# Patient Record
Sex: Male | Born: 1935
Health system: Southern US, Community
[De-identification: ages and names within clinical notes are randomized; demographics above are authoritative.]

## PROBLEM LIST (undated history)

## (undated) DIAGNOSIS — I129 Hypertensive chronic kidney disease with stage 1 through stage 4 chronic kidney disease, or unspecified chronic kidney disease: Secondary | ICD-10-CM

## (undated) DIAGNOSIS — R011 Cardiac murmur, unspecified: Secondary | ICD-10-CM

## (undated) DIAGNOSIS — A419 Sepsis, unspecified organism: Secondary | ICD-10-CM

## (undated) DIAGNOSIS — I38 Endocarditis, valve unspecified: Secondary | ICD-10-CM

## (undated) DIAGNOSIS — E213 Hyperparathyroidism, unspecified: Secondary | ICD-10-CM

## (undated) DIAGNOSIS — Z86010 Personal history of colonic polyps: Secondary | ICD-10-CM

## (undated) DIAGNOSIS — K922 Gastrointestinal hemorrhage, unspecified: Secondary | ICD-10-CM

## (undated) DIAGNOSIS — I10 Essential (primary) hypertension: Secondary | ICD-10-CM

## (undated) DIAGNOSIS — I4891 Unspecified atrial fibrillation: Secondary | ICD-10-CM

## (undated) DIAGNOSIS — Z860101 Personal history of adenomatous and serrated colon polyps: Secondary | ICD-10-CM

## (undated) DIAGNOSIS — K5790 Diverticulosis of intestine, part unspecified, without perforation or abscess without bleeding: Secondary | ICD-10-CM

## (undated) DIAGNOSIS — E785 Hyperlipidemia, unspecified: Secondary | ICD-10-CM

## (undated) DIAGNOSIS — M1990 Unspecified osteoarthritis, unspecified site: Secondary | ICD-10-CM

## (undated) DIAGNOSIS — M109 Gout, unspecified: Secondary | ICD-10-CM

## (undated) DIAGNOSIS — D649 Anemia, unspecified: Secondary | ICD-10-CM

## (undated) HISTORY — PX: MITRAL VALVE REPLACEMENT: SHX147

## (undated) HISTORY — PX: AORTIC AND MITRAL VALVE REPLACEMENT: SHX5056

## (undated) HISTORY — DX: Cardiac murmur, unspecified: R01.1

## (undated) HISTORY — PX: CATARACT EXTRACTION: SUR2

## (undated) HISTORY — DX: Anemia, unspecified: D64.9

## (undated) HISTORY — DX: Gastrointestinal hemorrhage, unspecified: K92.2

## (undated) HISTORY — DX: Personal history of colonic polyps: Z86.010

## (undated) HISTORY — DX: Personal history of adenomatous and serrated colon polyps: Z86.0101

## (undated) HISTORY — PX: CORONARY ARTERY BYPASS GRAFT: SHX141

## (undated) HISTORY — DX: Diverticulosis of intestine, part unspecified, without perforation or abscess without bleeding: K57.90

## (undated) HISTORY — PX: OTHER SURGICAL HISTORY: SHX169

## (undated) HISTORY — DX: Unspecified osteoarthritis, unspecified site: M19.90

## (undated) HISTORY — DX: Hypertensive chronic kidney disease with stage 1 through stage 4 chronic kidney disease, or unspecified chronic kidney disease: I12.9

## (undated) HISTORY — DX: Hyperparathyroidism, unspecified: E21.3

---

## 2001-02-05 ENCOUNTER — Other Ambulatory Visit: Admission: RE | Admit: 2001-02-05 | Discharge: 2001-02-05 | Payer: Self-pay | Admitting: Internal Medicine

## 2001-09-25 ENCOUNTER — Encounter: Admission: RE | Admit: 2001-09-25 | Discharge: 2001-09-25 | Payer: Self-pay | Admitting: Cardiology

## 2001-09-25 ENCOUNTER — Encounter: Payer: Self-pay | Admitting: Cardiology

## 2003-11-22 ENCOUNTER — Ambulatory Visit (HOSPITAL_COMMUNITY): Admission: RE | Admit: 2003-11-22 | Discharge: 2003-11-22 | Payer: Self-pay | Admitting: Cardiology

## 2003-12-02 ENCOUNTER — Ambulatory Visit (HOSPITAL_COMMUNITY): Admission: RE | Admit: 2003-12-02 | Discharge: 2003-12-02 | Payer: Self-pay | Admitting: Cardiology

## 2004-06-15 ENCOUNTER — Encounter (HOSPITAL_COMMUNITY): Admission: RE | Admit: 2004-06-15 | Discharge: 2004-09-13 | Payer: Self-pay | Admitting: Family Medicine

## 2004-06-20 ENCOUNTER — Ambulatory Visit (HOSPITAL_COMMUNITY): Admission: RE | Admit: 2004-06-20 | Discharge: 2004-06-20 | Payer: Self-pay | Admitting: Family Medicine

## 2005-11-18 ENCOUNTER — Ambulatory Visit: Payer: Self-pay | Admitting: Gastroenterology

## 2005-11-18 ENCOUNTER — Inpatient Hospital Stay (HOSPITAL_COMMUNITY): Admission: EM | Admit: 2005-11-18 | Discharge: 2005-11-25 | Payer: Self-pay | Admitting: Emergency Medicine

## 2006-02-11 ENCOUNTER — Emergency Department (HOSPITAL_COMMUNITY): Admission: EM | Admit: 2006-02-11 | Discharge: 2006-02-11 | Payer: Self-pay | Admitting: *Deleted

## 2006-02-14 ENCOUNTER — Ambulatory Visit: Payer: Self-pay | Admitting: Internal Medicine

## 2007-03-27 ENCOUNTER — Ambulatory Visit (HOSPITAL_COMMUNITY): Admission: RE | Admit: 2007-03-27 | Discharge: 2007-03-27 | Payer: Self-pay | Admitting: Cardiology

## 2009-11-07 DIAGNOSIS — I330 Acute and subacute infective endocarditis: Secondary | ICD-10-CM | POA: Insufficient documentation

## 2009-11-07 DIAGNOSIS — N2581 Secondary hyperparathyroidism of renal origin: Secondary | ICD-10-CM | POA: Diagnosis present

## 2010-01-18 DIAGNOSIS — I059 Rheumatic mitral valve disease, unspecified: Secondary | ICD-10-CM | POA: Insufficient documentation

## 2010-05-24 DIAGNOSIS — B009 Herpesviral infection, unspecified: Secondary | ICD-10-CM | POA: Insufficient documentation

## 2011-10-03 DIAGNOSIS — I2510 Atherosclerotic heart disease of native coronary artery without angina pectoris: Secondary | ICD-10-CM | POA: Diagnosis present

## 2011-10-03 DIAGNOSIS — I4891 Unspecified atrial fibrillation: Secondary | ICD-10-CM | POA: Diagnosis present

## 2011-10-03 DIAGNOSIS — M1A00X0 Idiopathic chronic gout, unspecified site, without tophus (tophi): Secondary | ICD-10-CM | POA: Diagnosis present

## 2012-01-07 DIAGNOSIS — Z954 Presence of other heart-valve replacement: Secondary | ICD-10-CM | POA: Diagnosis not present

## 2012-01-07 DIAGNOSIS — Z9889 Other specified postprocedural states: Secondary | ICD-10-CM | POA: Diagnosis not present

## 2012-01-07 DIAGNOSIS — Z7901 Long term (current) use of anticoagulants: Secondary | ICD-10-CM | POA: Diagnosis not present

## 2012-01-14 DIAGNOSIS — I10 Essential (primary) hypertension: Secondary | ICD-10-CM | POA: Diagnosis not present

## 2012-01-14 DIAGNOSIS — H2510 Age-related nuclear cataract, unspecified eye: Secondary | ICD-10-CM | POA: Diagnosis not present

## 2012-01-14 DIAGNOSIS — Z961 Presence of intraocular lens: Secondary | ICD-10-CM | POA: Diagnosis not present

## 2012-01-14 DIAGNOSIS — H35039 Hypertensive retinopathy, unspecified eye: Secondary | ICD-10-CM | POA: Diagnosis not present

## 2012-01-30 DIAGNOSIS — N183 Chronic kidney disease, stage 3 unspecified: Secondary | ICD-10-CM | POA: Diagnosis not present

## 2012-01-30 DIAGNOSIS — N2581 Secondary hyperparathyroidism of renal origin: Secondary | ICD-10-CM | POA: Diagnosis not present

## 2012-01-30 DIAGNOSIS — E213 Hyperparathyroidism, unspecified: Secondary | ICD-10-CM | POA: Diagnosis not present

## 2012-01-30 DIAGNOSIS — I129 Hypertensive chronic kidney disease with stage 1 through stage 4 chronic kidney disease, or unspecified chronic kidney disease: Secondary | ICD-10-CM | POA: Diagnosis not present

## 2012-01-30 DIAGNOSIS — D649 Anemia, unspecified: Secondary | ICD-10-CM | POA: Diagnosis not present

## 2012-01-30 DIAGNOSIS — M109 Gout, unspecified: Secondary | ICD-10-CM | POA: Diagnosis not present

## 2012-02-03 DIAGNOSIS — E785 Hyperlipidemia, unspecified: Secondary | ICD-10-CM | POA: Diagnosis not present

## 2012-02-03 DIAGNOSIS — Z125 Encounter for screening for malignant neoplasm of prostate: Secondary | ICD-10-CM | POA: Diagnosis not present

## 2012-02-10 DIAGNOSIS — Z7901 Long term (current) use of anticoagulants: Secondary | ICD-10-CM | POA: Diagnosis not present

## 2012-02-10 DIAGNOSIS — N189 Chronic kidney disease, unspecified: Secondary | ICD-10-CM | POA: Diagnosis not present

## 2012-02-10 DIAGNOSIS — Z0000 Encounter for general adult medical examination without abnormal findings: Secondary | ICD-10-CM | POA: Diagnosis not present

## 2012-02-10 DIAGNOSIS — Z125 Encounter for screening for malignant neoplasm of prostate: Secondary | ICD-10-CM | POA: Diagnosis not present

## 2012-02-10 DIAGNOSIS — I10 Essential (primary) hypertension: Secondary | ICD-10-CM | POA: Diagnosis not present

## 2012-02-10 DIAGNOSIS — E785 Hyperlipidemia, unspecified: Secondary | ICD-10-CM | POA: Diagnosis not present

## 2012-02-12 DIAGNOSIS — Z1212 Encounter for screening for malignant neoplasm of rectum: Secondary | ICD-10-CM | POA: Diagnosis not present

## 2012-02-13 DIAGNOSIS — N183 Chronic kidney disease, stage 3 unspecified: Secondary | ICD-10-CM | POA: Diagnosis not present

## 2012-02-27 DIAGNOSIS — E213 Hyperparathyroidism, unspecified: Secondary | ICD-10-CM | POA: Diagnosis not present

## 2012-02-27 DIAGNOSIS — N183 Chronic kidney disease, stage 3 unspecified: Secondary | ICD-10-CM | POA: Diagnosis not present

## 2012-03-11 DIAGNOSIS — I359 Nonrheumatic aortic valve disorder, unspecified: Secondary | ICD-10-CM | POA: Diagnosis not present

## 2012-03-11 DIAGNOSIS — I059 Rheumatic mitral valve disease, unspecified: Secondary | ICD-10-CM | POA: Diagnosis not present

## 2012-03-11 DIAGNOSIS — Z954 Presence of other heart-valve replacement: Secondary | ICD-10-CM | POA: Diagnosis not present

## 2012-03-11 DIAGNOSIS — Z7901 Long term (current) use of anticoagulants: Secondary | ICD-10-CM | POA: Diagnosis not present

## 2012-04-15 DIAGNOSIS — Z954 Presence of other heart-valve replacement: Secondary | ICD-10-CM | POA: Diagnosis not present

## 2012-04-15 DIAGNOSIS — I059 Rheumatic mitral valve disease, unspecified: Secondary | ICD-10-CM | POA: Diagnosis not present

## 2012-04-15 DIAGNOSIS — Z7901 Long term (current) use of anticoagulants: Secondary | ICD-10-CM | POA: Diagnosis not present

## 2012-04-15 DIAGNOSIS — I359 Nonrheumatic aortic valve disorder, unspecified: Secondary | ICD-10-CM | POA: Diagnosis not present

## 2012-04-28 DIAGNOSIS — I059 Rheumatic mitral valve disease, unspecified: Secondary | ICD-10-CM | POA: Diagnosis not present

## 2012-04-28 DIAGNOSIS — Z954 Presence of other heart-valve replacement: Secondary | ICD-10-CM | POA: Diagnosis not present

## 2012-04-28 DIAGNOSIS — I359 Nonrheumatic aortic valve disorder, unspecified: Secondary | ICD-10-CM | POA: Diagnosis not present

## 2012-04-28 DIAGNOSIS — Z7901 Long term (current) use of anticoagulants: Secondary | ICD-10-CM | POA: Diagnosis not present

## 2012-05-27 ENCOUNTER — Emergency Department (HOSPITAL_BASED_OUTPATIENT_CLINIC_OR_DEPARTMENT_OTHER): Payer: Medicare Other

## 2012-05-27 ENCOUNTER — Encounter (HOSPITAL_BASED_OUTPATIENT_CLINIC_OR_DEPARTMENT_OTHER): Payer: Self-pay | Admitting: *Deleted

## 2012-05-27 ENCOUNTER — Emergency Department (HOSPITAL_BASED_OUTPATIENT_CLINIC_OR_DEPARTMENT_OTHER)
Admission: EM | Admit: 2012-05-27 | Discharge: 2012-05-27 | Disposition: A | Discharge status: Home / Self Care | Payer: Medicare Other | Attending: Emergency Medicine | Admitting: Emergency Medicine

## 2012-05-27 DIAGNOSIS — R110 Nausea: Secondary | ICD-10-CM | POA: Diagnosis not present

## 2012-05-27 DIAGNOSIS — R42 Dizziness and giddiness: Secondary | ICD-10-CM | POA: Diagnosis not present

## 2012-05-27 DIAGNOSIS — Z954 Presence of other heart-valve replacement: Secondary | ICD-10-CM | POA: Diagnosis not present

## 2012-05-27 DIAGNOSIS — I10 Essential (primary) hypertension: Secondary | ICD-10-CM | POA: Diagnosis not present

## 2012-05-27 DIAGNOSIS — E785 Hyperlipidemia, unspecified: Secondary | ICD-10-CM | POA: Diagnosis not present

## 2012-05-27 DIAGNOSIS — Z951 Presence of aortocoronary bypass graft: Secondary | ICD-10-CM | POA: Diagnosis not present

## 2012-05-27 HISTORY — DX: Hyperlipidemia, unspecified: E78.5

## 2012-05-27 HISTORY — DX: Gout, unspecified: M10.9

## 2012-05-27 HISTORY — DX: Essential (primary) hypertension: I10

## 2012-05-27 HISTORY — DX: Endocarditis, valve unspecified: I38

## 2012-05-27 LAB — CBC
HCT: 37.8 % — ABNORMAL LOW (ref 39.0–52.0)
Hemoglobin: 13.2 g/dL (ref 13.0–17.0)
MCH: 32.8 pg (ref 26.0–34.0)
MCHC: 34.9 g/dL (ref 30.0–36.0)
MCV: 94 fL (ref 78.0–100.0)
Platelets: 256 10*3/uL (ref 150–400)
RBC: 4.02 MIL/uL — ABNORMAL LOW (ref 4.22–5.81)
RDW: 14.2 % (ref 11.5–15.5)
WBC: 7 10*3/uL (ref 4.0–10.5)

## 2012-05-27 LAB — COMPREHENSIVE METABOLIC PANEL
ALT: 14 U/L (ref 0–53)
AST: 27 U/L (ref 0–37)
Albumin: 4.6 g/dL (ref 3.5–5.2)
Alkaline Phosphatase: 67 U/L (ref 39–117)
BUN: 35 mg/dL — ABNORMAL HIGH (ref 6–23)
CO2: 28 mEq/L (ref 19–32)
Calcium: 10.4 mg/dL (ref 8.4–10.5)
Chloride: 100 mEq/L (ref 96–112)
Creatinine, Ser: 1.6 mg/dL — ABNORMAL HIGH (ref 0.50–1.35)
GFR calc Af Amer: 45 mL/min — ABNORMAL LOW (ref >90)
GFR calc non Af Amer: 39 mL/min — ABNORMAL LOW (ref >90)
Glucose, Bld: 128 mg/dL — ABNORMAL HIGH (ref 70–99)
Potassium: 3.9 mEq/L (ref 3.5–5.1)
Sodium: 140 mEq/L (ref 135–145)
Total Bilirubin: 0.5 mg/dL (ref 0.3–1.2)
Total Protein: 8 g/dL (ref 6.0–8.3)

## 2012-05-27 LAB — TROPONIN I: Troponin I: <0.3 ng/mL (ref <0.30)

## 2012-05-27 LAB — PROTIME-INR
INR: 2.65 — ABNORMAL HIGH (ref 0.00–1.49)
Prothrombin Time: 28.7 seconds — ABNORMAL HIGH (ref 11.6–15.2)

## 2012-05-27 MED ORDER — MECLIZINE HCL 25 MG PO TABS
25.0000 mg | ORAL_TABLET | Freq: Once | ORAL | Status: AC
  Administered 2012-05-27: 25 mg via ORAL
  Filled 2012-05-27: qty 1

## 2012-05-27 MED ORDER — MECLIZINE HCL 25 MG PO TABS: 25.0000 mg | ORAL_TABLET | Freq: Three times a day (TID) | ORAL | Status: AC | PRN

## 2012-05-27 MED ORDER — MECLIZINE HCL 12.5 MG PO TABS: 12.5000 mg | ORAL_TABLET | Freq: Three times a day (TID) | ORAL | Status: DC | PRN

## 2012-05-27 NOTE — ED Provider Notes (Addendum)
History     CSN: [id number]  Arrival date & time 05/27/12  1306   First MD Initiated Contact with Patient 05/27/12 1309      Chief Complaint  Patient presents with  . Dizziness  . Nausea    (Consider location/radiation/quality/duration/timing/severity/associated sxs/prior treatment) HPI Comments: Patient was walking into house this afternoon, then became acutely dizzy.  He felt as though he was spinning.  It was associated with nausea.  He denies headache, fever, chills.  No palpitations or chest pain.  The symptoms are worsened by turning head and movement, improved with lying still.  He has a history of heart valve replacements and is on coumadin for this.  A ladder did fall on his head a few weeks ago but has been fine.  The history is provided by the patient.    Past Medical History  Diagnosis Date  . Endocarditis   . Gout   . Hyperlipemia   . Hypertension     Past Surgical History  Procedure Date  . Coronary artery bypass graft   . Mitral valve replacement   . Aortic and mitral valve replacement   . Cataract extraction     History reviewed. No pertinent family history.  History  Substance Use Topics  . Smoking status: Former Research scientist (life sciences)  . Smokeless tobacco: Not on file  . Alcohol Use: No      Review of Systems  All other systems reviewed and are negative.    Allergies  Review of patient's allergies indicates no known allergies.  Home Medications   Current Outpatient Rx  Name Route Sig Dispense Refill  . ALLOPURINOL 300 MG PO TABS Oral Take 300 mg by mouth daily.    . BUMETANIDE 2 MG PO TABS Oral Take 2 mg by mouth 2 (two) times daily.    Marland Kitchen CETIRIZINE HCL 10 MG PO TABS Oral Take 10 mg by mouth daily.    Marland Kitchen EPINEPHRINE 0.15 MG/0.3ML IJ DEVI Intramuscular Inject 0.15 mg into the muscle as needed.    Marland Kitchen GEMFIBROZIL 600 MG PO TABS Oral Take 600 mg by mouth 2 (two) times daily.    Marland Kitchen LOSARTAN POTASSIUM 100 MG PO TABS Oral Take 100 mg by mouth daily.    Marland Kitchen  METOPROLOL SUCCINATE ER 25 MG PO TB24 Oral Take 25 mg by mouth daily.    . MULTI-VITAMIN/MINERALS PO TABS Oral Take 1 tablet by mouth daily.    Marland Kitchen POTASSIUM CHLORIDE CRYS ER 20 MEQ PO TBCR Oral Take 20 mEq by mouth 2 (two) times daily.    Marland Kitchen PRAVASTATIN SODIUM 40 MG PO TABS Oral Take 40 mg by mouth daily.    . WARFARIN SODIUM 5 MG PO TABS Oral Take 5 mg by mouth daily.    . WARFARIN SODIUM 7.5 MG PO TABS Oral Take 7.5 mg by mouth daily.      BP 130/66  Pulse 63  Temp(Src) 98.5 F (36.9 C) (Oral)  Resp 18  Ht 5\' 7"  (1.702 m)  Wt 197 lb (89.359 kg)  BMI 30.85 kg/m2  SpO2 98%  Physical Exam  Nursing note and vitals reviewed. Constitutional: He is oriented to person, place, and time. He appears well-developed and well-nourished. No distress.  HENT:  Head: Normocephalic and atraumatic.  Right Ear: External ear normal.  Left Ear: External ear normal.  Mouth/Throat: Oropharynx is clear and moist.  Eyes: EOM are normal. Pupils are equal, round, and reactive to light.  Neck: Normal range of motion. Neck supple.  Cardiovascular:  Normal rate and regular rhythm.   No murmur heard. Pulmonary/Chest: Effort normal and breath sounds normal. No respiratory distress. He has no wheezes.  Abdominal: Soft. Bowel sounds are normal.  Musculoskeletal: Normal range of motion. He exhibits no edema.  Neurological: He is alert and oriented to person, place, and time. No cranial nerve deficit. He exhibits normal muscle tone. Coordination normal.  Skin: Skin is warm and dry. He is not diaphoretic.    ED Course  Procedures (including critical care time)   Labs Reviewed  CBC  COMPREHENSIVE METABOLIC PANEL  TROPONIN I  PROTIME-INR   No results found.   No diagnosis found.   Date: 05/27/2012  Rate: 63  Rhythm: normal sinus rhythm  QRS Axis: normal  Intervals: normal  ST/T Wave abnormalities: normal  Conduction Disutrbances:none  Narrative Interpretation:   Old EKG Reviewed: none  available    MDM  The labs, ekg, and ct scan all look okay without acute process and the symptoms are most consistent with a peripheral etiology rather than central.  The ct scan does show a small calcified meningioma that I believe is an incidental finding.  He will be discharged with meclizine and follow up with his pcp.  To return if worsens.          Veryl Speak, MD 05/27/12 1448  Veryl Speak, MD 05/27/12 [phone]

## 2012-05-27 NOTE — ED Notes (Signed)
Wife states pt came  into house x 1 hr ago with dizziness and unsteady gait, wife denied slurred speech, pt states only dizzy

## 2012-05-27 NOTE — Discharge Instructions (Signed)

## 2012-05-28 LAB — GLUCOSE, CAPILLARY: Glucose-Capillary: 131 mg/dL — ABNORMAL HIGH (ref 70–99)

## 2012-06-03 DIAGNOSIS — E785 Hyperlipidemia, unspecified: Secondary | ICD-10-CM | POA: Diagnosis not present

## 2012-06-03 DIAGNOSIS — I10 Essential (primary) hypertension: Secondary | ICD-10-CM | POA: Diagnosis not present

## 2012-06-03 DIAGNOSIS — Z8679 Personal history of other diseases of the circulatory system: Secondary | ICD-10-CM | POA: Diagnosis not present

## 2012-06-03 DIAGNOSIS — I2510 Atherosclerotic heart disease of native coronary artery without angina pectoris: Secondary | ICD-10-CM | POA: Diagnosis not present

## 2012-06-04 DIAGNOSIS — I359 Nonrheumatic aortic valve disorder, unspecified: Secondary | ICD-10-CM | POA: Diagnosis not present

## 2012-06-04 DIAGNOSIS — Z954 Presence of other heart-valve replacement: Secondary | ICD-10-CM | POA: Diagnosis not present

## 2012-06-04 DIAGNOSIS — Z7901 Long term (current) use of anticoagulants: Secondary | ICD-10-CM | POA: Diagnosis not present

## 2012-06-04 DIAGNOSIS — I059 Rheumatic mitral valve disease, unspecified: Secondary | ICD-10-CM | POA: Diagnosis not present

## 2012-06-26 DIAGNOSIS — D649 Anemia, unspecified: Secondary | ICD-10-CM | POA: Diagnosis not present

## 2012-06-26 DIAGNOSIS — E213 Hyperparathyroidism, unspecified: Secondary | ICD-10-CM | POA: Diagnosis not present

## 2012-06-26 DIAGNOSIS — N2581 Secondary hyperparathyroidism of renal origin: Secondary | ICD-10-CM | POA: Diagnosis not present

## 2012-06-26 DIAGNOSIS — I129 Hypertensive chronic kidney disease with stage 1 through stage 4 chronic kidney disease, or unspecified chronic kidney disease: Secondary | ICD-10-CM | POA: Diagnosis not present

## 2012-06-26 DIAGNOSIS — N183 Chronic kidney disease, stage 3 unspecified: Secondary | ICD-10-CM | POA: Diagnosis not present

## 2012-06-26 DIAGNOSIS — M109 Gout, unspecified: Secondary | ICD-10-CM | POA: Diagnosis not present

## 2012-07-07 DIAGNOSIS — H2510 Age-related nuclear cataract, unspecified eye: Secondary | ICD-10-CM | POA: Diagnosis not present

## 2012-07-07 DIAGNOSIS — H35039 Hypertensive retinopathy, unspecified eye: Secondary | ICD-10-CM | POA: Diagnosis not present

## 2012-07-07 DIAGNOSIS — E119 Type 2 diabetes mellitus without complications: Secondary | ICD-10-CM | POA: Diagnosis not present

## 2012-07-07 DIAGNOSIS — Z961 Presence of intraocular lens: Secondary | ICD-10-CM | POA: Diagnosis not present

## 2012-07-09 DIAGNOSIS — I359 Nonrheumatic aortic valve disorder, unspecified: Secondary | ICD-10-CM | POA: Diagnosis not present

## 2012-07-09 DIAGNOSIS — Z7901 Long term (current) use of anticoagulants: Secondary | ICD-10-CM | POA: Diagnosis not present

## 2012-07-09 DIAGNOSIS — Z9889 Other specified postprocedural states: Secondary | ICD-10-CM | POA: Diagnosis not present

## 2012-07-09 DIAGNOSIS — I059 Rheumatic mitral valve disease, unspecified: Secondary | ICD-10-CM | POA: Diagnosis not present

## 2012-08-07 ENCOUNTER — Telehealth: Payer: Self-pay | Admitting: Internal Medicine

## 2012-08-07 DIAGNOSIS — Z7901 Long term (current) use of anticoagulants: Secondary | ICD-10-CM | POA: Diagnosis not present

## 2012-08-07 DIAGNOSIS — Z954 Presence of other heart-valve replacement: Secondary | ICD-10-CM | POA: Diagnosis not present

## 2012-08-07 DIAGNOSIS — I10 Essential (primary) hypertension: Secondary | ICD-10-CM | POA: Diagnosis not present

## 2012-08-07 DIAGNOSIS — K921 Melena: Secondary | ICD-10-CM | POA: Diagnosis not present

## 2012-08-07 NOTE — Telephone Encounter (Signed)
Spoke with Peter Congo and scheduled patient on 08/11/12 at 2:00 PM. Peter Congo to fax records.

## 2012-08-11 ENCOUNTER — Encounter: Payer: Self-pay | Admitting: Nurse Practitioner

## 2012-08-11 ENCOUNTER — Other Ambulatory Visit (INDEPENDENT_AMBULATORY_CARE_PROVIDER_SITE_OTHER): Payer: Medicare Other

## 2012-08-11 ENCOUNTER — Ambulatory Visit (INDEPENDENT_AMBULATORY_CARE_PROVIDER_SITE_OTHER): Payer: Medicare Other | Admitting: Nurse Practitioner

## 2012-08-11 VITALS — BP 122/70 | HR 74 | Ht 67.0 in | Wt 200.8 lb

## 2012-08-11 DIAGNOSIS — Z7901 Long term (current) use of anticoagulants: Secondary | ICD-10-CM

## 2012-08-11 DIAGNOSIS — R195 Other fecal abnormalities: Secondary | ICD-10-CM

## 2012-08-11 LAB — CBC WITH DIFFERENTIAL/PLATELET
Basophils Absolute: 0.1 10*3/uL (ref 0.0–0.1)
Basophils Relative: 0.9 % (ref 0.0–3.0)
Eosinophils Absolute: 0.1 10*3/uL (ref 0.0–0.7)
Eosinophils Relative: 1.6 % (ref 0.0–5.0)
HCT: 36.4 % — ABNORMAL LOW (ref 39.0–52.0)
Hemoglobin: 12 g/dL — ABNORMAL LOW (ref 13.0–17.0)
Lymphocytes Relative: 21 % (ref 12.0–46.0)
Lymphs Abs: 1.5 10*3/uL (ref 0.7–4.0)
MCHC: 32.8 g/dL (ref 30.0–36.0)
MCV: 98.2 fl (ref 78.0–100.0)
Monocytes Absolute: 0.5 10*3/uL (ref 0.1–1.0)
Monocytes Relative: 6.7 % (ref 3.0–12.0)
Neutro Abs: 5.1 10*3/uL (ref 1.4–7.7)
Neutrophils Relative %: 69.8 % (ref 43.0–77.0)
Platelets: 235 10*3/uL (ref 150.0–400.0)
RBC: 3.71 Mil/uL — ABNORMAL LOW (ref 4.22–5.81)
RDW: 14.8 % — ABNORMAL HIGH (ref 11.5–14.6)
WBC: 7.3 10*3/uL (ref 4.5–10.5)

## 2012-08-11 NOTE — Patient Instructions (Addendum)
Please go to the basement level to have your labs drawn.  We scheduled the Endoscopy with Dr. Olevia Perches on Friday 08-14-2012. Directions provided. Upper GI Endoscopy Upper GI endoscopy means using a flexible scope to look at the esophagus, stomach, and upper small bowel. This is done to make a diagnosis in people with heartburn, abdominal pain, or abnormal bleeding. Sometimes an endoscope is needed to remove foreign bodies or food that become stuck in the esophagus; it can also be used to take biopsy samples. For the best results, do not eat or drink for 8 hours before having your upper endoscopy.  To perform the endoscopy, you will probably be sedated and your throat will be numbed with a special spray. The endoscope is then slowly passed down your throat (this will not interfere with your breathing). An endoscopy exam takes 15 to 30 minutes to complete and there is no real pain. Patients rarely remember much about the procedure. The results of the test may take several days if a biopsy or other test is taken.  You may have a sore throat after an endoscopy exam. Serious complications are very rare. Stick to liquids and soft foods until your pain is better. Do not drive a car or operate any dangerous equipment for at least 24 hours after being sedated. SEEK IMMEDIATE MEDICAL CARE IF:   You have severe throat pain.   You have shortness of breath.   You have bleeding problems.   You have a fever.   You have difficulty recovering from your sedation.  Document Released: 01/16/2005 Document Revised: 11/28/2011 Document Reviewed: 12/11/2008 [facility] Patient Information 2012 [place].

## 2012-08-11 NOTE — Progress Notes (Signed)
08/11/2012 Steve Riddle [id number] Jun 16, 1929   HISTORY OF PRESENT ILLNESS: Patient is an 78 year old male, on chronic Coumadin, here for evaluation of blood in stool. Patient is known to Dr. Olevia Perches for a history of adenomatous colon polyps. His last surveillance colonoscopy was February 2004 with findings of multiple colon polyps and diverticulosis.    On Friday 08/07/12 patient passed a black stool. He saw PCP and was heme positive on exam. His INR was 2.9 in their office. No abdominal pain, no nausea or vomiting. No NSAID use. No bismuth or iron products. No black stools since that one time.   Past Medical History  Diagnosis Date  . Endocarditis   . Gout   . Hyperlipemia   . Hypertension   . Arthritis   . Heart murmur    Past Surgical History  Procedure Date  . Coronary artery bypass graft   . Mitral valve replacement   . Aortic and mitral valve replacement   . Cataract extraction     reports that he has quit smoking. He does not have any smokeless tobacco history on file. He reports that he does not drink alcohol or use illicit drugs. family history includes Breast cancer in his daughter; Hypertension in his father and mother; Stroke in his mother; and Uterine cancer in his daughter. No Known Allergies    Outpatient Encounter Prescriptions as of 08/11/2012  Medication Sig Dispense Refill  . allopurinol (ZYLOPRIM) 300 MG tablet Take 300 mg by mouth daily.      . bumetanide (BUMEX) 2 MG tablet Take 2 mg by mouth 2 (two) times daily.      . cetirizine (ZYRTEC) 10 MG tablet Take 10 mg by mouth daily.      Marland Kitchen EPINEPHrine (EPIPEN JR) 0.15 MG/0.3ML injection Inject 0.15 mg into the muscle as needed.      Marland Kitchen gemfibrozil (LOPID) 600 MG tablet Take 600 mg by mouth 2 (two) times daily.      Marland Kitchen losartan (COZAAR) 100 MG tablet Take 100 mg by mouth daily.      . meclizine (ANTIVERT) 25 MG tablet Take 25 mg by mouth 3 (three) times daily as needed.      . metoprolol succinate (TOPROL-XL) 25 MG  24 hr tablet Take 25 mg by mouth daily.      . Multiple Vitamins-Minerals (MULTIVITAMIN WITH MINERALS) tablet Take 1 tablet by mouth daily.      . potassium chloride SA (K-DUR,KLOR-CON) 20 MEQ tablet Take 20 mEq by mouth 2 (two) times daily.      . pravastatin (PRAVACHOL) 40 MG tablet Take 40 mg by mouth daily.      Marland Kitchen warfarin (COUMADIN) 5 MG tablet Take 5 mg by mouth daily.      Marland Kitchen warfarin (COUMADIN) 7.5 MG tablet Take 7.5 mg by mouth daily.         REVIEW OF SYSTEMS  : Positive for arthritis, heart murmur, swelling of feet, excessive urination. All other systems reviewed and negative except where noted in the History of Present Illness.   PHYSICAL EXAM: BP 122/70  Pulse 74  Ht 5\' 7"  (1.702 m)  Wt 200 lb 12.8 oz (91.082 kg)  BMI 31.45 kg/m2  SpO2 98% General: Well developed white male in no acute distress Head: Normocephalic and atraumatic Eyes:  sclerae anicteric,conjunctive pink. Ears: Normal auditory acuity Mouth: No deformity or lesions Neck: Supple, no masses.  Lungs: Clear throughout to auscultation Heart: Regular rate and rhythm Abdomen: Soft, non distended, nontender. No masses  or hepatomegaly noted. Normal Bowel sounds Rectal: Not repeated today Musculoskeletal: Symmetrical with no gross deformities  Skin: No lesions on visible extremities Extremities: No edema or deformities noted Neurological: Alert oriented x 4, grossly nonfocal Cervical Nodes:  No significant cervical adenopathy Psychological:  Alert and cooperative. Normal mood and affect  ASSESSMENT AND PLAN:  1.  Possible UGI bleed with passage of a black, heme + stool several days ago, none since. No associated abdominal pain, nausea or vomiting. In 2006 patient had an upper endoscopy for melena. Findings included gastritis. Then, in 2008 patient had workup for anemia and black stools (on iron)  at [facility] where his daughter is employed (patient was not able to get into our office quickly enough). It  sounds like he had upper endoscopy, colonoscopy and small bowel camera study, all with negative findings. For recurrent melena, patient needs repeat upper endoscopy. Patient is on chronic anticoagulation for 2 mechanical valves. Diagnostic upper endoscopy will be done on Coumadin with the understanding that patient could require a second procedure using a heparin bridge depending on endoscopic findings. The benefits, risks, and potential complications of EGD with possible biopsies were discussed with the patient and he agrees to proceed. Will check CBC today.  2. history of adenomatous colon polyps 2002 and 2004. Will defer to Dr. Olevia Perches, patient's primary GI, regarding feasibility of future surveillance colonoscopies. Patient would probably need to go on Lovenox bridge for procedure.   3 Heart valve replacement. Patient has 2 prosthetic valves and is on lifelong coumadin.

## 2012-08-13 NOTE — Progress Notes (Signed)
Case discussed with PG,NP. Agree with initial assessment and plans for diagnostic EGD on coumadin. Thereafter, Dr Olevia Perches can address the need for follow up colonoscopy

## 2012-08-14 ENCOUNTER — Ambulatory Visit (AMBULATORY_SURGERY_CENTER): Payer: Medicare Other | Admitting: Internal Medicine

## 2012-08-14 ENCOUNTER — Other Ambulatory Visit: Payer: Self-pay | Admitting: Internal Medicine

## 2012-08-14 ENCOUNTER — Encounter: Payer: Self-pay | Admitting: Internal Medicine

## 2012-08-14 ENCOUNTER — Other Ambulatory Visit (INDEPENDENT_AMBULATORY_CARE_PROVIDER_SITE_OTHER): Payer: Medicare Other

## 2012-08-14 VITALS — BP 125/50 | HR 55 | Temp 95.6°F | Resp 20 | Ht 67.0 in | Wt 200.0 lb

## 2012-08-14 DIAGNOSIS — K921 Melena: Secondary | ICD-10-CM | POA: Diagnosis not present

## 2012-08-14 DIAGNOSIS — R195 Other fecal abnormalities: Secondary | ICD-10-CM | POA: Diagnosis not present

## 2012-08-14 DIAGNOSIS — D1330 Benign neoplasm of unspecified part of small intestine: Secondary | ICD-10-CM | POA: Diagnosis not present

## 2012-08-14 DIAGNOSIS — Z7901 Long term (current) use of anticoagulants: Secondary | ICD-10-CM

## 2012-08-14 LAB — CBC WITH DIFFERENTIAL/PLATELET
Basophils Absolute: 0 10*3/uL (ref 0.0–0.1)
Basophils Relative: 0.3 % (ref 0.0–3.0)
Eosinophils Absolute: 0.1 10*3/uL (ref 0.0–0.7)
Eosinophils Relative: 2.1 % (ref 0.0–5.0)
HCT: 35.2 % — ABNORMAL LOW (ref 39.0–52.0)
Hemoglobin: 11.6 g/dL — ABNORMAL LOW (ref 13.0–17.0)
Lymphocytes Relative: 20 % (ref 12.0–46.0)
Lymphs Abs: 1.1 10*3/uL (ref 0.7–4.0)
MCHC: 32.9 g/dL (ref 30.0–36.0)
MCV: 98.1 fl (ref 78.0–100.0)
Monocytes Absolute: 0.4 10*3/uL (ref 0.1–1.0)
Monocytes Relative: 7.1 % (ref 3.0–12.0)
Neutro Abs: 4 10*3/uL (ref 1.4–7.7)
Neutrophils Relative %: 70.5 % (ref 43.0–77.0)
Platelets: 207 10*3/uL (ref 150.0–400.0)
RBC: 3.59 Mil/uL — ABNORMAL LOW (ref 4.22–5.81)
RDW: 15.3 % — ABNORMAL HIGH (ref 11.5–14.6)
WBC: 5.7 10*3/uL (ref 4.5–10.5)

## 2012-08-14 LAB — PROTIME-INR
INR: 2.7 ratio — ABNORMAL HIGH (ref 0.8–1.0)
Prothrombin Time: 29.5 s — ABNORMAL HIGH (ref 10.2–12.4)

## 2012-08-14 MED ORDER — OMEPRAZOLE 20 MG PO CPDR: 20.0000 mg | DELAYED_RELEASE_CAPSULE | Freq: Every day | ORAL | Status: DC

## 2012-08-14 MED ORDER — SODIUM CHLORIDE 0.9 % IV SOLN: 500.0000 mL | INTRAVENOUS | Status: DC

## 2012-08-14 NOTE — Progress Notes (Signed)
Patient did not have preoperative order for IV antibiotic SSI prophylaxis. (G8918)   

## 2012-08-14 NOTE — Op Note (Signed)
[place]  [address] [place], [address]   ENDOSCOPY PROCEDURE REPORT  PATIENT: Steve, Riddle  MR#: [id number] BIRTHDATE: [birth year] , 83  yrs. old GENDER: Male ENDOSCOPIST: Lafayette Dragon, MD REFERRED BY:  Leanna Battles, M.D. PROCEDURE DATE:  08/14/2012 PROCEDURE:  EGD w/ biopsy ASA CLASS:     Class III INDICATIONS:  melena.   heme positive stool.   pt on Coumadin for 2 prosthetic valves, INR 2.9, had 2 black stools, Hgb 12.0. MEDICATIONS: These medications were titrated to patient response per physician's verbal order, Versed 2 mg IV, and Fentanyl 50 mcg IV TOPICAL ANESTHETIC: Cetacaine Spray  DESCRIPTION OF PROCEDURE: After the risks benefits and alternatives of the procedure were thoroughly explained, informed consent was obtained.  The LB [id number]-H180 [id number] endoscope was introduced through the mouth and advanced to the second portion of the duodenum. Without limitations.  The instrument was slowly withdrawn as the mucosa was fully examined.      DUODENUM: Mild duodenal inflammation was found in the bulb and second portion of the duodenum.   Biopsies of the duodenal mucosa to r/o H.pylori. Mild erythema and granularity of the bulb and descending duodenum. Retroflexed views revealed no abnormalities.     The scope was then withdrawn from the patient and the procedure completed.  COMPLICATIONS: There were no complications. ENDOSCOPIC IMPRESSION: 1.   Duodenal inflammation was found in the bulb and second portion of the duodenum 2.   Biopsies of the duodenal mucosa to r/o H.pylori 3 .  otherwise normal exam of the esophagus and the stomach  RECOMMENDATIONS: 1.  Await pathology results 2.  continue PPI 3.  My office will arrange for you to have labs checked (CBC).,PT today 4.   continue Coumadin  :  eSigned:  Lafayette Dragon, MD 08/14/2012 8:27 AM   CC:  PATIENT NAME:  Steve, Riddle MR#: [id number]

## 2012-08-14 NOTE — Patient Instructions (Addendum)
YOU HAD AN ENDOSCOPIC PROCEDURE TODAY AT [facility]: Refer to the procedure report that was given to you for any specific questions about what was found during the examination.  If the procedure report does not answer your questions, please call your gastroenterologist to clarify.  If you requested that your care partner not be given the details of your procedure findings, then the procedure report has been included in a sealed envelope for you to review at your convenience later.   DIET: Your first meal following the procedure should be a light meal and then it is ok to progress to your normal diet.  A half-sandwich or bowl of soup is an example of a good first meal.  Heavy or fried foods are harder to digest and may make you feel nauseous or bloated.  Likewise meals heavy in dairy and vegetables can cause extra gas to form and this can also increase the bloating.  Drink plenty of fluids but you should avoid alcoholic beverages for 24 hours.  ACTIVITY: Your care partner should take you home directly after the procedure.  You should plan to take it easy, moving slowly for the rest of the day.  You can resume normal activity the day after the procedure however you should NOT DRIVE or use heavy machinery for 24 hours (because of the sedation medicines used during the test).    SYMPTOMS TO REPORT IMMEDIATELY: A gastroenterologist can be reached at any hour.  During normal business hours, 8:30 AM to 5:00 PM Monday through Friday, call [phone].  After hours and on weekends, please call the GI answering service at [phone] who will take a message and have the physician on call contact you.   Following upper endoscopy (EGD)  Vomiting of blood or coffee ground material  New chest pain or pain under the shoulder blades  Painful or persistently difficult swallowing  New shortness of breath  Fever of 100F or higher  Black, tarry-looking stools  FOLLOW UP: If any biopsies were  taken you will be contacted by phone or by letter within the next 1-3 weeks.  Call your gastroenterologist if you have not heard about the biopsies in 3 weeks.  Our staff will call the home number listed on your records the next business day following your procedure to check on you and address any questions or concerns that you may have at that time regarding the information given to you following your procedure. This is a courtesy call and so if there is no answer at the home number and we have not heard from you through the emergency physician on call, we will assume that you have returned to your regular daily activities without incident.  SIGNATURES/CONFIDENTIALITY: You and/or your care partner have signed paperwork which will be entered into your electronic medical record.  These signatures attest to the fact that that the information above on your After Visit Summary has been reviewed and is understood.  Full responsibility of the confidentiality of this discharge information lies with you and/or your care-partner.   Take your prilosec as prescribed per Dr. Olevia Perches.  You may resume your coumadin today.  Please, go to the lab today.   A CBC and PT will be drawn at that time.  Thank-you for choosing [place] for your healthcare needs.

## 2012-08-17 ENCOUNTER — Telehealth: Payer: Self-pay | Admitting: *Deleted

## 2012-08-17 NOTE — Telephone Encounter (Signed)
  Follow up Call-  Call back number 08/14/2012  Post procedure Call Back phone  # [phone]  Permission to leave phone message Yes     [facility]

## 2012-08-19 ENCOUNTER — Encounter: Payer: Self-pay | Admitting: Internal Medicine

## 2012-08-24 ENCOUNTER — Encounter: Payer: Self-pay | Admitting: Internal Medicine

## 2012-08-25 ENCOUNTER — Other Ambulatory Visit (INDEPENDENT_AMBULATORY_CARE_PROVIDER_SITE_OTHER): Payer: Medicare Other

## 2012-08-25 ENCOUNTER — Telehealth: Payer: Self-pay | Admitting: *Deleted

## 2012-08-25 DIAGNOSIS — Z7901 Long term (current) use of anticoagulants: Secondary | ICD-10-CM

## 2012-08-25 DIAGNOSIS — R195 Other fecal abnormalities: Secondary | ICD-10-CM | POA: Diagnosis not present

## 2012-08-25 DIAGNOSIS — K921 Melena: Secondary | ICD-10-CM

## 2012-08-25 DIAGNOSIS — D649 Anemia, unspecified: Secondary | ICD-10-CM

## 2012-08-25 LAB — CBC WITH DIFFERENTIAL/PLATELET
Basophils Absolute: 0 10*3/uL (ref 0.0–0.1)
Basophils Relative: 0.3 % (ref 0.0–3.0)
Eosinophils Absolute: 0.1 10*3/uL (ref 0.0–0.7)
Eosinophils Relative: 1.6 % (ref 0.0–5.0)
HCT: 32.1 % — ABNORMAL LOW (ref 39.0–52.0)
Hemoglobin: 10.7 g/dL — ABNORMAL LOW (ref 13.0–17.0)
Lymphocytes Relative: 20.1 % (ref 12.0–46.0)
Lymphs Abs: 1.4 10*3/uL (ref 0.7–4.0)
MCHC: 33.4 g/dL (ref 30.0–36.0)
MCV: 97.8 fl (ref 78.0–100.0)
Monocytes Absolute: 0.4 10*3/uL (ref 0.1–1.0)
Monocytes Relative: 6.3 % (ref 3.0–12.0)
Neutro Abs: 5 10*3/uL (ref 1.4–7.7)
Neutrophils Relative %: 71.7 % (ref 43.0–77.0)
Platelets: 223 10*3/uL (ref 150.0–400.0)
RBC: 3.28 Mil/uL — ABNORMAL LOW (ref 4.22–5.81)
RDW: 15 % — ABNORMAL HIGH (ref 11.5–14.6)
WBC: 7 10*3/uL (ref 4.5–10.5)

## 2012-08-25 LAB — BASIC METABOLIC PANEL
BUN: 38 mg/dL — ABNORMAL HIGH (ref 6–23)
CO2: 30 mEq/L (ref 19–32)
Calcium: 9.8 mg/dL (ref 8.4–10.5)
Chloride: 99 mEq/L (ref 96–112)
Creatinine, Ser: 1.8 mg/dL — ABNORMAL HIGH (ref 0.4–1.5)
GFR: 38.38 mL/min — ABNORMAL LOW (ref >60.00)
Glucose, Bld: 97 mg/dL (ref 70–99)
Potassium: 3.8 mEq/L (ref 3.5–5.1)
Sodium: 139 mEq/L (ref 135–145)

## 2012-08-25 LAB — PROTIME-INR
INR: 4.3 ratio — ABNORMAL HIGH (ref 0.8–1.0)
Prothrombin Time: 47.5 s — ABNORMAL HIGH (ref 10.2–12.4)

## 2012-08-25 NOTE — Telephone Encounter (Signed)
Patient left a message to be called about EGD results. Gave patient results. He reports a black stool on Sunday and Monday. He is sure he is still bleeding somewhere. Stool was dark brown today. Please, advise.

## 2012-08-25 NOTE — Telephone Encounter (Signed)
Please obtain CBC, PTime, B=met today. Consider colonoscopy ON Coumadin, last exam 2004.

## 2012-08-25 NOTE — Telephone Encounter (Signed)
Patient to come in for labs today.

## 2012-08-25 NOTE — Telephone Encounter (Signed)
Spoke with patient about labs results. Per Dr. Olevia Perches, hold Coumadin and recheck CBC on Friday. Patient states Dr. Leanna Battles manages his Coumadin. I will call his office tomorrow.

## 2012-08-26 NOTE — Telephone Encounter (Signed)
DJ called and confirmed receipt of message.

## 2012-08-26 NOTE — Telephone Encounter (Signed)
Called Dr. Shon Baton office and left a message with DJ the triage nurse re: labs from 08/25/12 and Dr. Nichola Sizer recommendations. Requested she call and confirm receipt.

## 2012-08-28 ENCOUNTER — Telehealth: Payer: Self-pay | Admitting: Internal Medicine

## 2012-08-28 ENCOUNTER — Other Ambulatory Visit: Payer: Self-pay | Admitting: *Deleted

## 2012-08-28 ENCOUNTER — Other Ambulatory Visit (INDEPENDENT_AMBULATORY_CARE_PROVIDER_SITE_OTHER): Payer: Medicare Other

## 2012-08-28 DIAGNOSIS — Z9889 Other specified postprocedural states: Secondary | ICD-10-CM | POA: Diagnosis not present

## 2012-08-28 DIAGNOSIS — D649 Anemia, unspecified: Secondary | ICD-10-CM | POA: Diagnosis not present

## 2012-08-28 DIAGNOSIS — Z7901 Long term (current) use of anticoagulants: Secondary | ICD-10-CM | POA: Diagnosis not present

## 2012-08-28 DIAGNOSIS — Z23 Encounter for immunization: Secondary | ICD-10-CM | POA: Diagnosis not present

## 2012-08-28 DIAGNOSIS — I059 Rheumatic mitral valve disease, unspecified: Secondary | ICD-10-CM | POA: Diagnosis not present

## 2012-08-28 LAB — CBC WITH DIFFERENTIAL/PLATELET
Basophils Absolute: 0 10*3/uL (ref 0.0–0.1)
Basophils Relative: 0.2 % (ref 0.0–3.0)
Eosinophils Absolute: 0.1 10*3/uL (ref 0.0–0.7)
Eosinophils Relative: 2 % (ref 0.0–5.0)
HCT: 31.8 % — ABNORMAL LOW (ref 39.0–52.0)
Hemoglobin: 10.6 g/dL — ABNORMAL LOW (ref 13.0–17.0)
Lymphocytes Relative: 18.8 % (ref 12.0–46.0)
Lymphs Abs: 1.1 10*3/uL (ref 0.7–4.0)
MCHC: 33.4 g/dL (ref 30.0–36.0)
MCV: 97.8 fl (ref 78.0–100.0)
Monocytes Absolute: 0.4 10*3/uL (ref 0.1–1.0)
Monocytes Relative: 6.3 % (ref 3.0–12.0)
Neutro Abs: 4.2 10*3/uL (ref 1.4–7.7)
Neutrophils Relative %: 72.7 % (ref 43.0–77.0)
Platelets: 225 10*3/uL (ref 150.0–400.0)
RBC: 3.26 Mil/uL — ABNORMAL LOW (ref 4.22–5.81)
RDW: 15.5 % — ABNORMAL HIGH (ref 11.5–14.6)
WBC: 5.8 10*3/uL (ref 4.5–10.5)

## 2012-08-28 NOTE — Telephone Encounter (Signed)
Spoke with patient and explained that Dr. Shon Baton office will be managing his Coumadin per DJ that I spoke with. He states he called their office and they haven't called him back but he will call again.Called Dr. Shon Baton office and left a message with DJ that patient is calling to find out what to do about his Coumadin. Called patient back and let him know I left a message also for DJ. He states she just called him and is handling the Coumadin.

## 2012-09-03 DIAGNOSIS — I359 Nonrheumatic aortic valve disorder, unspecified: Secondary | ICD-10-CM | POA: Diagnosis not present

## 2012-09-03 DIAGNOSIS — Z7901 Long term (current) use of anticoagulants: Secondary | ICD-10-CM | POA: Diagnosis not present

## 2012-09-03 DIAGNOSIS — I059 Rheumatic mitral valve disease, unspecified: Secondary | ICD-10-CM | POA: Diagnosis not present

## 2012-09-03 DIAGNOSIS — Z954 Presence of other heart-valve replacement: Secondary | ICD-10-CM | POA: Diagnosis not present

## 2012-09-10 ENCOUNTER — Telehealth: Payer: Self-pay | Admitting: *Deleted

## 2012-09-10 DIAGNOSIS — Z954 Presence of other heart-valve replacement: Secondary | ICD-10-CM | POA: Diagnosis not present

## 2012-09-10 DIAGNOSIS — Z7901 Long term (current) use of anticoagulants: Secondary | ICD-10-CM | POA: Diagnosis not present

## 2012-09-10 DIAGNOSIS — I059 Rheumatic mitral valve disease, unspecified: Secondary | ICD-10-CM | POA: Diagnosis not present

## 2012-09-10 DIAGNOSIS — I359 Nonrheumatic aortic valve disorder, unspecified: Secondary | ICD-10-CM | POA: Diagnosis not present

## 2012-09-10 NOTE — Telephone Encounter (Signed)
Spoke with patient and he will get labs next week. 

## 2012-09-10 NOTE — Telephone Encounter (Signed)
Message copied by Hulan Saas on Thu Sep 10, 2012  4:02 PM ------      Message from: Hulan Saas      Created: Fri Aug 28, 2012  2:36 PM       Call and remind patient due for CBC(DB) 9/23

## 2012-09-14 ENCOUNTER — Other Ambulatory Visit (INDEPENDENT_AMBULATORY_CARE_PROVIDER_SITE_OTHER): Payer: Medicare Other

## 2012-09-14 DIAGNOSIS — D649 Anemia, unspecified: Secondary | ICD-10-CM

## 2012-09-14 LAB — CBC WITH DIFFERENTIAL/PLATELET
Basophils Absolute: 0 10*3/uL (ref 0.0–0.1)
Basophils Relative: 0.4 % (ref 0.0–3.0)
Eosinophils Absolute: 0 10*3/uL (ref 0.0–0.7)
Eosinophils Relative: 0.7 % (ref 0.0–5.0)
HCT: 35 % — ABNORMAL LOW (ref 39.0–52.0)
Hemoglobin: 11.6 g/dL — ABNORMAL LOW (ref 13.0–17.0)
Lymphocytes Relative: 13.8 % (ref 12.0–46.0)
Lymphs Abs: 0.7 10*3/uL (ref 0.7–4.0)
MCHC: 33.3 g/dL (ref 30.0–36.0)
MCV: 98.7 fl (ref 78.0–100.0)
Monocytes Absolute: 0.6 10*3/uL (ref 0.1–1.0)
Monocytes Relative: 10.6 % (ref 3.0–12.0)
Neutro Abs: 4 10*3/uL (ref 1.4–7.7)
Neutrophils Relative %: 74.5 % (ref 43.0–77.0)
Platelets: 231 10*3/uL (ref 150.0–400.0)
RBC: 3.54 Mil/uL — ABNORMAL LOW (ref 4.22–5.81)
RDW: 15.2 % — ABNORMAL HIGH (ref 11.5–14.6)
WBC: 5.4 10*3/uL (ref 4.5–10.5)

## 2012-09-16 DIAGNOSIS — I10 Essential (primary) hypertension: Secondary | ICD-10-CM | POA: Diagnosis not present

## 2012-09-16 DIAGNOSIS — K047 Periapical abscess without sinus: Secondary | ICD-10-CM | POA: Diagnosis not present

## 2012-09-16 DIAGNOSIS — Z9889 Other specified postprocedural states: Secondary | ICD-10-CM | POA: Diagnosis not present

## 2012-09-16 DIAGNOSIS — Z7901 Long term (current) use of anticoagulants: Secondary | ICD-10-CM | POA: Diagnosis not present

## 2012-09-17 ENCOUNTER — Telehealth: Payer: Self-pay | Admitting: Internal Medicine

## 2012-09-17 NOTE — Telephone Encounter (Signed)
Spoke with patient and told him to continue the Omeprazole. He will get it refilled.

## 2012-09-24 DIAGNOSIS — Z7901 Long term (current) use of anticoagulants: Secondary | ICD-10-CM | POA: Diagnosis not present

## 2012-09-24 DIAGNOSIS — Z9889 Other specified postprocedural states: Secondary | ICD-10-CM | POA: Diagnosis not present

## 2012-10-19 ENCOUNTER — Other Ambulatory Visit: Payer: Self-pay | Admitting: Internal Medicine

## 2012-10-19 MED ORDER — OMEPRAZOLE 20 MG PO CPDR: 20.0000 mg | DELAYED_RELEASE_CAPSULE | Freq: Every day | ORAL | Status: DC

## 2012-10-19 NOTE — Telephone Encounter (Signed)
Rx sent to patient's pharmacy. Left message on patient's voicemail stating that he should continue omeprazole.

## 2012-10-22 DIAGNOSIS — I359 Nonrheumatic aortic valve disorder, unspecified: Secondary | ICD-10-CM | POA: Diagnosis not present

## 2012-10-22 DIAGNOSIS — I059 Rheumatic mitral valve disease, unspecified: Secondary | ICD-10-CM | POA: Diagnosis not present

## 2012-10-22 DIAGNOSIS — Z954 Presence of other heart-valve replacement: Secondary | ICD-10-CM | POA: Diagnosis not present

## 2012-10-22 DIAGNOSIS — Z7901 Long term (current) use of anticoagulants: Secondary | ICD-10-CM | POA: Diagnosis not present

## 2012-11-25 DIAGNOSIS — Z9889 Other specified postprocedural states: Secondary | ICD-10-CM | POA: Diagnosis not present

## 2012-11-25 DIAGNOSIS — I359 Nonrheumatic aortic valve disorder, unspecified: Secondary | ICD-10-CM | POA: Diagnosis not present

## 2012-11-25 DIAGNOSIS — Z7901 Long term (current) use of anticoagulants: Secondary | ICD-10-CM | POA: Diagnosis not present

## 2012-11-25 DIAGNOSIS — I059 Rheumatic mitral valve disease, unspecified: Secondary | ICD-10-CM | POA: Diagnosis not present

## 2012-11-30 DIAGNOSIS — D649 Anemia, unspecified: Secondary | ICD-10-CM | POA: Diagnosis not present

## 2012-11-30 DIAGNOSIS — I10 Essential (primary) hypertension: Secondary | ICD-10-CM | POA: Diagnosis not present

## 2012-11-30 DIAGNOSIS — E785 Hyperlipidemia, unspecified: Secondary | ICD-10-CM | POA: Diagnosis not present

## 2012-11-30 DIAGNOSIS — M109 Gout, unspecified: Secondary | ICD-10-CM | POA: Diagnosis not present

## 2012-11-30 DIAGNOSIS — N183 Chronic kidney disease, stage 3 unspecified: Secondary | ICD-10-CM | POA: Diagnosis not present

## 2012-11-30 DIAGNOSIS — E213 Hyperparathyroidism, unspecified: Secondary | ICD-10-CM | POA: Diagnosis not present

## 2012-11-30 DIAGNOSIS — N2581 Secondary hyperparathyroidism of renal origin: Secondary | ICD-10-CM | POA: Diagnosis not present

## 2012-11-30 DIAGNOSIS — I129 Hypertensive chronic kidney disease with stage 1 through stage 4 chronic kidney disease, or unspecified chronic kidney disease: Secondary | ICD-10-CM | POA: Diagnosis not present

## 2012-12-09 DIAGNOSIS — I2510 Atherosclerotic heart disease of native coronary artery without angina pectoris: Secondary | ICD-10-CM | POA: Diagnosis not present

## 2012-12-09 DIAGNOSIS — E785 Hyperlipidemia, unspecified: Secondary | ICD-10-CM | POA: Diagnosis not present

## 2012-12-09 DIAGNOSIS — I10 Essential (primary) hypertension: Secondary | ICD-10-CM | POA: Diagnosis not present

## 2012-12-09 DIAGNOSIS — Z8679 Personal history of other diseases of the circulatory system: Secondary | ICD-10-CM | POA: Diagnosis not present

## 2012-12-28 DIAGNOSIS — Z954 Presence of other heart-valve replacement: Secondary | ICD-10-CM | POA: Diagnosis not present

## 2012-12-28 DIAGNOSIS — Z9889 Other specified postprocedural states: Secondary | ICD-10-CM | POA: Diagnosis not present

## 2012-12-28 DIAGNOSIS — Z7901 Long term (current) use of anticoagulants: Secondary | ICD-10-CM | POA: Diagnosis not present

## 2013-01-02 ENCOUNTER — Encounter (HOSPITAL_BASED_OUTPATIENT_CLINIC_OR_DEPARTMENT_OTHER): Payer: Self-pay | Admitting: Emergency Medicine

## 2013-01-02 ENCOUNTER — Emergency Department (HOSPITAL_BASED_OUTPATIENT_CLINIC_OR_DEPARTMENT_OTHER): Payer: No Typology Code available for payment source

## 2013-01-02 ENCOUNTER — Emergency Department (HOSPITAL_BASED_OUTPATIENT_CLINIC_OR_DEPARTMENT_OTHER)
Admission: EM | Admit: 2013-01-02 | Discharge: 2013-01-02 | Disposition: A | Discharge status: Home / Self Care | Payer: No Typology Code available for payment source | Attending: Emergency Medicine | Admitting: Emergency Medicine

## 2013-01-02 DIAGNOSIS — M542 Cervicalgia: Secondary | ICD-10-CM | POA: Diagnosis not present

## 2013-01-02 DIAGNOSIS — S0993XA Unspecified injury of face, initial encounter: Secondary | ICD-10-CM | POA: Diagnosis not present

## 2013-01-02 DIAGNOSIS — M25559 Pain in unspecified hip: Secondary | ICD-10-CM | POA: Diagnosis not present

## 2013-01-02 DIAGNOSIS — Y9389 Activity, other specified: Secondary | ICD-10-CM | POA: Insufficient documentation

## 2013-01-02 DIAGNOSIS — M545 Low back pain, unspecified: Secondary | ICD-10-CM | POA: Diagnosis not present

## 2013-01-02 DIAGNOSIS — IMO0002 Reserved for concepts with insufficient information to code with codable children: Secondary | ICD-10-CM | POA: Insufficient documentation

## 2013-01-02 DIAGNOSIS — Z87891 Personal history of nicotine dependence: Secondary | ICD-10-CM | POA: Insufficient documentation

## 2013-01-02 DIAGNOSIS — R51 Headache: Secondary | ICD-10-CM | POA: Diagnosis not present

## 2013-01-02 DIAGNOSIS — M109 Gout, unspecified: Secondary | ICD-10-CM | POA: Insufficient documentation

## 2013-01-02 DIAGNOSIS — Z79899 Other long term (current) drug therapy: Secondary | ICD-10-CM | POA: Insufficient documentation

## 2013-01-02 DIAGNOSIS — I10 Essential (primary) hypertension: Secondary | ICD-10-CM | POA: Insufficient documentation

## 2013-01-02 DIAGNOSIS — T148XXA Other injury of unspecified body region, initial encounter: Secondary | ICD-10-CM | POA: Diagnosis not present

## 2013-01-02 DIAGNOSIS — S199XXA Unspecified injury of neck, initial encounter: Secondary | ICD-10-CM | POA: Diagnosis not present

## 2013-01-02 DIAGNOSIS — S79919A Unspecified injury of unspecified hip, initial encounter: Secondary | ICD-10-CM | POA: Insufficient documentation

## 2013-01-02 DIAGNOSIS — Z8679 Personal history of other diseases of the circulatory system: Secondary | ICD-10-CM | POA: Insufficient documentation

## 2013-01-02 DIAGNOSIS — Z7901 Long term (current) use of anticoagulants: Secondary | ICD-10-CM | POA: Insufficient documentation

## 2013-01-02 DIAGNOSIS — Z8739 Personal history of other diseases of the musculoskeletal system and connective tissue: Secondary | ICD-10-CM | POA: Insufficient documentation

## 2013-01-02 DIAGNOSIS — R011 Cardiac murmur, unspecified: Secondary | ICD-10-CM | POA: Insufficient documentation

## 2013-01-02 DIAGNOSIS — S0990XA Unspecified injury of head, initial encounter: Secondary | ICD-10-CM | POA: Diagnosis not present

## 2013-01-02 DIAGNOSIS — E785 Hyperlipidemia, unspecified: Secondary | ICD-10-CM | POA: Insufficient documentation

## 2013-01-02 DIAGNOSIS — Y92410 Unspecified street and highway as the place of occurrence of the external cause: Secondary | ICD-10-CM | POA: Insufficient documentation

## 2013-01-02 MED ORDER — CYCLOBENZAPRINE HCL 10 MG PO TABS: 10.0000 mg | ORAL_TABLET | Freq: Three times a day (TID) | ORAL | Status: DC | PRN
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
## 2013-01-02 NOTE — ED Notes (Signed)
MD at bedside. Warm blankets given. The bed rails are up and the bed is locked and in the lowest position. The call light is within reach, and the family is at the bedside also.

## 2013-01-02 NOTE — ED Notes (Signed)
Patient transported to CT 
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
## 2013-02-08 DIAGNOSIS — Z125 Encounter for screening for malignant neoplasm of prostate: Secondary | ICD-10-CM | POA: Diagnosis not present

## 2013-02-08 DIAGNOSIS — R7309 Other abnormal glucose: Secondary | ICD-10-CM | POA: Diagnosis not present

## 2013-02-08 DIAGNOSIS — Z7901 Long term (current) use of anticoagulants: Secondary | ICD-10-CM | POA: Diagnosis not present

## 2013-02-08 DIAGNOSIS — M109 Gout, unspecified: Secondary | ICD-10-CM | POA: Diagnosis not present

## 2013-02-08 DIAGNOSIS — E785 Hyperlipidemia, unspecified: Secondary | ICD-10-CM | POA: Diagnosis not present

## 2013-02-08 DIAGNOSIS — I10 Essential (primary) hypertension: Secondary | ICD-10-CM | POA: Diagnosis not present

## 2013-02-08 DIAGNOSIS — Z79899 Other long term (current) drug therapy: Secondary | ICD-10-CM | POA: Diagnosis not present

## 2013-02-12 DIAGNOSIS — E785 Hyperlipidemia, unspecified: Secondary | ICD-10-CM | POA: Diagnosis not present

## 2013-02-12 DIAGNOSIS — Z7901 Long term (current) use of anticoagulants: Secondary | ICD-10-CM | POA: Diagnosis not present

## 2013-02-12 DIAGNOSIS — N529 Male erectile dysfunction, unspecified: Secondary | ICD-10-CM | POA: Diagnosis not present

## 2013-02-12 DIAGNOSIS — Z0000 Encounter for general adult medical examination without abnormal findings: Secondary | ICD-10-CM | POA: Diagnosis not present

## 2013-02-12 DIAGNOSIS — R21 Rash and other nonspecific skin eruption: Secondary | ICD-10-CM | POA: Diagnosis not present

## 2013-02-15 DIAGNOSIS — Z1212 Encounter for screening for malignant neoplasm of rectum: Secondary | ICD-10-CM | POA: Diagnosis not present

## 2013-03-09 ENCOUNTER — Encounter: Payer: Self-pay | Admitting: *Deleted

## 2013-03-17 DIAGNOSIS — Z7901 Long term (current) use of anticoagulants: Secondary | ICD-10-CM | POA: Diagnosis not present

## 2013-03-17 DIAGNOSIS — Z9889 Other specified postprocedural states: Secondary | ICD-10-CM | POA: Diagnosis not present

## 2013-03-17 DIAGNOSIS — I059 Rheumatic mitral valve disease, unspecified: Secondary | ICD-10-CM | POA: Diagnosis not present

## 2013-03-17 DIAGNOSIS — I359 Nonrheumatic aortic valve disorder, unspecified: Secondary | ICD-10-CM | POA: Diagnosis not present

## 2013-03-31 DIAGNOSIS — E213 Hyperparathyroidism, unspecified: Secondary | ICD-10-CM | POA: Diagnosis not present

## 2013-03-31 DIAGNOSIS — I129 Hypertensive chronic kidney disease with stage 1 through stage 4 chronic kidney disease, or unspecified chronic kidney disease: Secondary | ICD-10-CM | POA: Diagnosis not present

## 2013-03-31 DIAGNOSIS — N183 Chronic kidney disease, stage 3 unspecified: Secondary | ICD-10-CM | POA: Diagnosis not present

## 2013-03-31 DIAGNOSIS — M109 Gout, unspecified: Secondary | ICD-10-CM | POA: Diagnosis not present

## 2013-04-08 ENCOUNTER — Encounter: Payer: Self-pay | Admitting: Internal Medicine

## 2013-04-08 ENCOUNTER — Ambulatory Visit (INDEPENDENT_AMBULATORY_CARE_PROVIDER_SITE_OTHER): Payer: Medicare Other | Admitting: Internal Medicine

## 2013-04-08 VITALS — BP 110/60 | HR 64 | Ht 65.25 in | Wt 195.4 lb

## 2013-04-08 DIAGNOSIS — R195 Other fecal abnormalities: Secondary | ICD-10-CM

## 2013-04-08 DIAGNOSIS — R160 Hepatomegaly, not elsewhere classified: Secondary | ICD-10-CM

## 2013-04-08 NOTE — Patient Instructions (Addendum)
You have been scheduled for an abdominal ultrasound at [facility] (1st floor of hospital) on Tuesday 04/13/13 at 9:00 am. Please arrive 15 minutes prior to your appointment for registration. Make certain not to have anything to eat or drink 6 hours prior to your appointment. Should you need to reschedule your appointment, please contact radiology at [phone]. This test typically takes about 30 minutes to perform.  CC: Dr Leanna Battles

## 2013-04-08 NOTE — Progress Notes (Signed)
Wadell Kromer 09-03-1929 MRN [id number]  History of Present Illness:  This is an 79 year old white male who tested heme-positive on a home test.given by Dr Philip Aspen. He denies visible blood per rectum. I have known Mr. Lazo since 1987 when he started having screening colonoscopies for colon polyps. He had a tubular adenoma in 1987, 2002, and 2004. His last colonoscopy was at [facility] in 2007 for GI bleeding. He at that time also had an upper endoscopy and small bowel capsule endoscopy but no definite diagnosis was made as far as the origin of his blood loss.. He has been on lifo long Coumadin for 9 years after placement of two mechanical valves. His INR has been therapeutic. He has chronic renal insufficiency with a creatinine of 1.7 but has had a normal hemoglobin of 13.7 and hematocrit of 40 with MCV of 100. He has regular bowel habits. He denies dysphagia, odynophagia or abdominal pain.   Past Medical History  Diagnosis Date  . Endocarditis   . Gout   . Hyperlipemia   . Hypertension   . Arthritis   . Heart murmur   . Hx of adenomatous colonic polyps   . Diverticulosis   . Hyperparathyroidism   . Nephrosclerosis    Past Surgical History  Procedure Laterality Date  . Coronary artery bypass graft    . Mitral valve replacement    . Aortic and mitral valve replacement    . Cataract extraction Left     with lens implant  . Orif arm      does not have a smoking history on file. He has never used smokeless tobacco. He reports that he does not drink alcohol or use illicit drugs. family history includes Breast cancer in his daughter; Hypertension in his father and mother; Liver cancer (age of onset: 11) in his brother; Stroke in his mother; and Uterine cancer in his daughter. Allergies  Allergen Reactions  . Bee Venom   . Ace Inhibitors Cough        Review of Systems: Denies dysphagia, odynophagia, abdominal pain  The remainder of the 10 point ROS is negative except as outlined in  H&P   Physical Exam: General appearance  Well developed, in no distress. Eyes- non icteric. HEENT nontraumatic, normocephalic. Mouth no lesions, tongue papillated, no cheilosis. Neck supple without adenopathy, thyroid not enlarged, no carotid bruits, no JVD. Lungs Clear to auscultation bilaterally. Cor normal S1, normal S2, regular rhythm, 2 mechanical valve sounds  quiet precordium .: A scar. Abdomen: Soft nontender with normal active bowel sounds. Liver edge is sharp, firm and nontender around 2 centimeters below right costal margin. Rectal: brown Hemoccult negative stool. Extremities no pedal edema. Skin no lesions. Neurological alert and oriented x 3. Psychological normal mood and affect.  Assessment and Plan:   Problem #48 79 year old white male with heme positive stool on a home test who is otherwise asymptomatic. He has a normal hemoglobin of 13.7 and on my exam today, he is Hemoccult negative. He is on chronic lifelong anticoagulation with Coumadin which would have to be discontinued and bridged with either with heparin or Lovenox if we were to repeat some of his endoscopic procedures. He has a history of adenomatous polyps of the colon with the last exam being in 2008. We are not sure if he had polyps then. If he has intermittent blood loss, it may not be significant since his blood count is normal. He is not taking any iron supplements. I think doing a colonoscopy would  present more risks than benefits. He had an upper endoscopy in August 2013 and it did not show any bleeding lesion. I may consider  repeating a small bowel capsule endoscopy if he develops persistently heme positive stools since the yield would be high in the presence of active bleeding. He and his wife agreed that observation is appropriate at this point. I recommend we monitor his blood count on a regular basis.  Problem #2 Signs of hepatomegaly on my exam today. We will obtain an upper abdominal  ultrasound.   04/08/2013 Delfin Edis

## 2013-04-13 ENCOUNTER — Ambulatory Visit (HOSPITAL_COMMUNITY)
Admission: RE | Admit: 2013-04-13 | Discharge: 2013-04-13 | Disposition: A | Discharge status: Home / Self Care | Payer: Medicare Other | Source: Ambulatory Visit | Attending: Internal Medicine | Admitting: Internal Medicine

## 2013-04-13 DIAGNOSIS — E785 Hyperlipidemia, unspecified: Secondary | ICD-10-CM | POA: Diagnosis not present

## 2013-04-13 DIAGNOSIS — I10 Essential (primary) hypertension: Secondary | ICD-10-CM | POA: Insufficient documentation

## 2013-04-13 DIAGNOSIS — R160 Hepatomegaly, not elsewhere classified: Secondary | ICD-10-CM | POA: Diagnosis not present

## 2013-04-13 DIAGNOSIS — K8020 Calculus of gallbladder without cholecystitis without obstruction: Secondary | ICD-10-CM | POA: Insufficient documentation

## 2013-04-13 DIAGNOSIS — Z0000 Encounter for general adult medical examination without abnormal findings: Secondary | ICD-10-CM | POA: Diagnosis not present

## 2013-04-16 ENCOUNTER — Other Ambulatory Visit: Payer: Self-pay | Admitting: Internal Medicine

## 2013-04-20 DIAGNOSIS — I059 Rheumatic mitral valve disease, unspecified: Secondary | ICD-10-CM | POA: Diagnosis not present

## 2013-04-20 DIAGNOSIS — I359 Nonrheumatic aortic valve disorder, unspecified: Secondary | ICD-10-CM | POA: Diagnosis not present

## 2013-04-20 DIAGNOSIS — Z7901 Long term (current) use of anticoagulants: Secondary | ICD-10-CM | POA: Diagnosis not present

## 2013-04-20 DIAGNOSIS — Z9889 Other specified postprocedural states: Secondary | ICD-10-CM | POA: Diagnosis not present

## 2013-05-25 DIAGNOSIS — Z9889 Other specified postprocedural states: Secondary | ICD-10-CM | POA: Diagnosis not present

## 2013-05-25 DIAGNOSIS — Z7901 Long term (current) use of anticoagulants: Secondary | ICD-10-CM | POA: Diagnosis not present

## 2013-05-25 DIAGNOSIS — I359 Nonrheumatic aortic valve disorder, unspecified: Secondary | ICD-10-CM | POA: Diagnosis not present

## 2013-05-25 DIAGNOSIS — I059 Rheumatic mitral valve disease, unspecified: Secondary | ICD-10-CM | POA: Diagnosis not present

## 2013-06-09 DIAGNOSIS — I4891 Unspecified atrial fibrillation: Secondary | ICD-10-CM | POA: Diagnosis not present

## 2013-06-09 DIAGNOSIS — E785 Hyperlipidemia, unspecified: Secondary | ICD-10-CM | POA: Diagnosis not present

## 2013-06-09 DIAGNOSIS — I10 Essential (primary) hypertension: Secondary | ICD-10-CM | POA: Diagnosis not present

## 2013-06-09 DIAGNOSIS — Z8679 Personal history of other diseases of the circulatory system: Secondary | ICD-10-CM | POA: Diagnosis not present

## 2013-06-09 DIAGNOSIS — I2510 Atherosclerotic heart disease of native coronary artery without angina pectoris: Secondary | ICD-10-CM | POA: Diagnosis not present

## 2013-06-23 DIAGNOSIS — Z954 Presence of other heart-valve replacement: Secondary | ICD-10-CM | POA: Diagnosis not present

## 2013-06-23 DIAGNOSIS — Z7901 Long term (current) use of anticoagulants: Secondary | ICD-10-CM | POA: Diagnosis not present

## 2013-06-23 DIAGNOSIS — Z9889 Other specified postprocedural states: Secondary | ICD-10-CM | POA: Diagnosis not present

## 2013-07-08 DIAGNOSIS — E119 Type 2 diabetes mellitus without complications: Secondary | ICD-10-CM | POA: Diagnosis not present

## 2013-07-08 DIAGNOSIS — H2510 Age-related nuclear cataract, unspecified eye: Secondary | ICD-10-CM | POA: Diagnosis not present

## 2013-07-08 DIAGNOSIS — H35039 Hypertensive retinopathy, unspecified eye: Secondary | ICD-10-CM | POA: Diagnosis not present

## 2013-07-08 DIAGNOSIS — Z961 Presence of intraocular lens: Secondary | ICD-10-CM | POA: Diagnosis not present

## 2013-07-09 ENCOUNTER — Emergency Department (HOSPITAL_BASED_OUTPATIENT_CLINIC_OR_DEPARTMENT_OTHER): Payer: Medicare Other

## 2013-07-09 ENCOUNTER — Emergency Department (HOSPITAL_BASED_OUTPATIENT_CLINIC_OR_DEPARTMENT_OTHER)
Admission: EM | Admit: 2013-07-09 | Discharge: 2013-07-09 | Disposition: A | Discharge status: Home / Self Care | Payer: Medicare Other | Attending: Emergency Medicine | Admitting: Emergency Medicine

## 2013-07-09 ENCOUNTER — Encounter (HOSPITAL_BASED_OUTPATIENT_CLINIC_OR_DEPARTMENT_OTHER): Payer: Self-pay | Admitting: *Deleted

## 2013-07-09 DIAGNOSIS — S0990XA Unspecified injury of head, initial encounter: Secondary | ICD-10-CM | POA: Diagnosis not present

## 2013-07-09 DIAGNOSIS — Z862 Personal history of diseases of the blood and blood-forming organs and certain disorders involving the immune mechanism: Secondary | ICD-10-CM | POA: Insufficient documentation

## 2013-07-09 DIAGNOSIS — S1093XA Contusion of unspecified part of neck, initial encounter: Secondary | ICD-10-CM | POA: Insufficient documentation

## 2013-07-09 DIAGNOSIS — Z7901 Long term (current) use of anticoagulants: Secondary | ICD-10-CM | POA: Diagnosis not present

## 2013-07-09 DIAGNOSIS — M129 Arthropathy, unspecified: Secondary | ICD-10-CM | POA: Diagnosis not present

## 2013-07-09 DIAGNOSIS — Y9389 Activity, other specified: Secondary | ICD-10-CM | POA: Insufficient documentation

## 2013-07-09 DIAGNOSIS — Z860100 Personal history of colon polyps, unspecified: Secondary | ICD-10-CM | POA: Insufficient documentation

## 2013-07-09 DIAGNOSIS — Z87448 Personal history of other diseases of urinary system: Secondary | ICD-10-CM | POA: Insufficient documentation

## 2013-07-09 DIAGNOSIS — E785 Hyperlipidemia, unspecified: Secondary | ICD-10-CM | POA: Diagnosis not present

## 2013-07-09 DIAGNOSIS — Z8679 Personal history of other diseases of the circulatory system: Secondary | ICD-10-CM | POA: Diagnosis not present

## 2013-07-09 DIAGNOSIS — Z954 Presence of other heart-valve replacement: Secondary | ICD-10-CM | POA: Insufficient documentation

## 2013-07-09 DIAGNOSIS — Z8719 Personal history of other diseases of the digestive system: Secondary | ICD-10-CM | POA: Diagnosis not present

## 2013-07-09 DIAGNOSIS — W19XXXA Unspecified fall, initial encounter: Secondary | ICD-10-CM

## 2013-07-09 DIAGNOSIS — S0101XA Laceration without foreign body of scalp, initial encounter: Secondary | ICD-10-CM

## 2013-07-09 DIAGNOSIS — Z8639 Personal history of other endocrine, nutritional and metabolic disease: Secondary | ICD-10-CM | POA: Insufficient documentation

## 2013-07-09 DIAGNOSIS — Z8739 Personal history of other diseases of the musculoskeletal system and connective tissue: Secondary | ICD-10-CM | POA: Insufficient documentation

## 2013-07-09 DIAGNOSIS — W1809XA Striking against other object with subsequent fall, initial encounter: Secondary | ICD-10-CM | POA: Insufficient documentation

## 2013-07-09 DIAGNOSIS — Y92009 Unspecified place in unspecified non-institutional (private) residence as the place of occurrence of the external cause: Secondary | ICD-10-CM | POA: Insufficient documentation

## 2013-07-09 DIAGNOSIS — Z79899 Other long term (current) drug therapy: Secondary | ICD-10-CM | POA: Insufficient documentation

## 2013-07-09 DIAGNOSIS — R011 Cardiac murmur, unspecified: Secondary | ICD-10-CM | POA: Diagnosis not present

## 2013-07-09 DIAGNOSIS — S0100XA Unspecified open wound of scalp, initial encounter: Secondary | ICD-10-CM | POA: Diagnosis not present

## 2013-07-09 DIAGNOSIS — I10 Essential (primary) hypertension: Secondary | ICD-10-CM | POA: Diagnosis not present

## 2013-07-09 DIAGNOSIS — S0003XA Contusion of scalp, initial encounter: Secondary | ICD-10-CM | POA: Diagnosis not present

## 2013-07-09 DIAGNOSIS — Z23 Encounter for immunization: Secondary | ICD-10-CM | POA: Insufficient documentation

## 2013-07-09 DIAGNOSIS — Z951 Presence of aortocoronary bypass graft: Secondary | ICD-10-CM | POA: Insufficient documentation

## 2013-07-09 LAB — PROTIME-INR
INR: 2.71 — ABNORMAL HIGH (ref 0.00–1.49)
Prothrombin Time: 27.8 seconds — ABNORMAL HIGH (ref 11.6–15.2)

## 2013-07-09 MED ORDER — TETANUS-DIPHTH-ACELL PERTUSSIS 5-2.5-18.5 LF-MCG/0.5 IM SUSP
0.5000 mL | Freq: Once | INTRAMUSCULAR | Status: AC
  Administered 2013-07-09: 0.5 mL via INTRAMUSCULAR
  Filled 2013-07-09: qty 0.5

## 2013-07-09 NOTE — ED Notes (Signed)
Fell backward while walking the dog and hit the back of his head on the gravel driveway. No loc. He is taking Coumadin.

## 2013-07-09 NOTE — ED Notes (Signed)
Suture cart is at the bedside set up and ready for the doctor to use. 

## 2013-07-09 NOTE — ED Provider Notes (Addendum)
History    CSN: [id number] Arrival date & time 07/09/13  1716  First MD Initiated Contact with Patient 07/09/13 1726     Chief Complaint  Patient presents with  . Fall   (Consider location/radiation/quality/duration/timing/severity/associated sxs/prior Treatment) Patient is a 79 y.o. male presenting with fall. The history is provided by the patient.  Fall This is a new problem. The current episode started less than 1 hour ago. The problem occurs constantly. The problem has not changed since onset.Pertinent negatives include no headaches. Associated symptoms comments: [place] when his dog pulled him backwards and hit his head in some gravel.  No LOC and o/w feeling fine but he is on coumadin.  Also cut his head on the rocks but not bleeding at this time.. Nothing aggravates the symptoms. Nothing relieves the symptoms. He has tried a cold compress for the symptoms. The treatment provided moderate relief.   Past Medical History  Diagnosis Date  . Endocarditis   . Gout   . Hyperlipemia   . Hypertension   . Arthritis   . Heart murmur   . Hx of adenomatous colonic polyps   . Diverticulosis   . Hyperparathyroidism   . Nephrosclerosis    Past Surgical History  Procedure Laterality Date  . Coronary artery bypass graft    . Mitral valve replacement    . Aortic and mitral valve replacement    . Cataract extraction Left     with lens implant  . Orif arm     Family History  Problem Relation Age of Onset  . Hypertension Mother   . Stroke Mother   . Hypertension Father   . Breast cancer Daughter   . Uterine cancer Daughter   . Liver cancer Brother 48   History  Substance Use Topics  . Smoking status: Not on file  . Smokeless tobacco: Never Used  . Alcohol Use: No    Review of Systems  Neurological: Negative for syncope, speech difficulty, light-headedness, numbness and headaches.  All other systems reviewed and are negative.    Allergies  Bee venom and Ace  inhibitors  Home Medications   Current Outpatient Rx  Name  Route  Sig  Dispense  Refill  . glucosamine-chondroitin 500-400 MG tablet   Oral   Take 1 tablet by mouth 3 (three) times daily.         Marland Kitchen acetaminophen (TYLENOL) 650 MG CR tablet   Oral   Take 650 mg by mouth every 8 (eight) hours as needed.         Marland Kitchen allopurinol (ZYLOPRIM) 100 MG tablet   Oral   Take 400 mg by mouth daily.         . bumetanide (BUMEX) 2 MG tablet      Take 2 tablets by mouth in the morning and 1 tablet in the evening         . calcitRIOL (ROCALTROL) 0.5 MCG capsule      Take .5 tablet by mouth every other day alternating with 1 tablet every other day         . cetirizine (ZYRTEC) 10 MG tablet   Oral   Take 10 mg by mouth daily.         . diphenhydrAMINE (BENADRYL) 25 MG tablet   Oral   Take 25 mg by mouth at bedtime as needed.         Marland Kitchen EPINEPHrine (EPIPEN JR) 0.15 MG/0.3ML injection   Intramuscular   Inject 0.15 mg into  the muscle as needed.         . fish oil-omega-3 fatty acids 1000 MG capsule   Oral   Take 1 g by mouth daily.         Marland Kitchen gemfibrozil (LOPID) 600 MG tablet   Oral   Take 600 mg by mouth 2 (two) times daily.         Marland Kitchen losartan (COZAAR) 100 MG tablet   Oral   Take 75 mg by mouth daily.          . metoprolol tartrate (LOPRESSOR) 25 MG tablet   Oral   Take 12.5 mg by mouth 2 (two) times daily.          . Multiple Vitamins-Minerals (MULTIVITAMIN WITH MINERALS) tablet   Oral   Take 1 tablet by mouth daily.         Marland Kitchen omeprazole (PRILOSEC) 20 MG capsule      TAKE ONE CAPSULE BY MOUTH EVERY DAY   30 capsule   5   . potassium chloride SA (K-DUR,KLOR-CON) 20 MEQ tablet   Oral   Take 20 mEq by mouth 2 (two) times daily.         . pravastatin (PRAVACHOL) 40 MG tablet   Oral   Take 40 mg by mouth daily.         Marland Kitchen warfarin (COUMADIN) 5 MG tablet      Take 7.5 mg by mouth Tues and Fri and 5 mg all other days          BP 121/63   Pulse 63  Temp(Src) 98.7 F (37.1 C) (Oral)  Resp 20  Wt 183 lb (83.008 kg)  BMI 30.23 kg/m2  SpO2 97% Physical Exam  Nursing note and vitals reviewed. Constitutional: He is oriented to person, place, and time. He appears well-developed and well-nourished. No distress.  HENT:  Head: Normocephalic. Head is with abrasion, with contusion and with laceration.    Mouth/Throat: Oropharynx is clear and moist.  Eyes: Conjunctivae and EOM are normal. Pupils are equal, round, and reactive to light.  Neck: Normal range of motion. Neck supple. No spinous process tenderness and no muscular tenderness present.  Cardiovascular: Normal rate, regular rhythm and intact distal pulses.   Murmur heard. Pulmonary/Chest: Effort normal and breath sounds normal. No respiratory distress. He has no wheezes. He has no rales.  Abdominal: Soft. He exhibits no distension. There is no tenderness. There is no rebound and no guarding.  Musculoskeletal: Normal range of motion. He exhibits no edema and no tenderness.       Cervical back: Normal.       Thoracic back: Normal.       Lumbar back: Normal.  Abrasion to the left elbow  Neurological: He is alert and oriented to person, place, and time.  Skin: Skin is warm and dry. No rash noted. No erythema.  Psychiatric: He has a normal mood and affect. His behavior is normal.    ED Course  Procedures (including critical care time) Labs Reviewed  PROTIME-INR - Abnormal; Notable for the following:    Prothrombin Time 27.8 (*)    INR 2.71 (*)    All other components within normal limits   LACERATION REPAIR Performed by: Blanchie Dessert Authorized byBlanchie Dessert Consent: Verbal consent obtained. Risks and benefits: risks, benefits and alternatives were discussed Consent given by: patient Patient identity confirmed: provided demographic data Prepped and Draped in normal sterile fashion Wound explored  Laceration Location: Scalp  Laceration Length: 3  cm  No Foreign Bodies seen or palpated  Anesthesia: local infiltration  Local anesthetic: lidocaine 2 % with epinephrine  Anesthetic total: 4 ml  Irrigation method: syringe Amount of cleaning: standard  Skin closure: 4.0 Vicryl rapide  Number of sutures: 5   Technique: Simple interrupted   Patient tolerance: Patient tolerated the procedure well with no immediate complications.  Ct Head Wo Contrast  07/09/2013   *RADIOLOGY REPORT*  Clinical Data: Fall, the patient is on Coumadin.  Head hit pavement.  No loss of consciousness.  CT HEAD WITHOUT CONTRAST  Technique:  Contiguous axial images were obtained from the base of the skull through the vertex without contrast.  Comparison: 01/02/2013  Findings: The ventricles are normal in size, for this patient's age, and configuration.  No parenchymal masses or mass effect.  There is no evidence of a recent transcortical infarct.  There are patchy areas of hypoattenuation in the white matter most consistent with chronic microvascular ischemic change.  This is stable.  No extra-axial masses or abnormal fluid collections.  There is no intracranial hemorrhage.  Occipital bone lucencies reflecting large arachnoid granulations are stable.  No skull fracture.  The visualized sinuses and mastoid air cells are clear.  IMPRESSION: No acute intracranial abnormalities.  Specifically, no intracranial hemorrhage.   Original Report Authenticated By: Lajean Manes, M.D.   1. Fall at home, initial encounter   2. Scalp laceration, initial encounter     MDM   Patient with a mechanical fall and injury to his head. Patient is on Coumadin but denies LOC. He is well appearing and neurovascularly intact at this time. Injury occurred approximately 30 minutes prior to arrival. INR and head CT pending. Wound repaired as above and tetanus updated. Patient is able to ablate without difficulty and no other signs of underlying injury.  CT negative for acute injury and INR  2.7  Blanchie Dessert, MD 07/09/13 [id number]  Blanchie Dessert, MD 07/09/13 [id number]

## 2013-07-22 DIAGNOSIS — I059 Rheumatic mitral valve disease, unspecified: Secondary | ICD-10-CM | POA: Diagnosis not present

## 2013-07-22 DIAGNOSIS — I359 Nonrheumatic aortic valve disorder, unspecified: Secondary | ICD-10-CM | POA: Diagnosis not present

## 2013-07-22 DIAGNOSIS — Z7901 Long term (current) use of anticoagulants: Secondary | ICD-10-CM | POA: Diagnosis not present

## 2013-07-22 DIAGNOSIS — Z9889 Other specified postprocedural states: Secondary | ICD-10-CM | POA: Diagnosis not present

## 2013-08-24 DIAGNOSIS — I359 Nonrheumatic aortic valve disorder, unspecified: Secondary | ICD-10-CM | POA: Diagnosis not present

## 2013-08-24 DIAGNOSIS — I059 Rheumatic mitral valve disease, unspecified: Secondary | ICD-10-CM | POA: Diagnosis not present

## 2013-08-24 DIAGNOSIS — Z7901 Long term (current) use of anticoagulants: Secondary | ICD-10-CM | POA: Diagnosis not present

## 2013-08-24 DIAGNOSIS — Z9889 Other specified postprocedural states: Secondary | ICD-10-CM | POA: Diagnosis not present

## 2013-08-30 DIAGNOSIS — I129 Hypertensive chronic kidney disease with stage 1 through stage 4 chronic kidney disease, or unspecified chronic kidney disease: Secondary | ICD-10-CM | POA: Diagnosis not present

## 2013-08-30 DIAGNOSIS — N183 Chronic kidney disease, stage 3 unspecified: Secondary | ICD-10-CM | POA: Diagnosis not present

## 2013-08-30 DIAGNOSIS — I10 Essential (primary) hypertension: Secondary | ICD-10-CM | POA: Diagnosis not present

## 2013-08-30 DIAGNOSIS — N2581 Secondary hyperparathyroidism of renal origin: Secondary | ICD-10-CM | POA: Diagnosis not present

## 2013-08-30 DIAGNOSIS — D649 Anemia, unspecified: Secondary | ICD-10-CM | POA: Diagnosis not present

## 2013-08-30 DIAGNOSIS — E213 Hyperparathyroidism, unspecified: Secondary | ICD-10-CM | POA: Diagnosis not present

## 2013-09-29 DIAGNOSIS — Z9889 Other specified postprocedural states: Secondary | ICD-10-CM | POA: Diagnosis not present

## 2013-09-29 DIAGNOSIS — Z7901 Long term (current) use of anticoagulants: Secondary | ICD-10-CM | POA: Diagnosis not present

## 2013-09-29 DIAGNOSIS — I059 Rheumatic mitral valve disease, unspecified: Secondary | ICD-10-CM | POA: Diagnosis not present

## 2013-09-29 DIAGNOSIS — Z23 Encounter for immunization: Secondary | ICD-10-CM | POA: Diagnosis not present

## 2013-09-29 DIAGNOSIS — I359 Nonrheumatic aortic valve disorder, unspecified: Secondary | ICD-10-CM | POA: Diagnosis not present

## 2013-10-08 ENCOUNTER — Other Ambulatory Visit: Payer: Self-pay | Admitting: Internal Medicine

## 2013-11-03 DIAGNOSIS — Z7901 Long term (current) use of anticoagulants: Secondary | ICD-10-CM | POA: Diagnosis not present

## 2013-11-03 DIAGNOSIS — Z9889 Other specified postprocedural states: Secondary | ICD-10-CM | POA: Diagnosis not present

## 2013-11-03 DIAGNOSIS — I059 Rheumatic mitral valve disease, unspecified: Secondary | ICD-10-CM | POA: Diagnosis not present

## 2013-11-03 DIAGNOSIS — I359 Nonrheumatic aortic valve disorder, unspecified: Secondary | ICD-10-CM | POA: Diagnosis not present

## 2013-12-07 DIAGNOSIS — Z7901 Long term (current) use of anticoagulants: Secondary | ICD-10-CM | POA: Diagnosis not present

## 2013-12-07 DIAGNOSIS — I359 Nonrheumatic aortic valve disorder, unspecified: Secondary | ICD-10-CM | POA: Diagnosis not present

## 2013-12-07 DIAGNOSIS — Z954 Presence of other heart-valve replacement: Secondary | ICD-10-CM | POA: Diagnosis not present

## 2013-12-07 DIAGNOSIS — I059 Rheumatic mitral valve disease, unspecified: Secondary | ICD-10-CM | POA: Diagnosis not present

## 2013-12-07 DIAGNOSIS — Z9889 Other specified postprocedural states: Secondary | ICD-10-CM | POA: Diagnosis not present

## 2014-01-05 DIAGNOSIS — Z8679 Personal history of other diseases of the circulatory system: Secondary | ICD-10-CM | POA: Diagnosis not present

## 2014-01-05 DIAGNOSIS — E785 Hyperlipidemia, unspecified: Secondary | ICD-10-CM | POA: Diagnosis not present

## 2014-01-05 DIAGNOSIS — I2510 Atherosclerotic heart disease of native coronary artery without angina pectoris: Secondary | ICD-10-CM | POA: Diagnosis not present

## 2014-01-05 DIAGNOSIS — I10 Essential (primary) hypertension: Secondary | ICD-10-CM | POA: Diagnosis not present

## 2014-01-06 DIAGNOSIS — I359 Nonrheumatic aortic valve disorder, unspecified: Secondary | ICD-10-CM | POA: Diagnosis not present

## 2014-01-06 DIAGNOSIS — Z954 Presence of other heart-valve replacement: Secondary | ICD-10-CM | POA: Diagnosis not present

## 2014-01-06 DIAGNOSIS — I059 Rheumatic mitral valve disease, unspecified: Secondary | ICD-10-CM | POA: Diagnosis not present

## 2014-01-06 DIAGNOSIS — Z7901 Long term (current) use of anticoagulants: Secondary | ICD-10-CM | POA: Diagnosis not present

## 2014-01-07 ENCOUNTER — Other Ambulatory Visit: Payer: Self-pay | Admitting: Internal Medicine

## 2014-01-31 DIAGNOSIS — D631 Anemia in chronic kidney disease: Secondary | ICD-10-CM | POA: Diagnosis not present

## 2014-01-31 DIAGNOSIS — M109 Gout, unspecified: Secondary | ICD-10-CM | POA: Diagnosis not present

## 2014-01-31 DIAGNOSIS — I129 Hypertensive chronic kidney disease with stage 1 through stage 4 chronic kidney disease, or unspecified chronic kidney disease: Secondary | ICD-10-CM | POA: Diagnosis not present

## 2014-01-31 DIAGNOSIS — N183 Chronic kidney disease, stage 3 unspecified: Secondary | ICD-10-CM | POA: Diagnosis not present

## 2014-01-31 DIAGNOSIS — N2581 Secondary hyperparathyroidism of renal origin: Secondary | ICD-10-CM | POA: Diagnosis not present

## 2014-02-10 DIAGNOSIS — I10 Essential (primary) hypertension: Secondary | ICD-10-CM | POA: Diagnosis not present

## 2014-02-10 DIAGNOSIS — M109 Gout, unspecified: Secondary | ICD-10-CM | POA: Diagnosis not present

## 2014-02-10 DIAGNOSIS — I359 Nonrheumatic aortic valve disorder, unspecified: Secondary | ICD-10-CM | POA: Diagnosis not present

## 2014-02-10 DIAGNOSIS — Z125 Encounter for screening for malignant neoplasm of prostate: Secondary | ICD-10-CM | POA: Diagnosis not present

## 2014-02-10 DIAGNOSIS — E785 Hyperlipidemia, unspecified: Secondary | ICD-10-CM | POA: Diagnosis not present

## 2014-02-10 DIAGNOSIS — R7309 Other abnormal glucose: Secondary | ICD-10-CM | POA: Diagnosis not present

## 2014-02-10 DIAGNOSIS — Z7901 Long term (current) use of anticoagulants: Secondary | ICD-10-CM | POA: Diagnosis not present

## 2014-02-16 DIAGNOSIS — Z0000 Encounter for general adult medical examination without abnormal findings: Secondary | ICD-10-CM | POA: Diagnosis not present

## 2014-02-16 DIAGNOSIS — M109 Gout, unspecified: Secondary | ICD-10-CM | POA: Diagnosis not present

## 2014-02-16 DIAGNOSIS — Z1212 Encounter for screening for malignant neoplasm of rectum: Secondary | ICD-10-CM | POA: Diagnosis not present

## 2014-02-16 DIAGNOSIS — E785 Hyperlipidemia, unspecified: Secondary | ICD-10-CM | POA: Diagnosis not present

## 2014-02-16 DIAGNOSIS — Z23 Encounter for immunization: Secondary | ICD-10-CM | POA: Diagnosis not present

## 2014-02-16 DIAGNOSIS — N320 Bladder-neck obstruction: Secondary | ICD-10-CM | POA: Diagnosis not present

## 2014-02-16 DIAGNOSIS — R7309 Other abnormal glucose: Secondary | ICD-10-CM | POA: Diagnosis not present

## 2014-02-16 DIAGNOSIS — I10 Essential (primary) hypertension: Secondary | ICD-10-CM | POA: Diagnosis not present

## 2014-02-16 DIAGNOSIS — Z7901 Long term (current) use of anticoagulants: Secondary | ICD-10-CM | POA: Diagnosis not present

## 2014-02-16 DIAGNOSIS — Z1331 Encounter for screening for depression: Secondary | ICD-10-CM | POA: Diagnosis not present

## 2014-02-16 DIAGNOSIS — N183 Chronic kidney disease, stage 3 unspecified: Secondary | ICD-10-CM | POA: Diagnosis not present

## 2014-02-16 LAB — IFOBT (OCCULT BLOOD): IFOBT: POSITIVE

## 2014-03-02 DIAGNOSIS — R972 Elevated prostate specific antigen [PSA]: Secondary | ICD-10-CM | POA: Diagnosis not present

## 2014-03-02 DIAGNOSIS — N401 Enlarged prostate with lower urinary tract symptoms: Secondary | ICD-10-CM | POA: Diagnosis not present

## 2014-03-02 DIAGNOSIS — R339 Retention of urine, unspecified: Secondary | ICD-10-CM | POA: Diagnosis not present

## 2014-03-02 DIAGNOSIS — I861 Scrotal varices: Secondary | ICD-10-CM | POA: Diagnosis not present

## 2014-03-02 DIAGNOSIS — N138 Other obstructive and reflux uropathy: Secondary | ICD-10-CM | POA: Diagnosis not present

## 2014-03-08 ENCOUNTER — Other Ambulatory Visit: Payer: Self-pay | Admitting: Internal Medicine

## 2014-03-16 DIAGNOSIS — Z9889 Other specified postprocedural states: Secondary | ICD-10-CM | POA: Diagnosis not present

## 2014-03-16 DIAGNOSIS — Z7901 Long term (current) use of anticoagulants: Secondary | ICD-10-CM | POA: Diagnosis not present

## 2014-03-16 DIAGNOSIS — I059 Rheumatic mitral valve disease, unspecified: Secondary | ICD-10-CM | POA: Diagnosis not present

## 2014-03-16 DIAGNOSIS — Z954 Presence of other heart-valve replacement: Secondary | ICD-10-CM | POA: Diagnosis not present

## 2014-03-16 DIAGNOSIS — I359 Nonrheumatic aortic valve disorder, unspecified: Secondary | ICD-10-CM | POA: Diagnosis not present

## 2014-03-22 DIAGNOSIS — N138 Other obstructive and reflux uropathy: Secondary | ICD-10-CM | POA: Diagnosis not present

## 2014-03-22 DIAGNOSIS — R339 Retention of urine, unspecified: Secondary | ICD-10-CM | POA: Diagnosis not present

## 2014-03-22 DIAGNOSIS — Q619 Cystic kidney disease, unspecified: Secondary | ICD-10-CM | POA: Diagnosis not present

## 2014-03-22 DIAGNOSIS — N401 Enlarged prostate with lower urinary tract symptoms: Secondary | ICD-10-CM | POA: Diagnosis not present

## 2014-03-31 ENCOUNTER — Telehealth: Payer: Self-pay | Admitting: Internal Medicine

## 2014-03-31 NOTE — Telephone Encounter (Signed)
Patient reports in February he was told he had heme positive stool. Yesterday he states stools are very dark and tarry.  He is on coumadin.  He denies iron supplements or bismuth products. He will come in and see Tye Savoy RNP on 04/01/14 3:00

## 2014-04-01 ENCOUNTER — Encounter: Payer: Self-pay | Admitting: *Deleted

## 2014-04-01 ENCOUNTER — Ambulatory Visit (INDEPENDENT_AMBULATORY_CARE_PROVIDER_SITE_OTHER): Payer: Medicare Other | Admitting: Nurse Practitioner

## 2014-04-01 ENCOUNTER — Other Ambulatory Visit (INDEPENDENT_AMBULATORY_CARE_PROVIDER_SITE_OTHER): Payer: Medicare Other

## 2014-04-01 ENCOUNTER — Encounter: Payer: Self-pay | Admitting: Nurse Practitioner

## 2014-04-01 VITALS — BP 112/70 | HR 64 | Ht 65.5 in | Wt 198.0 lb

## 2014-04-01 DIAGNOSIS — R195 Other fecal abnormalities: Secondary | ICD-10-CM

## 2014-04-01 DIAGNOSIS — K922 Gastrointestinal hemorrhage, unspecified: Secondary | ICD-10-CM | POA: Insufficient documentation

## 2014-04-01 DIAGNOSIS — K921 Melena: Secondary | ICD-10-CM

## 2014-04-01 LAB — CBC WITH DIFFERENTIAL/PLATELET
Basophils Absolute: 0 10*3/uL (ref 0.0–0.1)
Basophils Relative: 0.4 % (ref 0.0–3.0)
Eosinophils Absolute: 0.2 10*3/uL (ref 0.0–0.7)
Eosinophils Relative: 2.3 % (ref 0.0–5.0)
HCT: 35.1 % — ABNORMAL LOW (ref 39.0–52.0)
Hemoglobin: 11.9 g/dL — ABNORMAL LOW (ref 13.0–17.0)
Lymphocytes Relative: 22.5 % (ref 12.0–46.0)
Lymphs Abs: 1.5 10*3/uL (ref 0.7–4.0)
MCHC: 33.9 g/dL (ref 30.0–36.0)
MCV: 99.2 fl (ref 78.0–100.0)
Monocytes Absolute: 0.5 10*3/uL (ref 0.1–1.0)
Monocytes Relative: 7.2 % (ref 3.0–12.0)
Neutro Abs: 4.6 10*3/uL (ref 1.4–7.7)
Neutrophils Relative %: 67.6 % (ref 43.0–77.0)
Platelets: 253 10*3/uL (ref 150.0–400.0)
RBC: 3.54 Mil/uL — ABNORMAL LOW (ref 4.22–5.81)
RDW: 14.6 % (ref 11.5–14.6)
WBC: 6.8 10*3/uL (ref 4.5–10.5)

## 2014-04-01 LAB — BASIC METABOLIC PANEL
BUN: 35 mg/dL — ABNORMAL HIGH (ref 6–23)
CO2: 30 mEq/L (ref 19–32)
Calcium: 10.1 mg/dL (ref 8.4–10.5)
Chloride: 101 mEq/L (ref 96–112)
Creatinine, Ser: 1.8 mg/dL — ABNORMAL HIGH (ref 0.4–1.5)
GFR: 39.22 mL/min — ABNORMAL LOW (ref >60.00)
Glucose, Bld: 101 mg/dL — ABNORMAL HIGH (ref 70–99)
Potassium: 4 mEq/L (ref 3.5–5.1)
Sodium: 140 mEq/L (ref 135–145)

## 2014-04-01 LAB — PROTIME-INR
INR: 3.9 ratio — ABNORMAL HIGH (ref 0.8–1.0)
Prothrombin Time: 40.3 s — ABNORMAL HIGH (ref 10.2–12.4)

## 2014-04-01 NOTE — Telephone Encounter (Signed)
Error

## 2014-04-01 NOTE — Progress Notes (Signed)
Reviewed. I have spoken to the pt. Plan to hold Coumadin on 4/11 and 04/03/2014.Recheck PT on 04/04/2014, then adjust Coumadin dose. Possible EGD/enteroscopy  On  04/05/2014

## 2014-04-01 NOTE — Telephone Encounter (Deleted)
04/01/2014   RE: Darleen Crocker DOB: 05/19/1948 MRN: [id number]   Dear Dr. Sallyanne Kuster,    We have scheduled the above patient for an endoscopic procedure. Our records show that he is on anticoagulation therapy.   Please advise as to how long the patient may come off his therapy of Coumadin prior to the procedure, which is scheduled for 05-11-2014.  Please fax back/ or route the completed form to [place], [place].   Sincerely,    Carlyle Dolly

## 2014-04-01 NOTE — Patient Instructions (Addendum)
Please go to [facility] for admitting per your request

## 2014-04-01 NOTE — Progress Notes (Signed)
     History of Present Illness:  Patient is an 80 year old male with multiple medical problems, on multiple medications including chronic coumadin for heart valves. He is well known to Dr. Olevia Perches. Patient has a history of unexplained GI blood loss worked up at [facility] in 2007 (including a small bowel capsule study). I saw him Aug 2013 with black stools. EGD revealed only mild duodenal inflammation, hgb declined about 2 grams, no further workup done. Patient was referred back here in April 2014 for heme positive stool, hgb normal at that time.  Decision was made to follow blood counts but consider small bowel capsule study depending on clinical course. Patient does have a history of adenomatous colon polyps.  Risks of repeat colonoscopy have been felt to outweigh benefits so not pursued.   Patient comes today with black stool. He had 4 black BMs last night and 3-4 more today. He feels weak. No nausea, no abdominal pain. He doesn't take iron or bismuth. Patient thinks last INR was less than 3 on 03/21/14.   Current Medications, Allergies, Past Medical History, Past Surgical History, Family History and Social History were reviewed in [employer] record.  Physical Exam: General: Pleasant, well developed , white male in no acute distress Head: Normocephalic and atraumatic Eyes:  sclerae anicteric, conjunctiva pink  Ears: Normal auditory acuity Lungs: Clear throughout to auscultation Heart: Regular rate and rhythm Abdomen: Soft, non distended, non-tender. No masses, no hepatomegaly. Normal bowel sounds Rectal: scant black tinged, heme + secretions in vault Musculoskeletal: Symmetrical with no gross deformities  Extremities: No edema  Neurological: Alert oriented x 4, grossly nonfocal Psychological:  Alert and cooperative. Normal mood and affect  Assessment and Recommendations:   68. 80 year old male with melena on coumadin. He is hemodynamically stable but feels a little weak.  Patient has had problems with intermittent GI bleeding through the years, worked up at [facility] several years back. We found only duodenitis on EGD last year(done for melena). Stat H&H, BMET, INR ordered. He may need admission but given multiple medical issues would ask Hospitalist to admit.   2. History of adenomatous colon polyps 2002 and 2004.   See #1. Last colonoscopy was at [facility] in 2008, ? Polyps.   3. Mechanical heart valves x2, on coumadin. Followed at [facility] but PCP Dr. Philip Aspen monitors INR.    Addendum: Pending lab results I spoke with wife and daughter. I suggested that his PCP or Hospitalist admit patient and we would manage GI issues. I spoke with Dr. Philip Aspen, his PCP, who prefers that Hospitalist admit if admission necessary. Patient prefers to go to [facility] where his cardiologist is located.   Labs came back around 3:30pm. Hgb is 11.9, down 1 gram from 01/31/14 labs by PCP. I don't know that ED at [facility] would admit him with hgb of 11.9, I hate for him to make the trip down there for nothing and we could likely get this all done outpatient since he is stable.. I spoke to patient's cardiologist at [facility], Dr. Wadie Lessen, he felt anticoagulation  / GI bleeding could be managed in [place].     INR is slightly supratherapeutic at 3.9. I spoke with Dr. Olevia Perches,  patient's primary GI. She will have patient hold coumadin over weekend and recheck INR / CBC on Monday morning. Following that we will arrange for an EGD. If negative he may need capsule study.

## 2014-04-03 ENCOUNTER — Other Ambulatory Visit: Payer: Self-pay | Admitting: Nurse Practitioner

## 2014-04-03 DIAGNOSIS — K922 Gastrointestinal hemorrhage, unspecified: Secondary | ICD-10-CM

## 2014-04-04 ENCOUNTER — Other Ambulatory Visit (INDEPENDENT_AMBULATORY_CARE_PROVIDER_SITE_OTHER): Payer: Medicare Other

## 2014-04-04 ENCOUNTER — Telehealth: Payer: Self-pay | Admitting: *Deleted

## 2014-04-04 DIAGNOSIS — K922 Gastrointestinal hemorrhage, unspecified: Secondary | ICD-10-CM

## 2014-04-04 LAB — CBC
HCT: 33.1 % — ABNORMAL LOW (ref 39.0–52.0)
Hemoglobin: 11.3 g/dL — ABNORMAL LOW (ref 13.0–17.0)
MCHC: 34.2 g/dL (ref 30.0–36.0)
MCV: 99.2 fl (ref 78.0–100.0)
Platelets: 224 10*3/uL (ref 150.0–400.0)
RBC: 3.34 Mil/uL — ABNORMAL LOW (ref 4.22–5.81)
RDW: 14.7 % — ABNORMAL HIGH (ref 11.5–14.6)
WBC: 9.2 10*3/uL (ref 4.5–10.5)

## 2014-04-04 LAB — PROTIME-INR
INR: 1.5 ratio — ABNORMAL HIGH (ref 0.8–1.0)
Prothrombin Time: 15.9 s — ABNORMAL HIGH (ref 10.2–12.4)

## 2014-04-04 NOTE — Telephone Encounter (Signed)
Message copied by Larina Bras on Mon Apr 04, 2014  2:11 PM ------      Message from: Willia Craze      Created: Mon Apr 04, 2014  1:49 PM       Hgb is okay, no significant drop and INR okay to proceed with EGD. Dottie, please arrange for patient to have EGD tomorrow at 2pm with Dr. Olevia Perches. Make sure he knows not to take any coumadin until after procedure. Thanks ------

## 2014-04-04 NOTE — Telephone Encounter (Signed)
Patient came for labs this AM. He is asking if he will need procedure tomorrow. Please, advise.

## 2014-04-04 NOTE — Telephone Encounter (Signed)
Reviewed and agree with the above conversations- EGD canceled as of now pending H/H follow up and OV follow up. He will restart Coumadin today.

## 2014-04-04 NOTE — Telephone Encounter (Signed)
Patient's cardiologist from [facility], Dr Barbra Sarks called to advise that he is uncomfortable with patient having a procedure completed and being off coumadin because of high risk for thrombosis (has mechanical valve). He was given Tye Savoy, NP's phone number to discuss this with her since she saw patient on Friday. Patient's condition was discussed with Dr Barbra Sarks and Nevin Bloodgood consulted Dr Olevia Perches who was to perform the procedure to let her know of the conversation. It has been decided at this time to cancel endoscopy scheduled for 04/05/14 and have patient start back on Coumadin today.  I have advised patient to restart coumadin today and that we have cancelled endoscopy for 04/05/14. Patient verbalizes understanding of this. He states that he continues to have black stools. All symptoms the same as when he was seen on Friday.

## 2014-04-04 NOTE — Telephone Encounter (Signed)
Patient has been advised of stable hemoglobin and pt/inr. Patient scheduled for endoscopy 04/05/14 in [place] @ 2:00 pm. Patient has been advised and I have verbally gone over all instructions with him. He will come later today to sign appropriate paperwork. Left message advising Holland Falling with [facility] of this add on for tomorrow. I have also advised Alphonsa Gin, RN of this.

## 2014-04-05 ENCOUNTER — Other Ambulatory Visit: Payer: Self-pay | Admitting: Nurse Practitioner

## 2014-04-05 ENCOUNTER — Encounter: Payer: Self-pay | Admitting: *Deleted

## 2014-04-05 ENCOUNTER — Encounter: Payer: Medicare Other | Admitting: Internal Medicine

## 2014-04-05 DIAGNOSIS — K922 Gastrointestinal hemorrhage, unspecified: Secondary | ICD-10-CM

## 2014-04-05 NOTE — Telephone Encounter (Signed)
Dottie, patient knows to come for labs tomorrow. I have already put orders in. I will follow up on results. No need to get him an appt yet.   thanks

## 2014-04-05 NOTE — Telephone Encounter (Signed)
He is not currently scheduled for repeat labs or office visit. When would you like him to be seen? I will work him in to your schedule. Does he need labwork prior to appointment or do you want it after you see him?

## 2014-04-05 NOTE — Telephone Encounter (Signed)
Please add on to my schedule tomorrow. Thanx

## 2014-04-05 NOTE — Telephone Encounter (Signed)
I have spoken to patient and have advised that Dr Olevia Perches would like him to have office visit tomorrow in addition to [facility]. He will come at 1:45 pm 04/06/14 for visit with Dr Olevia Perches and will have labs prior to that.

## 2014-04-06 ENCOUNTER — Ambulatory Visit (INDEPENDENT_AMBULATORY_CARE_PROVIDER_SITE_OTHER): Payer: Medicare Other | Admitting: Internal Medicine

## 2014-04-06 ENCOUNTER — Telehealth: Payer: Self-pay | Admitting: Nurse Practitioner

## 2014-04-06 ENCOUNTER — Encounter: Payer: Self-pay | Admitting: Internal Medicine

## 2014-04-06 ENCOUNTER — Other Ambulatory Visit: Payer: Self-pay | Admitting: Nurse Practitioner

## 2014-04-06 ENCOUNTER — Other Ambulatory Visit (INDEPENDENT_AMBULATORY_CARE_PROVIDER_SITE_OTHER): Payer: Medicare Other

## 2014-04-06 VITALS — BP 96/54 | HR 68 | Ht 65.25 in | Wt 197.1 lb

## 2014-04-06 DIAGNOSIS — K921 Melena: Secondary | ICD-10-CM

## 2014-04-06 DIAGNOSIS — K922 Gastrointestinal hemorrhage, unspecified: Secondary | ICD-10-CM

## 2014-04-06 DIAGNOSIS — D689 Coagulation defect, unspecified: Secondary | ICD-10-CM | POA: Diagnosis not present

## 2014-04-06 LAB — BASIC METABOLIC PANEL
BUN: 37 mg/dL — ABNORMAL HIGH (ref 6–23)
CO2: 28 mEq/L (ref 19–32)
Calcium: 10 mg/dL (ref 8.4–10.5)
Chloride: 105 mEq/L (ref 96–112)
Creatinine, Ser: 1.7 mg/dL — ABNORMAL HIGH (ref 0.4–1.5)
GFR: 40.54 mL/min — ABNORMAL LOW (ref >60.00)
Glucose, Bld: 112 mg/dL — ABNORMAL HIGH (ref 70–99)
Potassium: 4.5 mEq/L (ref 3.5–5.1)
Sodium: 141 mEq/L (ref 135–145)

## 2014-04-06 LAB — PROTIME-INR
INR: 1.7 ratio — ABNORMAL HIGH (ref 0.8–1.0)
Prothrombin Time: 17.9 s — ABNORMAL HIGH (ref 10.2–12.4)

## 2014-04-06 LAB — CBC
HCT: 32.8 % — ABNORMAL LOW (ref 39.0–52.0)
Hemoglobin: 11.3 g/dL — ABNORMAL LOW (ref 13.0–17.0)
MCHC: 34.5 g/dL (ref 30.0–36.0)
MCV: 99.5 fl (ref 78.0–100.0)
Platelets: 237 10*3/uL (ref 150.0–400.0)
RBC: 3.3 Mil/uL — ABNORMAL LOW (ref 4.22–5.81)
RDW: 14.9 % — ABNORMAL HIGH (ref 11.5–14.6)
WBC: 6.5 10*3/uL (ref 4.5–10.5)

## 2014-04-06 NOTE — Telephone Encounter (Signed)
Spoke with patient at 8am. He had no black stools yesterday. Coumadin was restarted on Monday. Patient for BMET, CBC, and INR today followed by appointment with Dr. Olevia Perches.

## 2014-04-06 NOTE — Patient Instructions (Addendum)
Take your coumadin at 5 mg every day (including Sat. and Sunday) per Dr Delfin Edis.  Your physician has requested that you go to the basement for the following lab work on 04/12/14: CBC, INR  You have been scheduled for a small bowel capsule endoscopy on 04/13/14 . Please follow written instructions given to you today.  CC: Dr Leanna Battles, Dr Cherly Hensen

## 2014-04-06 NOTE — Progress Notes (Signed)
Steve Riddle 1929-08-17 [id number]  Note: This dictation was prepared with Dragon digital system. Any transcriptional errors that result from this procedure are unintentional.   History of Present Illness: This is an 80 year old white male with intermittent GI blood loss. He had melenic stools last week which continued for several days and his hemoglobin dropped from 11.9 to 11.3. He has chronic renal insufficiency and his BUN has ranged from 35-38. He was seen in the office by Tye Savoy, NP last week and tested Hemoccult positive. At that point, his INR was 3.9. He is on lifelong anticoagulation with Coumadin for 2 mechanical valves. His INR usually ranges between 2.5 and 3.5. He had a similar episode of bleeding one year ago and prior to that in 2007 when his INR went up to 4.3. A colonoscopy at [facility] as well as a small bowel capsule endoscopy were both nondiagnostic. He denies abdominal pain, chest pain, nausea or vomiting. We have discontinued the Coumadin for 2 days and recheck his INR which was 1.5 yesterday. He restarted his Coumadin yesterday and today, the INR is up to 1.7. He has been on Coumadin 5 mg daily except for Saturday and Sunday when he takes 7 .5 mg. His stools were yellow in color yesterday and today. His hemoglobin is stable at 11.3.     Past Medical History  Diagnosis Date  . Endocarditis   . Gout   . Hyperlipemia   . Hypertension   . Arthritis   . Heart murmur   . Hx of adenomatous colonic polyps   . Diverticulosis   . Hyperparathyroidism   . Nephrosclerosis   . Anemia   . GI bleed     Past Surgical History  Procedure Laterality Date  . Coronary artery bypass graft    . Mitral valve replacement    . Aortic and mitral valve replacement    . Cataract extraction Left     with lens implant  . Orif arm      Allergies  Allergen Reactions  . Bee Venom   . Ace Inhibitors Cough    Family history and social history have been reviewed.  Review of Systems:    The remainder of the 10 point ROS is negative except as outlined in the H&P  Physical Exam: General Appearance Well developed, in no distress Eyes  Non icteric  HEENT  Non traumatic, normocephalic  Mouth No lesion, tongue papillated, no cheilosis Neck Supple without adenopathy, thyroid not enlarged, no carotid bruits, no JVD Lungs Clear to auscultation bilaterally COR Normal S1, normal S2, regular rhythm, no murmur, quiet precordium, a chemical bowel sounds Abdomen soft nontender with normoactive bowel sounds. Firm liver edge in right upper quadrant and epigastrium. No ascites splenic tip not palpable Rectal Brown trace Hemoccult-positive stool Extremities  No pedal edema Skin No lesions Neurological Alert and oriented x 3 Psychological Normal mood and affect  Assessment and Plan:   Problem #1 Intermittent occult GI blood loss in the setting of chronic anticoagulation. He is on lifelong Coumadin. His INR was out of therapeutic range. This occurred before and he has had bleeding in the past in the same setting. He has resumed Coumadin and will reduce his dose to 5 mg daily. He will not increase his dose on Saturdays and Sundays as before. We will proceed with a small bowel capsule endoscopy which is noninvasive and we don't have to bridge him  with an anticoagulant. If he develops overt GI bleeding, he will have to  be admitted to the hospital and bridged with heparin. He and his wife understand and agree with the plan. We will recheck his blood count and INR on April 21st in our office. He will continue omeprazole 20 mg daily.    Lafayette Dragon 04/06/2014

## 2014-04-13 ENCOUNTER — Ambulatory Visit (INDEPENDENT_AMBULATORY_CARE_PROVIDER_SITE_OTHER): Payer: Medicare Other | Admitting: Gastroenterology

## 2014-04-13 ENCOUNTER — Other Ambulatory Visit (INDEPENDENT_AMBULATORY_CARE_PROVIDER_SITE_OTHER): Payer: Medicare Other

## 2014-04-13 DIAGNOSIS — K921 Melena: Secondary | ICD-10-CM | POA: Diagnosis not present

## 2014-04-13 DIAGNOSIS — D689 Coagulation defect, unspecified: Secondary | ICD-10-CM | POA: Diagnosis not present

## 2014-04-13 DIAGNOSIS — R195 Other fecal abnormalities: Secondary | ICD-10-CM

## 2014-04-13 DIAGNOSIS — K922 Gastrointestinal hemorrhage, unspecified: Secondary | ICD-10-CM | POA: Diagnosis not present

## 2014-04-13 DIAGNOSIS — K838 Other specified diseases of biliary tract: Secondary | ICD-10-CM

## 2014-04-13 LAB — CBC WITH DIFFERENTIAL/PLATELET
Basophils Absolute: 0 10*3/uL (ref 0.0–0.1)
Basophils Relative: 0.3 % (ref 0.0–3.0)
Eosinophils Absolute: 0.1 10*3/uL (ref 0.0–0.7)
Eosinophils Relative: 2.4 % (ref 0.0–5.0)
HCT: 34.2 % — ABNORMAL LOW (ref 39.0–52.0)
Hemoglobin: 11.4 g/dL — ABNORMAL LOW (ref 13.0–17.0)
Lymphocytes Relative: 16 % (ref 12.0–46.0)
Lymphs Abs: 0.9 10*3/uL (ref 0.7–4.0)
MCHC: 33.4 g/dL (ref 30.0–36.0)
MCV: 100.8 fl — ABNORMAL HIGH (ref 78.0–100.0)
Monocytes Absolute: 0.4 10*3/uL (ref 0.1–1.0)
Monocytes Relative: 6 % (ref 3.0–12.0)
Neutro Abs: 4.5 10*3/uL (ref 1.4–7.7)
Neutrophils Relative %: 75.3 % (ref 43.0–77.0)
Platelets: 235 10*3/uL (ref 150.0–400.0)
RBC: 3.4 Mil/uL — ABNORMAL LOW (ref 4.22–5.81)
RDW: 15.5 % — ABNORMAL HIGH (ref 11.5–14.6)
WBC: 5.9 10*3/uL (ref 4.5–10.5)

## 2014-04-13 LAB — PROTIME-INR
INR: 2.8 ratio — ABNORMAL HIGH (ref 0.8–1.0)
Prothrombin Time: 30.2 s — ABNORMAL HIGH (ref 9.6–13.1)

## 2014-04-13 NOTE — Progress Notes (Signed)
Patient presented to day for capsule endo.  Tolerated procedure well.  Verbalized understanding of all written and verbal instructions. Capsule ID [id number] lot # [id number] exp 04/2015.

## 2014-04-20 ENCOUNTER — Encounter: Payer: Self-pay | Admitting: Internal Medicine

## 2014-04-20 DIAGNOSIS — Z954 Presence of other heart-valve replacement: Secondary | ICD-10-CM | POA: Diagnosis not present

## 2014-04-20 DIAGNOSIS — Z9889 Other specified postprocedural states: Secondary | ICD-10-CM | POA: Diagnosis not present

## 2014-04-20 DIAGNOSIS — I059 Rheumatic mitral valve disease, unspecified: Secondary | ICD-10-CM | POA: Diagnosis not present

## 2014-04-20 DIAGNOSIS — Z7901 Long term (current) use of anticoagulants: Secondary | ICD-10-CM | POA: Diagnosis not present

## 2014-04-20 DIAGNOSIS — I359 Nonrheumatic aortic valve disorder, unspecified: Secondary | ICD-10-CM | POA: Diagnosis not present

## 2014-05-09 ENCOUNTER — Other Ambulatory Visit: Payer: Self-pay | Admitting: Internal Medicine

## 2014-05-24 DIAGNOSIS — Z9889 Other specified postprocedural states: Secondary | ICD-10-CM | POA: Diagnosis not present

## 2014-05-24 DIAGNOSIS — Z7901 Long term (current) use of anticoagulants: Secondary | ICD-10-CM | POA: Diagnosis not present

## 2014-05-24 DIAGNOSIS — I059 Rheumatic mitral valve disease, unspecified: Secondary | ICD-10-CM | POA: Diagnosis not present

## 2014-05-24 DIAGNOSIS — I359 Nonrheumatic aortic valve disorder, unspecified: Secondary | ICD-10-CM | POA: Diagnosis not present

## 2014-05-30 DIAGNOSIS — I129 Hypertensive chronic kidney disease with stage 1 through stage 4 chronic kidney disease, or unspecified chronic kidney disease: Secondary | ICD-10-CM | POA: Diagnosis not present

## 2014-05-30 DIAGNOSIS — N039 Chronic nephritic syndrome with unspecified morphologic changes: Secondary | ICD-10-CM | POA: Diagnosis not present

## 2014-05-30 DIAGNOSIS — N183 Chronic kidney disease, stage 3 unspecified: Secondary | ICD-10-CM | POA: Diagnosis not present

## 2014-05-30 DIAGNOSIS — D631 Anemia in chronic kidney disease: Secondary | ICD-10-CM | POA: Diagnosis not present

## 2014-05-30 DIAGNOSIS — N2581 Secondary hyperparathyroidism of renal origin: Secondary | ICD-10-CM | POA: Diagnosis not present

## 2014-06-16 DIAGNOSIS — R42 Dizziness and giddiness: Secondary | ICD-10-CM | POA: Diagnosis not present

## 2014-06-16 DIAGNOSIS — I10 Essential (primary) hypertension: Secondary | ICD-10-CM | POA: Diagnosis not present

## 2014-06-16 DIAGNOSIS — Z7901 Long term (current) use of anticoagulants: Secondary | ICD-10-CM | POA: Diagnosis not present

## 2014-06-16 DIAGNOSIS — I359 Nonrheumatic aortic valve disorder, unspecified: Secondary | ICD-10-CM | POA: Diagnosis not present

## 2014-07-08 DIAGNOSIS — H2510 Age-related nuclear cataract, unspecified eye: Secondary | ICD-10-CM | POA: Diagnosis not present

## 2014-07-08 DIAGNOSIS — E119 Type 2 diabetes mellitus without complications: Secondary | ICD-10-CM | POA: Diagnosis not present

## 2014-07-08 DIAGNOSIS — H35039 Hypertensive retinopathy, unspecified eye: Secondary | ICD-10-CM | POA: Diagnosis not present

## 2014-07-08 DIAGNOSIS — Z961 Presence of intraocular lens: Secondary | ICD-10-CM | POA: Diagnosis not present

## 2014-07-12 DIAGNOSIS — Z9889 Other specified postprocedural states: Secondary | ICD-10-CM | POA: Diagnosis not present

## 2014-07-12 DIAGNOSIS — I059 Rheumatic mitral valve disease, unspecified: Secondary | ICD-10-CM | POA: Diagnosis not present

## 2014-07-12 DIAGNOSIS — Z7901 Long term (current) use of anticoagulants: Secondary | ICD-10-CM | POA: Diagnosis not present

## 2014-07-12 DIAGNOSIS — I359 Nonrheumatic aortic valve disorder, unspecified: Secondary | ICD-10-CM | POA: Diagnosis not present

## 2014-07-12 DIAGNOSIS — Z954 Presence of other heart-valve replacement: Secondary | ICD-10-CM | POA: Diagnosis not present

## 2014-07-13 DIAGNOSIS — N181 Chronic kidney disease, stage 1: Secondary | ICD-10-CM | POA: Diagnosis not present

## 2014-07-13 DIAGNOSIS — I10 Essential (primary) hypertension: Secondary | ICD-10-CM | POA: Diagnosis not present

## 2014-07-13 DIAGNOSIS — I2510 Atherosclerotic heart disease of native coronary artery without angina pectoris: Secondary | ICD-10-CM | POA: Diagnosis not present

## 2014-08-23 DIAGNOSIS — I059 Rheumatic mitral valve disease, unspecified: Secondary | ICD-10-CM | POA: Diagnosis not present

## 2014-08-23 DIAGNOSIS — Z7901 Long term (current) use of anticoagulants: Secondary | ICD-10-CM | POA: Diagnosis not present

## 2014-08-23 DIAGNOSIS — Z954 Presence of other heart-valve replacement: Secondary | ICD-10-CM | POA: Diagnosis not present

## 2014-08-23 DIAGNOSIS — Z9889 Other specified postprocedural states: Secondary | ICD-10-CM | POA: Diagnosis not present

## 2014-08-23 DIAGNOSIS — I359 Nonrheumatic aortic valve disorder, unspecified: Secondary | ICD-10-CM | POA: Diagnosis not present

## 2014-09-05 ENCOUNTER — Other Ambulatory Visit: Payer: Self-pay | Admitting: Internal Medicine

## 2014-09-10 IMAGING — CT CT HEAD W/O CM
1 series · 16 of 30 positions shown, 20 images · non-contrast
Comparison: 01/02/2013

CLINICAL DATA: Fall, the patient is on Coumadin.  Head hit
pavement.  No loss of consciousness.

CT HEAD WITHOUT CONTRAST
TECHNIQUE: Contiguous axial images were obtained from the base of
the skull through the vertex without contrast.

[Series 2: head 4.8 h37s · axial · 0.50mm/px · z∈[-151,-15]mm · 16 of 32 slices shown, 20 images]
[im 2/32  brain]
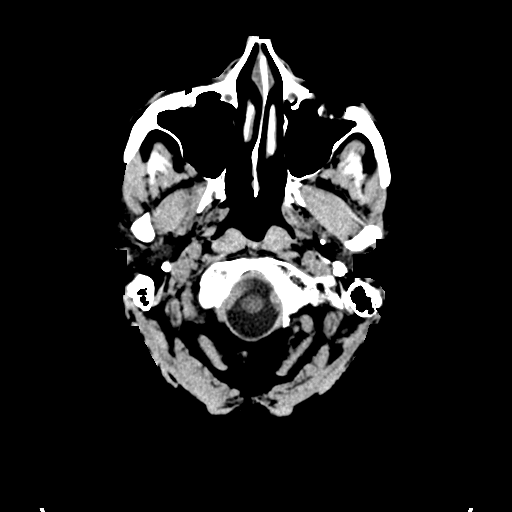
[im 2/32  bone]
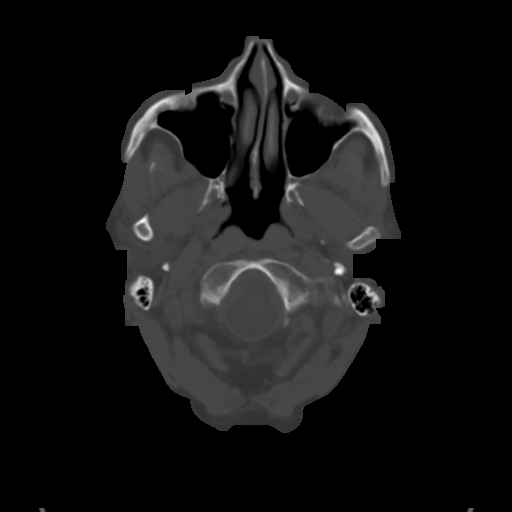
[im 4/32  brain]
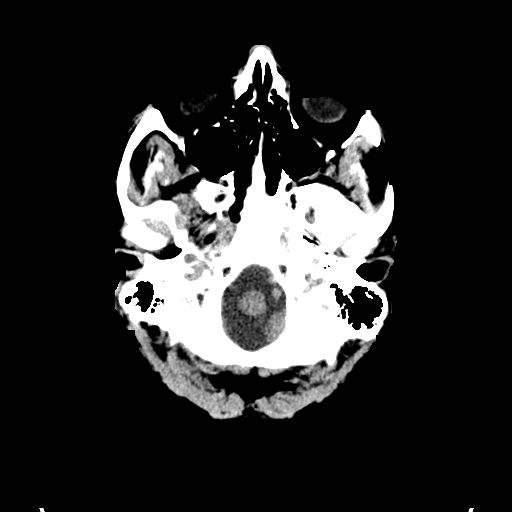
[im 6/32  brain]
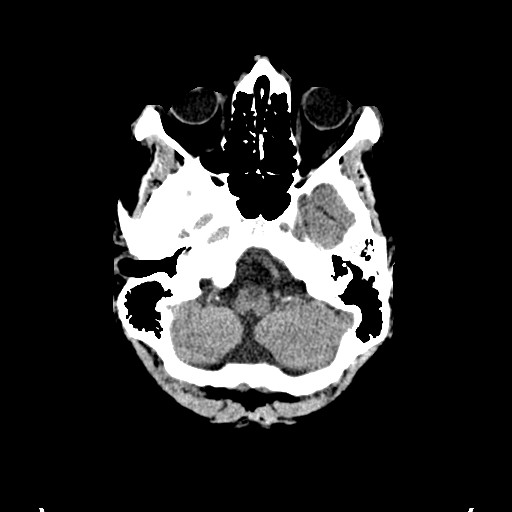
[im 8/32  brain]
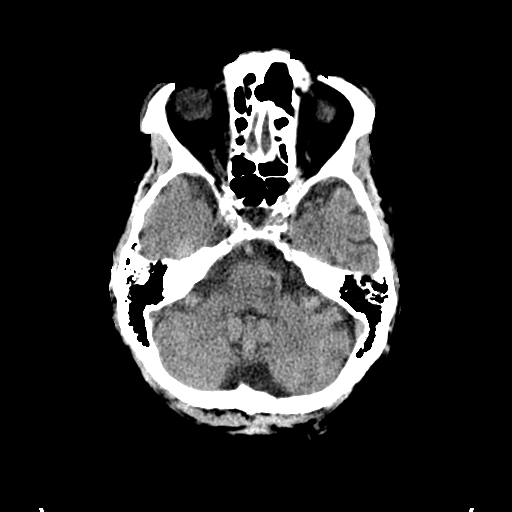
[im 9/32  brain]
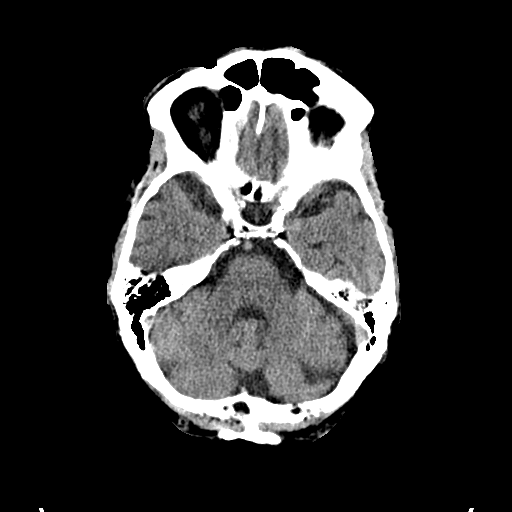
[im 9/32  bone]
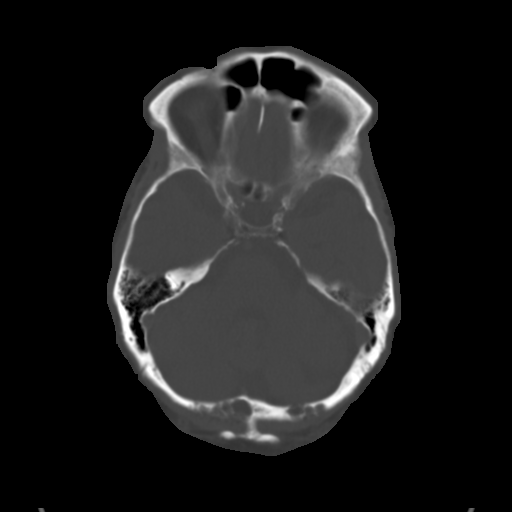
[im 11/32  brain]
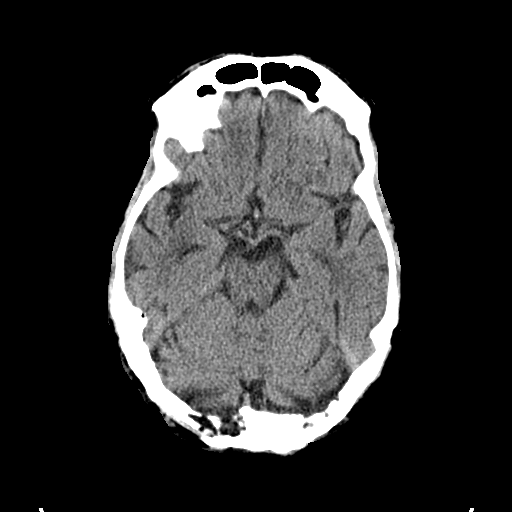
[im 13/32  brain]
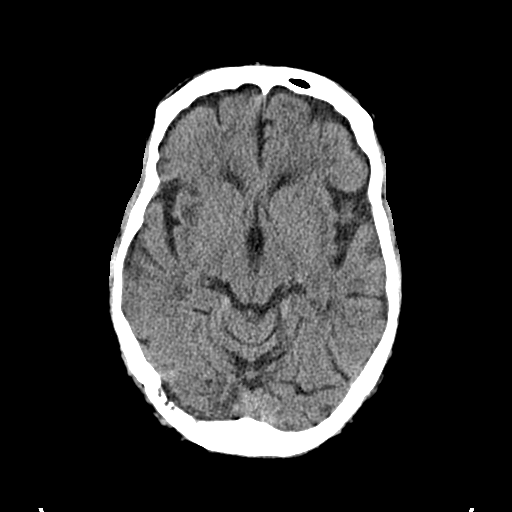
[im 15/32  brain]
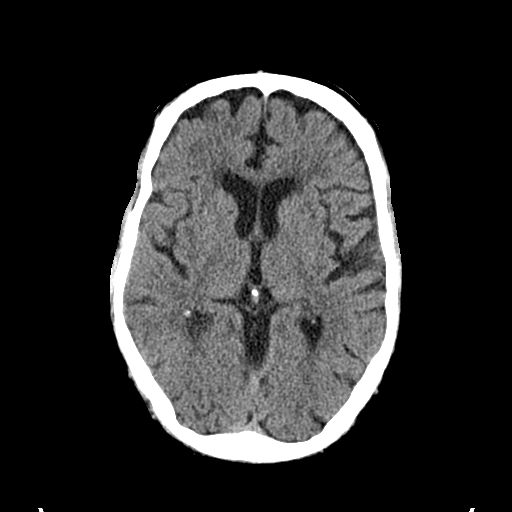
[im 17/32  brain]
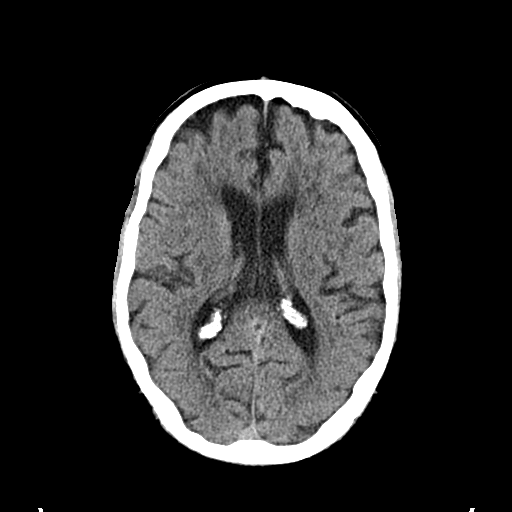
[im 17/32  bone]
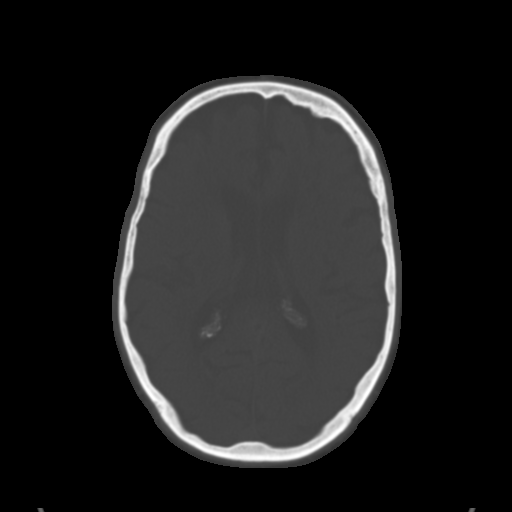
[im 19/32  brain]
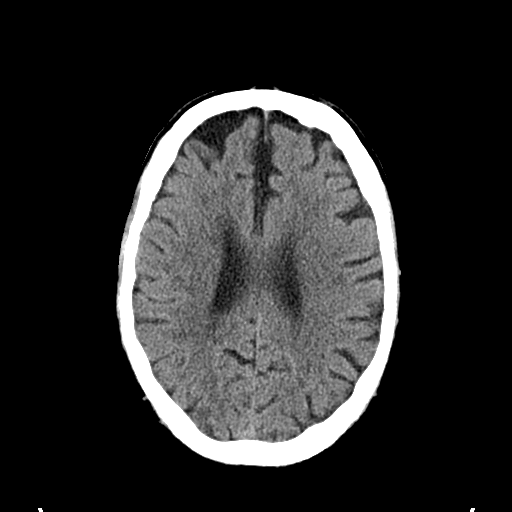
[im 21/32  brain]
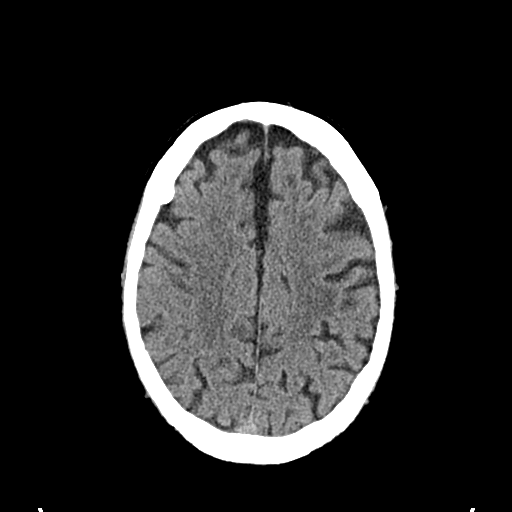
[im 23/32  brain]
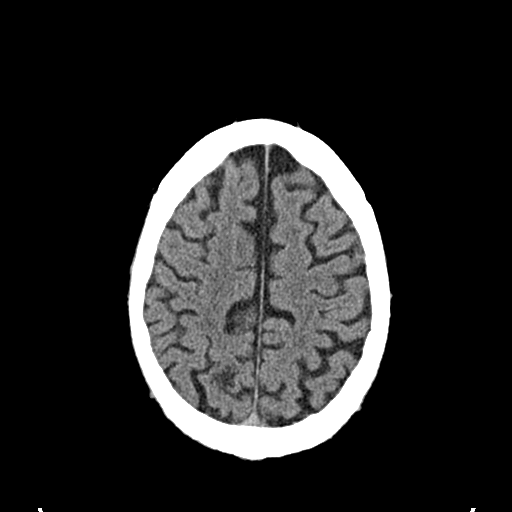
[im 24/32  brain]
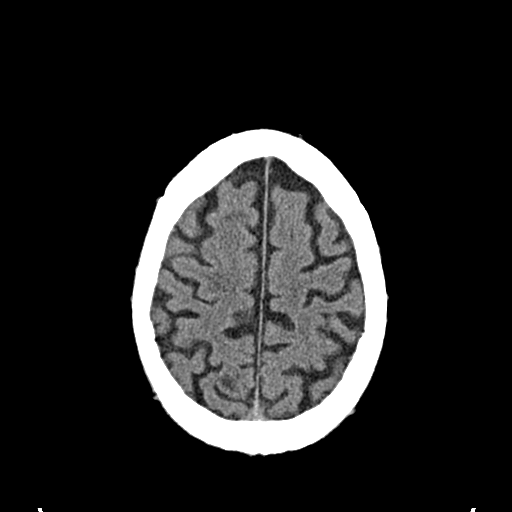
[im 24/32  bone]
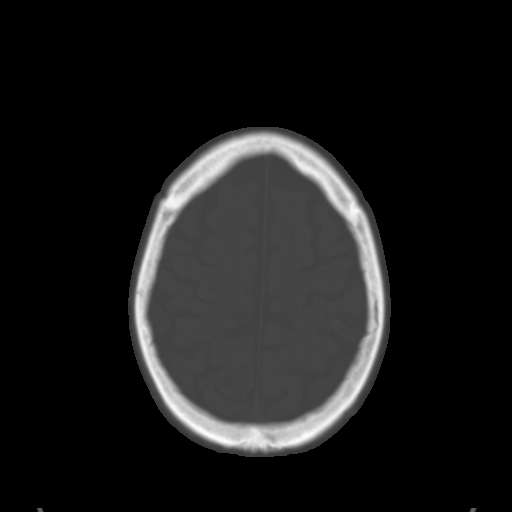
[im 26/32  brain]
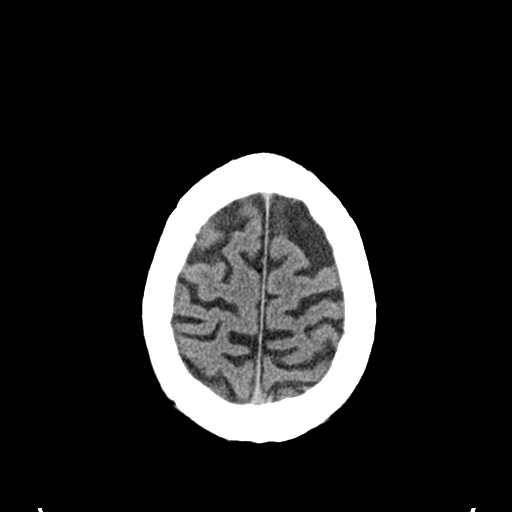
[im 28/32  brain]
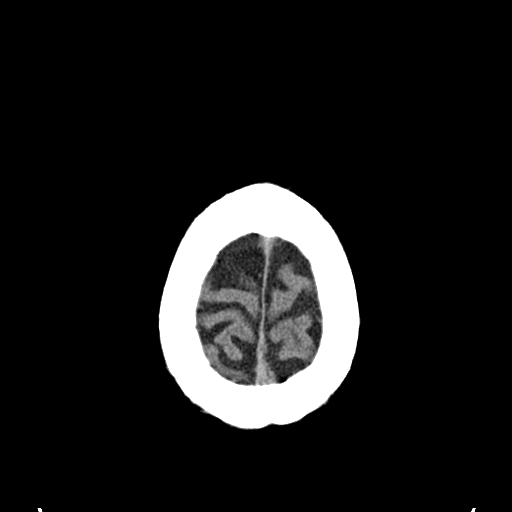
[im 30/32  brain]
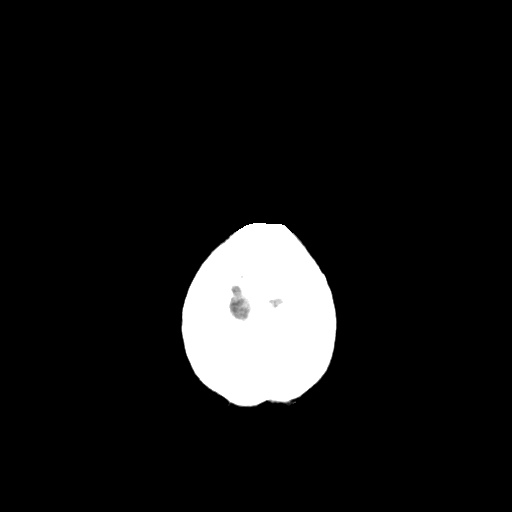

[16 of 30 positions shown; findings below may reference images not displayed]

FINDINGS: The ventricles are normal in size, for this patient's
age, and configuration.

No parenchymal masses or mass effect.

There is no evidence of a recent transcortical infarct.  There are
patchy areas of hypoattenuation in the white matter most consistent
with chronic microvascular ischemic change.  This is stable.

No extra-axial masses or abnormal fluid collections.

There is no intracranial hemorrhage.

Occipital bone lucencies reflecting large arachnoid granulations
are stable.  No skull fracture.  The visualized sinuses and mastoid
air cells are clear.
IMPRESSION: No acute intracranial abnormalities.  Specifically, no intracranial
hemorrhage.

## 2014-10-04 DIAGNOSIS — Z7901 Long term (current) use of anticoagulants: Secondary | ICD-10-CM | POA: Diagnosis not present

## 2014-10-04 DIAGNOSIS — I348 Other nonrheumatic mitral valve disorders: Secondary | ICD-10-CM | POA: Diagnosis not present

## 2014-10-04 DIAGNOSIS — I359 Nonrheumatic aortic valve disorder, unspecified: Secondary | ICD-10-CM | POA: Diagnosis not present

## 2014-11-07 DIAGNOSIS — N2581 Secondary hyperparathyroidism of renal origin: Secondary | ICD-10-CM | POA: Diagnosis not present

## 2014-11-07 DIAGNOSIS — D631 Anemia in chronic kidney disease: Secondary | ICD-10-CM | POA: Diagnosis not present

## 2014-11-07 DIAGNOSIS — N183 Chronic kidney disease, stage 3 (moderate): Secondary | ICD-10-CM | POA: Diagnosis not present

## 2014-11-07 DIAGNOSIS — I129 Hypertensive chronic kidney disease with stage 1 through stage 4 chronic kidney disease, or unspecified chronic kidney disease: Secondary | ICD-10-CM | POA: Diagnosis not present

## 2014-11-14 DIAGNOSIS — I359 Nonrheumatic aortic valve disorder, unspecified: Secondary | ICD-10-CM | POA: Diagnosis not present

## 2014-11-14 DIAGNOSIS — Z7901 Long term (current) use of anticoagulants: Secondary | ICD-10-CM | POA: Diagnosis not present

## 2014-11-14 DIAGNOSIS — Z23 Encounter for immunization: Secondary | ICD-10-CM | POA: Diagnosis not present

## 2014-11-14 DIAGNOSIS — I348 Other nonrheumatic mitral valve disorders: Secondary | ICD-10-CM | POA: Diagnosis not present

## 2014-11-15 DIAGNOSIS — I129 Hypertensive chronic kidney disease with stage 1 through stage 4 chronic kidney disease, or unspecified chronic kidney disease: Secondary | ICD-10-CM | POA: Diagnosis not present

## 2014-11-15 DIAGNOSIS — N183 Chronic kidney disease, stage 3 (moderate): Secondary | ICD-10-CM | POA: Diagnosis not present

## 2014-12-27 DIAGNOSIS — I359 Nonrheumatic aortic valve disorder, unspecified: Secondary | ICD-10-CM | POA: Diagnosis not present

## 2014-12-27 DIAGNOSIS — I348 Other nonrheumatic mitral valve disorders: Secondary | ICD-10-CM | POA: Diagnosis not present

## 2014-12-27 DIAGNOSIS — Z9889 Other specified postprocedural states: Secondary | ICD-10-CM | POA: Diagnosis not present

## 2014-12-27 DIAGNOSIS — Z7901 Long term (current) use of anticoagulants: Secondary | ICD-10-CM | POA: Diagnosis not present

## 2015-01-04 ENCOUNTER — Other Ambulatory Visit: Payer: Self-pay | Admitting: Internal Medicine

## 2015-01-18 DIAGNOSIS — Z8679 Personal history of other diseases of the circulatory system: Secondary | ICD-10-CM | POA: Diagnosis not present

## 2015-01-18 DIAGNOSIS — I2510 Atherosclerotic heart disease of native coronary artery without angina pectoris: Secondary | ICD-10-CM | POA: Diagnosis not present

## 2015-01-18 DIAGNOSIS — E780 Pure hypercholesterolemia: Secondary | ICD-10-CM | POA: Diagnosis not present

## 2015-01-18 DIAGNOSIS — I480 Paroxysmal atrial fibrillation: Secondary | ICD-10-CM | POA: Diagnosis not present

## 2015-02-13 DIAGNOSIS — R7301 Impaired fasting glucose: Secondary | ICD-10-CM | POA: Diagnosis not present

## 2015-02-13 DIAGNOSIS — I10 Essential (primary) hypertension: Secondary | ICD-10-CM | POA: Diagnosis not present

## 2015-02-13 DIAGNOSIS — Z7901 Long term (current) use of anticoagulants: Secondary | ICD-10-CM | POA: Diagnosis not present

## 2015-02-13 DIAGNOSIS — Z008 Encounter for other general examination: Secondary | ICD-10-CM | POA: Diagnosis not present

## 2015-02-13 DIAGNOSIS — Z125 Encounter for screening for malignant neoplasm of prostate: Secondary | ICD-10-CM | POA: Diagnosis not present

## 2015-02-13 DIAGNOSIS — E785 Hyperlipidemia, unspecified: Secondary | ICD-10-CM | POA: Diagnosis not present

## 2015-02-13 DIAGNOSIS — M109 Gout, unspecified: Secondary | ICD-10-CM | POA: Diagnosis not present

## 2015-02-20 DIAGNOSIS — I359 Nonrheumatic aortic valve disorder, unspecified: Secondary | ICD-10-CM | POA: Diagnosis not present

## 2015-02-20 DIAGNOSIS — Z6830 Body mass index (BMI) 30.0-30.9, adult: Secondary | ICD-10-CM | POA: Diagnosis not present

## 2015-02-20 DIAGNOSIS — Z0000 Encounter for general adult medical examination without abnormal findings: Secondary | ICD-10-CM | POA: Diagnosis not present

## 2015-02-20 DIAGNOSIS — Z1389 Encounter for screening for other disorder: Secondary | ICD-10-CM | POA: Diagnosis not present

## 2015-02-20 DIAGNOSIS — I10 Essential (primary) hypertension: Secondary | ICD-10-CM | POA: Diagnosis not present

## 2015-02-20 DIAGNOSIS — R7301 Impaired fasting glucose: Secondary | ICD-10-CM | POA: Diagnosis not present

## 2015-02-20 DIAGNOSIS — Z1212 Encounter for screening for malignant neoplasm of rectum: Secondary | ICD-10-CM | POA: Diagnosis not present

## 2015-02-20 DIAGNOSIS — E785 Hyperlipidemia, unspecified: Secondary | ICD-10-CM | POA: Diagnosis not present

## 2015-02-20 DIAGNOSIS — N183 Chronic kidney disease, stage 3 (moderate): Secondary | ICD-10-CM | POA: Diagnosis not present

## 2015-02-20 DIAGNOSIS — Z7901 Long term (current) use of anticoagulants: Secondary | ICD-10-CM | POA: Diagnosis not present

## 2015-02-20 DIAGNOSIS — R972 Elevated prostate specific antigen [PSA]: Secondary | ICD-10-CM | POA: Diagnosis not present

## 2015-02-20 DIAGNOSIS — N320 Bladder-neck obstruction: Secondary | ICD-10-CM | POA: Diagnosis not present

## 2015-02-23 DIAGNOSIS — N183 Chronic kidney disease, stage 3 (moderate): Secondary | ICD-10-CM | POA: Diagnosis not present

## 2015-02-23 DIAGNOSIS — M109 Gout, unspecified: Secondary | ICD-10-CM | POA: Diagnosis not present

## 2015-02-23 DIAGNOSIS — N2581 Secondary hyperparathyroidism of renal origin: Secondary | ICD-10-CM | POA: Diagnosis not present

## 2015-02-23 DIAGNOSIS — N189 Chronic kidney disease, unspecified: Secondary | ICD-10-CM | POA: Diagnosis not present

## 2015-02-23 DIAGNOSIS — D631 Anemia in chronic kidney disease: Secondary | ICD-10-CM | POA: Diagnosis not present

## 2015-02-23 DIAGNOSIS — I129 Hypertensive chronic kidney disease with stage 1 through stage 4 chronic kidney disease, or unspecified chronic kidney disease: Secondary | ICD-10-CM | POA: Diagnosis not present

## 2015-03-06 ENCOUNTER — Other Ambulatory Visit: Payer: Self-pay | Admitting: Internal Medicine

## 2015-03-22 DIAGNOSIS — N401 Enlarged prostate with lower urinary tract symptoms: Secondary | ICD-10-CM | POA: Diagnosis not present

## 2015-03-22 DIAGNOSIS — N138 Other obstructive and reflux uropathy: Secondary | ICD-10-CM | POA: Diagnosis not present

## 2015-03-22 DIAGNOSIS — R339 Retention of urine, unspecified: Secondary | ICD-10-CM | POA: Diagnosis not present

## 2015-03-22 DIAGNOSIS — R972 Elevated prostate specific antigen [PSA]: Secondary | ICD-10-CM | POA: Diagnosis not present

## 2015-04-03 DIAGNOSIS — I348 Other nonrheumatic mitral valve disorders: Secondary | ICD-10-CM | POA: Diagnosis not present

## 2015-04-03 DIAGNOSIS — I359 Nonrheumatic aortic valve disorder, unspecified: Secondary | ICD-10-CM | POA: Diagnosis not present

## 2015-04-03 DIAGNOSIS — Z7901 Long term (current) use of anticoagulants: Secondary | ICD-10-CM | POA: Diagnosis not present

## 2015-05-11 ENCOUNTER — Telehealth: Payer: Self-pay | Admitting: Internal Medicine

## 2015-05-11 MED ORDER — OMEPRAZOLE 20 MG PO CPDR: 20.0000 mg | DELAYED_RELEASE_CAPSULE | Freq: Every day | ORAL | Status: DC

## 2015-05-11 NOTE — Telephone Encounter (Signed)
Sent Rx for omeprazole (PRILOSEC), 20 mg, #40 with no refills to [facility] in [facility]. Pt must keep appointment with Dr. Olevia Perches on 06/20/15 at 3:45 pm in order to get refills on medication.

## 2015-05-12 DIAGNOSIS — L570 Actinic keratosis: Secondary | ICD-10-CM | POA: Diagnosis not present

## 2015-05-12 DIAGNOSIS — Z6830 Body mass index (BMI) 30.0-30.9, adult: Secondary | ICD-10-CM | POA: Diagnosis not present

## 2015-05-12 DIAGNOSIS — Z7901 Long term (current) use of anticoagulants: Secondary | ICD-10-CM | POA: Diagnosis not present

## 2015-05-12 DIAGNOSIS — W57XXXA Bitten or stung by nonvenomous insect and other nonvenomous arthropods, initial encounter: Secondary | ICD-10-CM | POA: Diagnosis not present

## 2015-05-16 DIAGNOSIS — I348 Other nonrheumatic mitral valve disorders: Secondary | ICD-10-CM | POA: Diagnosis not present

## 2015-05-16 DIAGNOSIS — Z7901 Long term (current) use of anticoagulants: Secondary | ICD-10-CM | POA: Diagnosis not present

## 2015-05-16 DIAGNOSIS — Z9889 Other specified postprocedural states: Secondary | ICD-10-CM | POA: Diagnosis not present

## 2015-06-20 ENCOUNTER — Encounter: Payer: Self-pay | Admitting: Internal Medicine

## 2015-06-20 ENCOUNTER — Ambulatory Visit (INDEPENDENT_AMBULATORY_CARE_PROVIDER_SITE_OTHER): Payer: Medicare Other | Admitting: Internal Medicine

## 2015-06-20 ENCOUNTER — Other Ambulatory Visit (INDEPENDENT_AMBULATORY_CARE_PROVIDER_SITE_OTHER): Payer: Medicare Other

## 2015-06-20 VITALS — BP 122/70 | HR 66 | Ht 65.5 in | Wt 195.0 lb

## 2015-06-20 DIAGNOSIS — K5520 Angiodysplasia of colon without hemorrhage: Secondary | ICD-10-CM

## 2015-06-20 DIAGNOSIS — D509 Iron deficiency anemia, unspecified: Secondary | ICD-10-CM

## 2015-06-20 LAB — CBC WITH DIFFERENTIAL/PLATELET
Basophils Absolute: 0 10*3/uL (ref 0.0–0.1)
Basophils Relative: 0.5 % (ref 0.0–3.0)
Eosinophils Absolute: 0.1 10*3/uL (ref 0.0–0.7)
Eosinophils Relative: 2 % (ref 0.0–5.0)
HCT: 40 % (ref 39.0–52.0)
Hemoglobin: 13.4 g/dL (ref 13.0–17.0)
Lymphocytes Relative: 23.4 % (ref 12.0–46.0)
Lymphs Abs: 1.5 10*3/uL (ref 0.7–4.0)
MCHC: 33.5 g/dL (ref 30.0–36.0)
MCV: 97.9 fl (ref 78.0–100.0)
Monocytes Absolute: 0.4 10*3/uL (ref 0.1–1.0)
Monocytes Relative: 7 % (ref 3.0–12.0)
Neutro Abs: 4.2 10*3/uL (ref 1.4–7.7)
Neutrophils Relative %: 67.1 % (ref 43.0–77.0)
Platelets: 221 10*3/uL (ref 150.0–400.0)
RBC: 4.08 Mil/uL — ABNORMAL LOW (ref 4.22–5.81)
RDW: 14.9 % (ref 11.5–15.5)
WBC: 6.3 10*3/uL (ref 4.0–10.5)

## 2015-06-20 MED ORDER — OMEPRAZOLE 20 MG PO CPDR: 20.0000 mg | DELAYED_RELEASE_CAPSULE | Freq: Every day | ORAL | Status: AC

## 2015-06-20 NOTE — Patient Instructions (Addendum)
Go to the basement for labs today We will send in Prilosec to your pharmacy You will receive information about your Cologuard Test.  Information given to you in office   Dr Leanna Battles

## 2015-06-20 NOTE — Progress Notes (Signed)
Steve Riddle. 04-Feb-1929 [id number]  Note: This dictation was prepared with Dragon digital system. Any transcriptional errors that result from this procedure are unintentional.   History of Present Illness: This  a 81 year old white male, presents for refill of omeprazole 20 mg a day which he takes for  GI bleed prophylaxis. He has  AVMs of the SB and colon on SBCE. in April 2015  endoscopy which confirmed duodenitis and several small AVMs in the small bowel as well as one AVM in the colon. He has been on lifelong Coumadin because of 2 prosthetic heart valves. We have been seeing him for colorectal screening since 1987 when he presented with 3 tubular adenomas. Subsequent colonoscopy in 1989 and 2002 again showed tubular adenomas and a hyperplastic polyp. Subsequent colonoscopy in February 2004 multiple adenomatous and hyperplastic polyps were removed from the colon. Last colonoscopy at [facility].- report not available. He denies any lower GI symptoms. Last hemoglobin in our office 11.3 hematocrit 33.1. Yearly physical exam by Dr. Philip Aspen in March 2015 showed  Hemoccult-negative stool. Upper abdominal ultrasound in April 2014 revealed  Cholelithiasis, one 2.5 cm solitary  and fatty liver as well as 2.9 cm abdominal aneurysm. He denies any dysphagia heartburn or odynophagia. He had an upper GI bleed in August 2013 but on endoscopy findings were no active bleeding. There was H. pylori negative duodenitis.    Past Medical History  Diagnosis Date  . Endocarditis   . Gout   . Hyperlipemia   . Hypertension   . Arthritis   . Heart murmur   . Hx of adenomatous colonic polyps   . Diverticulosis   . Hyperparathyroidism   . Nephrosclerosis   . Anemia   . GI bleed     Past Surgical History  Procedure Laterality Date  . Coronary artery bypass graft    . Mitral valve replacement    . Aortic and mitral valve replacement    . Cataract extraction Left     with lens implant  . Orif arm      Allergies   Allergen Reactions  . Bee Venom   . Ace Inhibitors Cough    Family history and social history have been reviewed.  Review of Systems: Denies heartburn abdominal pain change in bowel habits rectal bleeding  The remainder of the 10 point ROS is negative except as outlined in the H&P  Physical Exam: General Appearance Well developed, in no distress Eyes  Non icteric  HEENT  Non traumatic, normocephalic  Mouth No lesion, tongue papillated, no cheilosis Neck Supple without adenopathy, thyroid not enlarged, no carotid bruits, no JVD Lungs Clear to auscultation bilaterally COR Normal S1, normal S2, regular rhythm, no murmur, quiet precordium Abdomen soft nontender with normoactive bowel sounds. No tenderness. Liver edge at costal margin Rectal not done. Patient reports having stool checked for blocked by Dr. Sharlett Iles Extremities  No pedal edema Skin No lesions Neurological Alert and oriented x 3 Psychological Normal mood and affect  Assessment and Plan:   81 year old gentleman with the history of  major GI bleed while on Coumadin for 2 prosthetic heart valves. We will refill Prilosec 20 mg daily for prophylactic purposes.  History of AVMs demonstrated on small bowel capsule endoscopy  History of adenomatous colon polyps on multiple colonoscopies. Patient is not a candidate for colonoscopy due to age. For that reason I suggest Cologuard to look for  neoplastic DNA. Patient agrees with the plan we will be checking his CBC, iron levels, B12  today    Delfin Edis 06/20/2015

## 2015-06-21 LAB — IRON AND TIBC
%SAT: 23 % (ref 20–55)
Iron: 85 ug/dL (ref 42–165)
TIBC: 365 ug/dL (ref 215–435)
UIBC: 280 ug/dL (ref 125–400)

## 2015-06-21 LAB — VITAMIN B12: Vitamin B-12: 385 pg/mL (ref 211–911)

## 2015-06-27 DIAGNOSIS — I359 Nonrheumatic aortic valve disorder, unspecified: Secondary | ICD-10-CM | POA: Diagnosis not present

## 2015-06-27 DIAGNOSIS — Z7901 Long term (current) use of anticoagulants: Secondary | ICD-10-CM | POA: Diagnosis not present

## 2015-07-03 DIAGNOSIS — N183 Chronic kidney disease, stage 3 (moderate): Secondary | ICD-10-CM | POA: Diagnosis not present

## 2015-07-03 DIAGNOSIS — N2581 Secondary hyperparathyroidism of renal origin: Secondary | ICD-10-CM | POA: Diagnosis not present

## 2015-07-03 DIAGNOSIS — D631 Anemia in chronic kidney disease: Secondary | ICD-10-CM | POA: Diagnosis not present

## 2015-07-03 DIAGNOSIS — I129 Hypertensive chronic kidney disease with stage 1 through stage 4 chronic kidney disease, or unspecified chronic kidney disease: Secondary | ICD-10-CM | POA: Diagnosis not present

## 2015-07-04 DIAGNOSIS — Z1211 Encounter for screening for malignant neoplasm of colon: Secondary | ICD-10-CM | POA: Diagnosis not present

## 2015-07-04 DIAGNOSIS — Z1212 Encounter for screening for malignant neoplasm of rectum: Secondary | ICD-10-CM | POA: Diagnosis not present

## 2015-07-07 DIAGNOSIS — L570 Actinic keratosis: Secondary | ICD-10-CM | POA: Diagnosis not present

## 2015-07-07 DIAGNOSIS — L821 Other seborrheic keratosis: Secondary | ICD-10-CM | POA: Diagnosis not present

## 2015-07-11 DIAGNOSIS — Z961 Presence of intraocular lens: Secondary | ICD-10-CM | POA: Diagnosis not present

## 2015-07-11 DIAGNOSIS — E119 Type 2 diabetes mellitus without complications: Secondary | ICD-10-CM | POA: Diagnosis not present

## 2015-07-11 DIAGNOSIS — H2511 Age-related nuclear cataract, right eye: Secondary | ICD-10-CM | POA: Diagnosis not present

## 2015-07-11 DIAGNOSIS — H35033 Hypertensive retinopathy, bilateral: Secondary | ICD-10-CM | POA: Diagnosis not present

## 2015-07-12 LAB — COLOGUARD

## 2015-07-19 DIAGNOSIS — I10 Essential (primary) hypertension: Secondary | ICD-10-CM | POA: Diagnosis not present

## 2015-07-19 DIAGNOSIS — E780 Pure hypercholesterolemia: Secondary | ICD-10-CM | POA: Diagnosis not present

## 2015-07-19 DIAGNOSIS — Z8679 Personal history of other diseases of the circulatory system: Secondary | ICD-10-CM | POA: Diagnosis not present

## 2015-07-19 DIAGNOSIS — I2510 Atherosclerotic heart disease of native coronary artery without angina pectoris: Secondary | ICD-10-CM | POA: Diagnosis not present

## 2015-07-31 ENCOUNTER — Telehealth: Payer: Self-pay | Admitting: *Deleted

## 2015-07-31 NOTE — Telephone Encounter (Signed)
-----   Message from Lafayette Dragon, MD sent at 07/31/2015 10:17 AM EDT ----- Regarding: cologuard Please call pt with positive cologuard. He will have to see Dr Havery Moros to discuss whether he needs a colonoscopy.

## 2015-07-31 NOTE — Telephone Encounter (Signed)
Spoke with patient and gave results. Scheduled OV with Dr. Havery Moros.

## 2015-08-02 DIAGNOSIS — I10 Essential (primary) hypertension: Secondary | ICD-10-CM | POA: Diagnosis not present

## 2015-08-02 DIAGNOSIS — Z7901 Long term (current) use of anticoagulants: Secondary | ICD-10-CM | POA: Diagnosis not present

## 2015-08-02 DIAGNOSIS — Z9889 Other specified postprocedural states: Secondary | ICD-10-CM | POA: Diagnosis not present

## 2015-08-02 DIAGNOSIS — R0602 Shortness of breath: Secondary | ICD-10-CM | POA: Diagnosis not present

## 2015-08-02 DIAGNOSIS — R42 Dizziness and giddiness: Secondary | ICD-10-CM | POA: Diagnosis not present

## 2015-08-02 DIAGNOSIS — I491 Atrial premature depolarization: Secondary | ICD-10-CM | POA: Diagnosis not present

## 2015-08-02 DIAGNOSIS — Z6830 Body mass index (BMI) 30.0-30.9, adult: Secondary | ICD-10-CM | POA: Diagnosis not present

## 2015-08-03 DIAGNOSIS — R002 Palpitations: Secondary | ICD-10-CM | POA: Diagnosis not present

## 2015-08-03 DIAGNOSIS — R001 Bradycardia, unspecified: Secondary | ICD-10-CM | POA: Diagnosis not present

## 2015-08-03 DIAGNOSIS — I10 Essential (primary) hypertension: Secondary | ICD-10-CM | POA: Diagnosis not present

## 2015-08-03 DIAGNOSIS — R0602 Shortness of breath: Secondary | ICD-10-CM | POA: Diagnosis not present

## 2015-08-07 DIAGNOSIS — I10 Essential (primary) hypertension: Secondary | ICD-10-CM | POA: Diagnosis not present

## 2015-08-07 DIAGNOSIS — I480 Paroxysmal atrial fibrillation: Secondary | ICD-10-CM | POA: Diagnosis not present

## 2015-08-07 DIAGNOSIS — R0602 Shortness of breath: Secondary | ICD-10-CM | POA: Diagnosis not present

## 2015-08-07 DIAGNOSIS — Z952 Presence of prosthetic heart valve: Secondary | ICD-10-CM | POA: Diagnosis not present

## 2015-08-08 DIAGNOSIS — R002 Palpitations: Secondary | ICD-10-CM | POA: Diagnosis not present

## 2015-09-12 DIAGNOSIS — Z23 Encounter for immunization: Secondary | ICD-10-CM | POA: Diagnosis not present

## 2015-09-12 DIAGNOSIS — I348 Other nonrheumatic mitral valve disorders: Secondary | ICD-10-CM | POA: Diagnosis not present

## 2015-09-12 DIAGNOSIS — Z7901 Long term (current) use of anticoagulants: Secondary | ICD-10-CM | POA: Diagnosis not present

## 2015-09-26 ENCOUNTER — Encounter: Payer: Self-pay | Admitting: Gastroenterology

## 2015-09-26 ENCOUNTER — Other Ambulatory Visit (INDEPENDENT_AMBULATORY_CARE_PROVIDER_SITE_OTHER): Payer: Medicare Other

## 2015-09-26 ENCOUNTER — Ambulatory Visit (INDEPENDENT_AMBULATORY_CARE_PROVIDER_SITE_OTHER): Payer: Medicare Other | Admitting: Gastroenterology

## 2015-09-26 VITALS — BP 122/58 | HR 56 | Ht 60.25 in | Wt 194.1 lb

## 2015-09-26 DIAGNOSIS — R195 Other fecal abnormalities: Secondary | ICD-10-CM

## 2015-09-26 DIAGNOSIS — Z7901 Long term (current) use of anticoagulants: Secondary | ICD-10-CM | POA: Diagnosis not present

## 2015-09-26 LAB — CBC WITH DIFFERENTIAL/PLATELET
Basophils Absolute: 0 10*3/uL (ref 0.0–0.1)
Basophils Relative: 0.3 % (ref 0.0–3.0)
Eosinophils Absolute: 0.1 10*3/uL (ref 0.0–0.7)
Eosinophils Relative: 2.1 % (ref 0.0–5.0)
HCT: 39.7 % (ref 39.0–52.0)
Hemoglobin: 13.4 g/dL (ref 13.0–17.0)
Lymphocytes Relative: 20.4 % (ref 12.0–46.0)
Lymphs Abs: 1.4 10*3/uL (ref 0.7–4.0)
MCHC: 33.8 g/dL (ref 30.0–36.0)
MCV: 98.4 fl (ref 78.0–100.0)
Monocytes Absolute: 0.5 10*3/uL (ref 0.1–1.0)
Monocytes Relative: 7.3 % (ref 3.0–12.0)
Neutro Abs: 4.7 10*3/uL (ref 1.4–7.7)
Neutrophils Relative %: 69.9 % (ref 43.0–77.0)
Platelets: 227 10*3/uL (ref 150.0–400.0)
RBC: 4.03 Mil/uL — ABNORMAL LOW (ref 4.22–5.81)
RDW: 15.5 % (ref 11.5–15.5)
WBC: 6.7 10*3/uL (ref 4.0–10.5)

## 2015-09-26 NOTE — Progress Notes (Signed)
HPI :  81 y/o male here for follow up regarding positive Cologuard testing, ordered by Dr. Olevia Perches a few months ago. He denies any overt blood in the stools.  2 BMs per day. No constipation or diarrhea. No abdominal pains. Weight is stable over the past 3 years. Eating well. No vomiting, doing well in this regard. He reports he had a colonoscopy done in 2008 at [facility] - he reports he had some polyps noted but were not removed. He takes coumadin for 2 prosthetic heart valves. He required admission for prior colonoscopy given his anticoagulation needs. He denies any family history of colon cancer, only colon polyps. No aspirin. He takes tylenol for pains. He also takes prilosec for prophylaxis since EGD showed duodenitis. He follows with Dr. Wadie Lessen at [facility] for cardiology. He reports is goal INR is 2.5 to 3.5. He has INR checked every 6 weeks. He has had a history of prior GI bleeding and anemia with supratherapeutic INR, thought to be due to small bowel AVMs in the setting of coumadin. Recent Hgb has been stable without anemia. He overall feels well.    Prior endoscopic evaluation in our records: Capsule endoscopy 4/15 - small bowel AVMs, duodenitis EGD 8/13 - mild duodenitis Colonoscopy 01/2003 - 3 polyps removed   Past Medical History  Diagnosis Date  . Endocarditis   . Gout   . Hyperlipemia   . Hypertension   . Arthritis   . Heart murmur   . Hx of adenomatous colonic polyps   . Diverticulosis   . Hyperparathyroidism ([place])   . Nephrosclerosis   . Anemia   . GI bleed      Past Surgical History  Procedure Laterality Date  . Coronary artery bypass graft    . Mitral valve replacement    . Aortic and mitral valve replacement    . Cataract extraction Left     with lens implant  . Orif arm     Family History  Problem Relation Age of Onset  . Hypertension Mother   . Stroke Mother   . Hypertension Father   . Breast cancer Daughter   . Uterine cancer Daughter   . Liver cancer Brother  32   Social History  Substance Use Topics  . Smoking status: Never Smoker   . Smokeless tobacco: Never Used  . Alcohol Use: No   Current Outpatient Prescriptions  Medication Sig Dispense Refill  . acetaminophen (TYLENOL) 650 MG CR tablet Take 650 mg by mouth every 8 (eight) hours as needed.    Marland Kitchen allopurinol (ZYLOPRIM) 100 MG tablet Take 400 mg by mouth daily.    . bumetanide (BUMEX) 2 MG tablet Take 2 tablets by mouth in the morning and 1 tablet in the evening    . calcitRIOL (ROCALTROL) 0.5 MCG capsule Take one tablet by mouth daily    . cetirizine (ZYRTEC) 10 MG tablet Take 10 mg by mouth daily.    . diphenhydrAMINE (BENADRYL) 25 MG tablet Take 25 mg by mouth at bedtime as needed. 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 EPINEPHrine (EPIPEN JR) 0.15 MG/0.3ML injection Inject 0.15 mg into the muscle as needed.    . fish oil-omega-3 fatty acids 1000 MG capsule Take 1 g by mouth daily.    Marland Kitchen gemfibrozil (LOPID) 600 MG tablet Take 600 mg by mouth 2 (two) times daily.    Marland Kitchen glucosamine-chondroitin 500-400 MG tablet Take 1 tablet by mouth daily.     . metoprolol tartrate (LOPRESSOR)  25 MG tablet Take 25 mg by mouth 2 (two) times daily.     . Multiple Vitamins-Minerals (MULTIVITAMIN WITH MINERALS) tablet Take 1 tablet by mouth daily.    Marland Kitchen omeprazole (PRILOSEC) 20 MG capsule Take 1 capsule (20 mg total) by mouth daily. 30 capsule 11  . potassium chloride SA (K-DUR,KLOR-CON) 20 MEQ tablet Take 20 mEq by mouth 2 (two) times daily.    . pravastatin (PRAVACHOL) 40 MG tablet Take 80 mg by mouth daily.     . tamsulosin (FLOMAX) 0.4 MG CAPS capsule Take 0.4 mg by mouth daily.    Marland Kitchen warfarin (COUMADIN) 5 MG tablet Take 5 mg by mouth daily.      No current facility-administered medications for this visit.   Allergies  Allergen Reactions  . Bee Venom   . Ace Inhibitors Cough     Review of Systems: All systems reviewed and negative except where noted in HPI.   07/04/15 - positive cologuard 06/20/15 - Hgb 13s,  normal iron stores  Physical Exam: BP 122/58 mmHg  Pulse 56  Ht 5' 0.25" (1.53 m)  Wt 194 lb 2 oz (88.055 kg)  BMI 37.62 kg/m2 Constitutional: Pleasant,well-developed, male in no acute distress. HEENT: Normocephalic and atraumatic. Conjunctivae are normal. No scleral icterus. Neck supple.  Cardiovascular: mechanical heart sounds. Normal rate, regular rhythm.  Pulmonary/chest: Effort normal and breath sounds normal. No wheezing, rales or rhonchi. Abdominal: Soft, nondistended, nontender. Bowel sounds active throughout. There are no masses palpable. No hepatomegaly. Extremities: no edema Lymphadenopathy: No cervical adenopathy noted. Neurological: Alert and oriented to person place and time. Skin: Skin is warm and dry. No rashes noted. Psychiatric: Normal mood and affect. Behavior is normal.   ASSESSMENT AND PLAN: 81 y/o male with history of GI bleeding and anemia thought due to small bowel AVMs in the setting of chronic coumadin use for 2 prosthetic heart valves, here to discuss positive Cologuard testing, ordered by Dr. Olevia Perches during their last visit. He has had prior colonoscopy at our facility in 2004 with a few adenomas removed, and reports a prior colonoscopy while at [facility] in 2008 in which he was hospitalized in light of his anticoagulation, and polyps were noted but not removed at the time due to anticoagulated status (per patient report).   I discussed Cologuard testing with him and what a positive test indicates. I suspect in his case this test is positive from adenomatous polyps noted on last colonoscopy in 2008, however unclear size of polyps noted in 2008 and likelihood of malignant transformation, so we will need to obtain this report from [facility]. Otherwise he is asymptomatic at this time, and last CBC showed normal Hgb but will recheck to ensure stable. Moving forward, for him to go through a colonoscopy requires close coordination with cardiology regarding his anticoagulation  management given he is high risk for thrombosis of his valve with a subtherapeutic INR and would require a bridge for his anticoagulation if polypectomy is performed. In order to risk stratify his likelihood for malignancy at this time, a virtual colonoscopy may be a reasonable approach. If the exam shows no large polyps or mass lesions then he would opt to not have further screening exams if the risk of malignancy in his lifetime is low. On the other hand, if he had a large polyp or mass lesion he reports would want to know this and consider removal. His preference is to avoid a colonoscopy if at all possible which agree with given the anticoagulation issue.  I will await his colonoscopy report from [facility] to see if he had any high risk lesions on that exam and will check CBC. I will contact him when I get this report to see how he would like to proceed. All questions answered.   Greenbelt Cellar, MD [facility]

## 2015-09-26 NOTE — Patient Instructions (Signed)
Your physician has requested that you go to the basement for the following lab work before leaving today: CBC  Thank you for choosing [facility] to care for you.

## 2015-10-18 ENCOUNTER — Telehealth: Payer: Self-pay | Admitting: *Deleted

## 2015-10-18 NOTE — Telephone Encounter (Signed)
-----   Message from Hulan Saas, RN sent at 10/03/2015  2:08 PM EDT ----- Call patient to see if he has seen cardiology and decided on virtual colon/ SA

## 2015-10-18 NOTE — Telephone Encounter (Signed)
Spoke with patient and he has not seen his cardiology yet. He will let us know but he is reluctant to have virtual colonoscopy because he does not want surgery if it shows something.

## 2015-10-18 NOTE — Telephone Encounter (Signed)
Thanks for the follow up. I really think the virtual colonoscopy is a good idea to at least have more information to make a decision. He should follow up with his cardiologist as soon as he can so we can make a decision on how to proceed. Thanks for checking with him

## 2015-10-24 DIAGNOSIS — I330 Acute and subacute infective endocarditis: Secondary | ICD-10-CM | POA: Diagnosis not present

## 2015-10-24 DIAGNOSIS — Z8719 Personal history of other diseases of the digestive system: Secondary | ICD-10-CM | POA: Diagnosis not present

## 2015-10-24 DIAGNOSIS — N2581 Secondary hyperparathyroidism of renal origin: Secondary | ICD-10-CM | POA: Diagnosis not present

## 2015-10-24 DIAGNOSIS — Z7901 Long term (current) use of anticoagulants: Secondary | ICD-10-CM | POA: Diagnosis not present

## 2015-10-24 DIAGNOSIS — I348 Other nonrheumatic mitral valve disorders: Secondary | ICD-10-CM | POA: Diagnosis not present

## 2015-10-24 DIAGNOSIS — E782 Mixed hyperlipidemia: Secondary | ICD-10-CM | POA: Diagnosis not present

## 2015-10-24 DIAGNOSIS — I129 Hypertensive chronic kidney disease with stage 1 through stage 4 chronic kidney disease, or unspecified chronic kidney disease: Secondary | ICD-10-CM | POA: Diagnosis not present

## 2015-10-24 DIAGNOSIS — M10379 Gout due to renal impairment, unspecified ankle and foot: Secondary | ICD-10-CM | POA: Diagnosis not present

## 2015-10-24 DIAGNOSIS — I429 Cardiomyopathy, unspecified: Secondary | ICD-10-CM | POA: Diagnosis not present

## 2015-10-24 DIAGNOSIS — N183 Chronic kidney disease, stage 3 (moderate): Secondary | ICD-10-CM | POA: Diagnosis not present

## 2015-10-24 DIAGNOSIS — E669 Obesity, unspecified: Secondary | ICD-10-CM | POA: Diagnosis not present

## 2015-10-24 DIAGNOSIS — D631 Anemia in chronic kidney disease: Secondary | ICD-10-CM | POA: Diagnosis not present

## 2015-11-14 DIAGNOSIS — N183 Chronic kidney disease, stage 3 (moderate): Secondary | ICD-10-CM | POA: Diagnosis not present

## 2015-11-14 DIAGNOSIS — N2581 Secondary hyperparathyroidism of renal origin: Secondary | ICD-10-CM | POA: Diagnosis not present

## 2015-12-05 DIAGNOSIS — Z7901 Long term (current) use of anticoagulants: Secondary | ICD-10-CM | POA: Diagnosis not present

## 2015-12-05 DIAGNOSIS — I348 Other nonrheumatic mitral valve disorders: Secondary | ICD-10-CM | POA: Diagnosis not present

## 2015-12-05 DIAGNOSIS — I359 Nonrheumatic aortic valve disorder, unspecified: Secondary | ICD-10-CM | POA: Diagnosis not present

## 2016-01-09 DIAGNOSIS — Z7901 Long term (current) use of anticoagulants: Secondary | ICD-10-CM | POA: Diagnosis not present

## 2016-01-09 DIAGNOSIS — I348 Other nonrheumatic mitral valve disorders: Secondary | ICD-10-CM | POA: Diagnosis not present

## 2016-01-26 DIAGNOSIS — I10 Essential (primary) hypertension: Secondary | ICD-10-CM | POA: Diagnosis not present

## 2016-01-26 DIAGNOSIS — R0602 Shortness of breath: Secondary | ICD-10-CM | POA: Diagnosis not present

## 2016-01-26 DIAGNOSIS — R05 Cough: Secondary | ICD-10-CM | POA: Diagnosis not present

## 2016-01-26 DIAGNOSIS — Z6830 Body mass index (BMI) 30.0-30.9, adult: Secondary | ICD-10-CM | POA: Diagnosis not present

## 2016-02-07 DIAGNOSIS — I10 Essential (primary) hypertension: Secondary | ICD-10-CM | POA: Diagnosis not present

## 2016-02-07 DIAGNOSIS — E7800 Pure hypercholesterolemia, unspecified: Secondary | ICD-10-CM | POA: Diagnosis not present

## 2016-02-07 DIAGNOSIS — I2510 Atherosclerotic heart disease of native coronary artery without angina pectoris: Secondary | ICD-10-CM | POA: Diagnosis not present

## 2016-02-19 DIAGNOSIS — E784 Other hyperlipidemia: Secondary | ICD-10-CM | POA: Diagnosis not present

## 2016-02-19 DIAGNOSIS — R7301 Impaired fasting glucose: Secondary | ICD-10-CM | POA: Diagnosis not present

## 2016-02-19 DIAGNOSIS — Z125 Encounter for screening for malignant neoplasm of prostate: Secondary | ICD-10-CM | POA: Diagnosis not present

## 2016-02-19 DIAGNOSIS — N183 Chronic kidney disease, stage 3 (moderate): Secondary | ICD-10-CM | POA: Diagnosis not present

## 2016-02-19 DIAGNOSIS — M109 Gout, unspecified: Secondary | ICD-10-CM | POA: Diagnosis not present

## 2016-02-26 DIAGNOSIS — Z1389 Encounter for screening for other disorder: Secondary | ICD-10-CM | POA: Diagnosis not present

## 2016-02-26 DIAGNOSIS — R7301 Impaired fasting glucose: Secondary | ICD-10-CM | POA: Diagnosis not present

## 2016-02-26 DIAGNOSIS — M109 Gout, unspecified: Secondary | ICD-10-CM | POA: Diagnosis not present

## 2016-02-26 DIAGNOSIS — Z9889 Other specified postprocedural states: Secondary | ICD-10-CM | POA: Diagnosis not present

## 2016-02-26 DIAGNOSIS — Z7901 Long term (current) use of anticoagulants: Secondary | ICD-10-CM | POA: Diagnosis not present

## 2016-02-26 DIAGNOSIS — R972 Elevated prostate specific antigen [PSA]: Secondary | ICD-10-CM | POA: Diagnosis not present

## 2016-02-26 DIAGNOSIS — Z0000 Encounter for general adult medical examination without abnormal findings: Secondary | ICD-10-CM | POA: Diagnosis not present

## 2016-02-26 DIAGNOSIS — C44310 Basal cell carcinoma of skin of unspecified parts of face: Secondary | ICD-10-CM | POA: Diagnosis not present

## 2016-02-26 DIAGNOSIS — I10 Essential (primary) hypertension: Secondary | ICD-10-CM | POA: Diagnosis not present

## 2016-02-26 DIAGNOSIS — Z6832 Body mass index (BMI) 32.0-32.9, adult: Secondary | ICD-10-CM | POA: Diagnosis not present

## 2016-02-26 DIAGNOSIS — E784 Other hyperlipidemia: Secondary | ICD-10-CM | POA: Diagnosis not present

## 2016-03-01 DIAGNOSIS — I429 Cardiomyopathy, unspecified: Secondary | ICD-10-CM | POA: Diagnosis not present

## 2016-03-01 DIAGNOSIS — E782 Mixed hyperlipidemia: Secondary | ICD-10-CM | POA: Diagnosis not present

## 2016-03-01 DIAGNOSIS — M10379 Gout due to renal impairment, unspecified ankle and foot: Secondary | ICD-10-CM | POA: Diagnosis not present

## 2016-03-01 DIAGNOSIS — D631 Anemia in chronic kidney disease: Secondary | ICD-10-CM | POA: Diagnosis not present

## 2016-03-01 DIAGNOSIS — I330 Acute and subacute infective endocarditis: Secondary | ICD-10-CM | POA: Diagnosis not present

## 2016-03-01 DIAGNOSIS — Z8719 Personal history of other diseases of the digestive system: Secondary | ICD-10-CM | POA: Diagnosis not present

## 2016-03-01 DIAGNOSIS — N183 Chronic kidney disease, stage 3 (moderate): Secondary | ICD-10-CM | POA: Diagnosis not present

## 2016-03-01 DIAGNOSIS — E669 Obesity, unspecified: Secondary | ICD-10-CM | POA: Diagnosis not present

## 2016-03-01 DIAGNOSIS — N2581 Secondary hyperparathyroidism of renal origin: Secondary | ICD-10-CM | POA: Diagnosis not present

## 2016-03-01 DIAGNOSIS — I129 Hypertensive chronic kidney disease with stage 1 through stage 4 chronic kidney disease, or unspecified chronic kidney disease: Secondary | ICD-10-CM | POA: Diagnosis not present

## 2016-03-04 DIAGNOSIS — D485 Neoplasm of uncertain behavior of skin: Secondary | ICD-10-CM | POA: Diagnosis not present

## 2016-03-04 DIAGNOSIS — C44329 Squamous cell carcinoma of skin of other parts of face: Secondary | ICD-10-CM | POA: Diagnosis not present

## 2016-03-22 DIAGNOSIS — R972 Elevated prostate specific antigen [PSA]: Secondary | ICD-10-CM | POA: Diagnosis not present

## 2016-03-22 DIAGNOSIS — Z0000 Encounter for general adult medical examination without abnormal findings: Secondary | ICD-10-CM | POA: Diagnosis not present

## 2016-04-02 DIAGNOSIS — Z85828 Personal history of other malignant neoplasm of skin: Secondary | ICD-10-CM | POA: Diagnosis not present

## 2016-04-02 DIAGNOSIS — C44329 Squamous cell carcinoma of skin of other parts of face: Secondary | ICD-10-CM | POA: Diagnosis not present

## 2016-04-16 DIAGNOSIS — Z7901 Long term (current) use of anticoagulants: Secondary | ICD-10-CM | POA: Diagnosis not present

## 2016-04-16 DIAGNOSIS — I359 Nonrheumatic aortic valve disorder, unspecified: Secondary | ICD-10-CM | POA: Diagnosis not present

## 2016-04-16 DIAGNOSIS — K146 Glossodynia: Secondary | ICD-10-CM | POA: Diagnosis not present

## 2016-05-27 DIAGNOSIS — I491 Atrial premature depolarization: Secondary | ICD-10-CM | POA: Diagnosis not present

## 2016-05-27 DIAGNOSIS — I359 Nonrheumatic aortic valve disorder, unspecified: Secondary | ICD-10-CM | POA: Diagnosis not present

## 2016-05-27 DIAGNOSIS — Z7901 Long term (current) use of anticoagulants: Secondary | ICD-10-CM | POA: Diagnosis not present

## 2016-07-08 DIAGNOSIS — I348 Other nonrheumatic mitral valve disorders: Secondary | ICD-10-CM | POA: Diagnosis not present

## 2016-07-08 DIAGNOSIS — I359 Nonrheumatic aortic valve disorder, unspecified: Secondary | ICD-10-CM | POA: Diagnosis not present

## 2016-07-08 DIAGNOSIS — Z7901 Long term (current) use of anticoagulants: Secondary | ICD-10-CM | POA: Diagnosis not present

## 2016-07-15 DIAGNOSIS — E119 Type 2 diabetes mellitus without complications: Secondary | ICD-10-CM | POA: Diagnosis not present

## 2016-07-15 DIAGNOSIS — Z961 Presence of intraocular lens: Secondary | ICD-10-CM | POA: Diagnosis not present

## 2016-08-07 DIAGNOSIS — E7800 Pure hypercholesterolemia, unspecified: Secondary | ICD-10-CM | POA: Diagnosis not present

## 2016-08-07 DIAGNOSIS — I2510 Atherosclerotic heart disease of native coronary artery without angina pectoris: Secondary | ICD-10-CM | POA: Diagnosis not present

## 2016-08-07 DIAGNOSIS — Z8679 Personal history of other diseases of the circulatory system: Secondary | ICD-10-CM | POA: Diagnosis not present

## 2016-08-19 DIAGNOSIS — I348 Other nonrheumatic mitral valve disorders: Secondary | ICD-10-CM | POA: Diagnosis not present

## 2016-08-19 DIAGNOSIS — Z7901 Long term (current) use of anticoagulants: Secondary | ICD-10-CM | POA: Diagnosis not present

## 2016-08-19 DIAGNOSIS — Z23 Encounter for immunization: Secondary | ICD-10-CM | POA: Diagnosis not present

## 2016-09-05 DIAGNOSIS — N2581 Secondary hyperparathyroidism of renal origin: Secondary | ICD-10-CM | POA: Diagnosis not present

## 2016-09-05 DIAGNOSIS — N183 Chronic kidney disease, stage 3 (moderate): Secondary | ICD-10-CM | POA: Diagnosis not present

## 2016-09-05 DIAGNOSIS — Z8719 Personal history of other diseases of the digestive system: Secondary | ICD-10-CM | POA: Diagnosis not present

## 2016-09-05 DIAGNOSIS — I429 Cardiomyopathy, unspecified: Secondary | ICD-10-CM | POA: Diagnosis not present

## 2016-09-05 DIAGNOSIS — I129 Hypertensive chronic kidney disease with stage 1 through stage 4 chronic kidney disease, or unspecified chronic kidney disease: Secondary | ICD-10-CM | POA: Diagnosis not present

## 2016-09-05 DIAGNOSIS — E782 Mixed hyperlipidemia: Secondary | ICD-10-CM | POA: Diagnosis not present

## 2016-09-05 DIAGNOSIS — I330 Acute and subacute infective endocarditis: Secondary | ICD-10-CM | POA: Diagnosis not present

## 2016-09-05 DIAGNOSIS — E669 Obesity, unspecified: Secondary | ICD-10-CM | POA: Diagnosis not present

## 2016-09-05 DIAGNOSIS — D631 Anemia in chronic kidney disease: Secondary | ICD-10-CM | POA: Diagnosis not present

## 2016-09-05 DIAGNOSIS — M109 Gout, unspecified: Secondary | ICD-10-CM | POA: Diagnosis not present

## 2016-09-30 DIAGNOSIS — I348 Other nonrheumatic mitral valve disorders: Secondary | ICD-10-CM | POA: Diagnosis not present

## 2016-09-30 DIAGNOSIS — Z7901 Long term (current) use of anticoagulants: Secondary | ICD-10-CM | POA: Diagnosis not present

## 2016-11-12 DIAGNOSIS — I348 Other nonrheumatic mitral valve disorders: Secondary | ICD-10-CM | POA: Diagnosis not present

## 2016-11-12 DIAGNOSIS — Z7901 Long term (current) use of anticoagulants: Secondary | ICD-10-CM | POA: Diagnosis not present

## 2016-12-26 DIAGNOSIS — I491 Atrial premature depolarization: Secondary | ICD-10-CM | POA: Diagnosis not present

## 2016-12-26 DIAGNOSIS — Z7901 Long term (current) use of anticoagulants: Secondary | ICD-10-CM | POA: Diagnosis not present

## 2017-02-06 DIAGNOSIS — Z7901 Long term (current) use of anticoagulants: Secondary | ICD-10-CM | POA: Diagnosis not present

## 2017-02-06 DIAGNOSIS — I348 Other nonrheumatic mitral valve disorders: Secondary | ICD-10-CM | POA: Diagnosis not present

## 2017-02-12 DIAGNOSIS — I10 Essential (primary) hypertension: Secondary | ICD-10-CM | POA: Diagnosis not present

## 2017-02-12 DIAGNOSIS — R002 Palpitations: Secondary | ICD-10-CM | POA: Diagnosis not present

## 2017-02-12 DIAGNOSIS — Z8679 Personal history of other diseases of the circulatory system: Secondary | ICD-10-CM | POA: Diagnosis not present

## 2017-02-12 DIAGNOSIS — E7800 Pure hypercholesterolemia, unspecified: Secondary | ICD-10-CM | POA: Diagnosis not present

## 2017-02-25 DIAGNOSIS — I129 Hypertensive chronic kidney disease with stage 1 through stage 4 chronic kidney disease, or unspecified chronic kidney disease: Secondary | ICD-10-CM | POA: Diagnosis not present

## 2017-02-25 DIAGNOSIS — Z5181 Encounter for therapeutic drug level monitoring: Secondary | ICD-10-CM | POA: Diagnosis not present

## 2017-02-25 DIAGNOSIS — I429 Cardiomyopathy, unspecified: Secondary | ICD-10-CM | POA: Diagnosis not present

## 2017-02-25 DIAGNOSIS — E669 Obesity, unspecified: Secondary | ICD-10-CM | POA: Diagnosis not present

## 2017-02-25 DIAGNOSIS — E782 Mixed hyperlipidemia: Secondary | ICD-10-CM | POA: Diagnosis not present

## 2017-02-25 DIAGNOSIS — D631 Anemia in chronic kidney disease: Secondary | ICD-10-CM | POA: Diagnosis not present

## 2017-02-25 DIAGNOSIS — I330 Acute and subacute infective endocarditis: Secondary | ICD-10-CM | POA: Diagnosis not present

## 2017-02-25 DIAGNOSIS — M109 Gout, unspecified: Secondary | ICD-10-CM | POA: Diagnosis not present

## 2017-02-25 DIAGNOSIS — Z7901 Long term (current) use of anticoagulants: Secondary | ICD-10-CM | POA: Diagnosis not present

## 2017-02-25 DIAGNOSIS — N183 Chronic kidney disease, stage 3 (moderate): Secondary | ICD-10-CM | POA: Diagnosis not present

## 2017-02-25 DIAGNOSIS — Z8719 Personal history of other diseases of the digestive system: Secondary | ICD-10-CM | POA: Diagnosis not present

## 2017-02-25 DIAGNOSIS — N2581 Secondary hyperparathyroidism of renal origin: Secondary | ICD-10-CM | POA: Diagnosis not present

## 2017-02-26 DIAGNOSIS — R972 Elevated prostate specific antigen [PSA]: Secondary | ICD-10-CM | POA: Diagnosis not present

## 2017-02-27 DIAGNOSIS — M109 Gout, unspecified: Secondary | ICD-10-CM | POA: Diagnosis not present

## 2017-02-27 DIAGNOSIS — N183 Chronic kidney disease, stage 3 (moderate): Secondary | ICD-10-CM | POA: Diagnosis not present

## 2017-02-27 DIAGNOSIS — E784 Other hyperlipidemia: Secondary | ICD-10-CM | POA: Diagnosis not present

## 2017-02-27 DIAGNOSIS — R7301 Impaired fasting glucose: Secondary | ICD-10-CM | POA: Diagnosis not present

## 2017-02-27 DIAGNOSIS — Z125 Encounter for screening for malignant neoplasm of prostate: Secondary | ICD-10-CM | POA: Diagnosis not present

## 2017-03-06 DIAGNOSIS — Z0000 Encounter for general adult medical examination without abnormal findings: Secondary | ICD-10-CM | POA: Diagnosis not present

## 2017-03-06 DIAGNOSIS — M109 Gout, unspecified: Secondary | ICD-10-CM | POA: Diagnosis not present

## 2017-03-06 DIAGNOSIS — I129 Hypertensive chronic kidney disease with stage 1 through stage 4 chronic kidney disease, or unspecified chronic kidney disease: Secondary | ICD-10-CM | POA: Diagnosis not present

## 2017-03-06 DIAGNOSIS — Z6832 Body mass index (BMI) 32.0-32.9, adult: Secondary | ICD-10-CM | POA: Diagnosis not present

## 2017-03-06 DIAGNOSIS — E784 Other hyperlipidemia: Secondary | ICD-10-CM | POA: Diagnosis not present

## 2017-03-06 DIAGNOSIS — N183 Chronic kidney disease, stage 3 (moderate): Secondary | ICD-10-CM | POA: Diagnosis not present

## 2017-03-06 DIAGNOSIS — Z1389 Encounter for screening for other disorder: Secondary | ICD-10-CM | POA: Diagnosis not present

## 2017-03-06 DIAGNOSIS — R7301 Impaired fasting glucose: Secondary | ICD-10-CM | POA: Diagnosis not present

## 2017-03-06 DIAGNOSIS — R972 Elevated prostate specific antigen [PSA]: Secondary | ICD-10-CM | POA: Diagnosis not present

## 2017-03-06 DIAGNOSIS — I10 Essential (primary) hypertension: Secondary | ICD-10-CM | POA: Diagnosis not present

## 2017-04-17 DIAGNOSIS — Z7901 Long term (current) use of anticoagulants: Secondary | ICD-10-CM | POA: Diagnosis not present

## 2017-04-17 DIAGNOSIS — I348 Other nonrheumatic mitral valve disorders: Secondary | ICD-10-CM | POA: Diagnosis not present

## 2017-05-28 DIAGNOSIS — I348 Other nonrheumatic mitral valve disorders: Secondary | ICD-10-CM | POA: Diagnosis not present

## 2017-05-28 DIAGNOSIS — Z7901 Long term (current) use of anticoagulants: Secondary | ICD-10-CM | POA: Diagnosis not present

## 2017-05-28 DIAGNOSIS — I359 Nonrheumatic aortic valve disorder, unspecified: Secondary | ICD-10-CM | POA: Diagnosis not present

## 2017-07-10 DIAGNOSIS — Z7901 Long term (current) use of anticoagulants: Secondary | ICD-10-CM | POA: Diagnosis not present

## 2017-07-10 DIAGNOSIS — I348 Other nonrheumatic mitral valve disorders: Secondary | ICD-10-CM | POA: Diagnosis not present

## 2017-07-16 DIAGNOSIS — Z961 Presence of intraocular lens: Secondary | ICD-10-CM | POA: Diagnosis not present

## 2017-07-16 DIAGNOSIS — H35033 Hypertensive retinopathy, bilateral: Secondary | ICD-10-CM | POA: Diagnosis not present

## 2017-07-16 DIAGNOSIS — H2511 Age-related nuclear cataract, right eye: Secondary | ICD-10-CM | POA: Diagnosis not present

## 2017-08-18 DIAGNOSIS — Z7901 Long term (current) use of anticoagulants: Secondary | ICD-10-CM | POA: Diagnosis not present

## 2017-08-18 DIAGNOSIS — I348 Other nonrheumatic mitral valve disorders: Secondary | ICD-10-CM | POA: Diagnosis not present

## 2017-08-20 DIAGNOSIS — I2510 Atherosclerotic heart disease of native coronary artery without angina pectoris: Secondary | ICD-10-CM | POA: Diagnosis not present

## 2017-08-20 DIAGNOSIS — I480 Paroxysmal atrial fibrillation: Secondary | ICD-10-CM | POA: Diagnosis not present

## 2017-08-20 DIAGNOSIS — N181 Chronic kidney disease, stage 1: Secondary | ICD-10-CM | POA: Diagnosis not present

## 2017-08-20 DIAGNOSIS — Z8679 Personal history of other diseases of the circulatory system: Secondary | ICD-10-CM | POA: Diagnosis not present

## 2017-08-20 DIAGNOSIS — I10 Essential (primary) hypertension: Secondary | ICD-10-CM | POA: Diagnosis not present

## 2017-09-03 DIAGNOSIS — R972 Elevated prostate specific antigen [PSA]: Secondary | ICD-10-CM | POA: Diagnosis not present

## 2017-09-15 DIAGNOSIS — I348 Other nonrheumatic mitral valve disorders: Secondary | ICD-10-CM | POA: Diagnosis not present

## 2017-09-15 DIAGNOSIS — Z23 Encounter for immunization: Secondary | ICD-10-CM | POA: Diagnosis not present

## 2017-09-15 DIAGNOSIS — Z7901 Long term (current) use of anticoagulants: Secondary | ICD-10-CM | POA: Diagnosis not present

## 2017-10-15 DIAGNOSIS — I348 Other nonrheumatic mitral valve disorders: Secondary | ICD-10-CM | POA: Diagnosis not present

## 2017-10-15 DIAGNOSIS — Z7901 Long term (current) use of anticoagulants: Secondary | ICD-10-CM | POA: Diagnosis not present

## 2017-10-23 DIAGNOSIS — Z5181 Encounter for therapeutic drug level monitoring: Secondary | ICD-10-CM | POA: Diagnosis not present

## 2017-10-23 DIAGNOSIS — I129 Hypertensive chronic kidney disease with stage 1 through stage 4 chronic kidney disease, or unspecified chronic kidney disease: Secondary | ICD-10-CM | POA: Diagnosis not present

## 2017-10-23 DIAGNOSIS — I330 Acute and subacute infective endocarditis: Secondary | ICD-10-CM | POA: Diagnosis not present

## 2017-10-23 DIAGNOSIS — E782 Mixed hyperlipidemia: Secondary | ICD-10-CM | POA: Diagnosis not present

## 2017-10-23 DIAGNOSIS — N183 Chronic kidney disease, stage 3 (moderate): Secondary | ICD-10-CM | POA: Diagnosis not present

## 2017-10-23 DIAGNOSIS — M109 Gout, unspecified: Secondary | ICD-10-CM | POA: Diagnosis not present

## 2017-10-23 DIAGNOSIS — Z8719 Personal history of other diseases of the digestive system: Secondary | ICD-10-CM | POA: Diagnosis not present

## 2017-10-23 DIAGNOSIS — Z7901 Long term (current) use of anticoagulants: Secondary | ICD-10-CM | POA: Diagnosis not present

## 2017-10-23 DIAGNOSIS — N2581 Secondary hyperparathyroidism of renal origin: Secondary | ICD-10-CM | POA: Diagnosis not present

## 2017-10-23 DIAGNOSIS — I429 Cardiomyopathy, unspecified: Secondary | ICD-10-CM | POA: Diagnosis not present

## 2017-10-23 DIAGNOSIS — D631 Anemia in chronic kidney disease: Secondary | ICD-10-CM | POA: Diagnosis not present

## 2017-10-23 DIAGNOSIS — E669 Obesity, unspecified: Secondary | ICD-10-CM | POA: Diagnosis not present

## 2017-11-12 DIAGNOSIS — I348 Other nonrheumatic mitral valve disorders: Secondary | ICD-10-CM | POA: Diagnosis not present

## 2017-11-12 DIAGNOSIS — J069 Acute upper respiratory infection, unspecified: Secondary | ICD-10-CM | POA: Diagnosis not present

## 2017-11-12 DIAGNOSIS — Z7901 Long term (current) use of anticoagulants: Secondary | ICD-10-CM | POA: Diagnosis not present

## 2017-12-24 DIAGNOSIS — Z7901 Long term (current) use of anticoagulants: Secondary | ICD-10-CM | POA: Diagnosis not present

## 2017-12-24 DIAGNOSIS — I348 Other nonrheumatic mitral valve disorders: Secondary | ICD-10-CM | POA: Diagnosis not present

## 2018-01-22 DIAGNOSIS — I348 Other nonrheumatic mitral valve disorders: Secondary | ICD-10-CM | POA: Diagnosis not present

## 2018-01-22 DIAGNOSIS — Z7901 Long term (current) use of anticoagulants: Secondary | ICD-10-CM | POA: Diagnosis not present

## 2018-02-16 DIAGNOSIS — R972 Elevated prostate specific antigen [PSA]: Secondary | ICD-10-CM | POA: Diagnosis not present

## 2018-02-25 DIAGNOSIS — R972 Elevated prostate specific antigen [PSA]: Secondary | ICD-10-CM | POA: Diagnosis not present

## 2018-02-25 DIAGNOSIS — N401 Enlarged prostate with lower urinary tract symptoms: Secondary | ICD-10-CM | POA: Diagnosis not present

## 2018-02-25 DIAGNOSIS — R351 Nocturia: Secondary | ICD-10-CM | POA: Diagnosis not present

## 2018-03-02 DIAGNOSIS — E7849 Other hyperlipidemia: Secondary | ICD-10-CM | POA: Diagnosis not present

## 2018-03-02 DIAGNOSIS — R82998 Other abnormal findings in urine: Secondary | ICD-10-CM | POA: Diagnosis not present

## 2018-03-02 DIAGNOSIS — I10 Essential (primary) hypertension: Secondary | ICD-10-CM | POA: Diagnosis not present

## 2018-03-02 DIAGNOSIS — M109 Gout, unspecified: Secondary | ICD-10-CM | POA: Diagnosis not present

## 2018-03-02 DIAGNOSIS — Z7901 Long term (current) use of anticoagulants: Secondary | ICD-10-CM | POA: Diagnosis not present

## 2018-03-02 DIAGNOSIS — R7301 Impaired fasting glucose: Secondary | ICD-10-CM | POA: Diagnosis not present

## 2018-03-04 DIAGNOSIS — Z8679 Personal history of other diseases of the circulatory system: Secondary | ICD-10-CM | POA: Diagnosis not present

## 2018-03-04 DIAGNOSIS — R0602 Shortness of breath: Secondary | ICD-10-CM | POA: Diagnosis not present

## 2018-03-04 DIAGNOSIS — I480 Paroxysmal atrial fibrillation: Secondary | ICD-10-CM | POA: Diagnosis not present

## 2018-03-04 DIAGNOSIS — I10 Essential (primary) hypertension: Secondary | ICD-10-CM | POA: Diagnosis not present

## 2018-03-04 DIAGNOSIS — I2510 Atherosclerotic heart disease of native coronary artery without angina pectoris: Secondary | ICD-10-CM | POA: Diagnosis not present

## 2018-03-09 DIAGNOSIS — Z7901 Long term (current) use of anticoagulants: Secondary | ICD-10-CM | POA: Diagnosis not present

## 2018-03-09 DIAGNOSIS — E668 Other obesity: Secondary | ICD-10-CM | POA: Diagnosis not present

## 2018-03-09 DIAGNOSIS — R7301 Impaired fasting glucose: Secondary | ICD-10-CM | POA: Diagnosis not present

## 2018-03-09 DIAGNOSIS — Z0000 Encounter for general adult medical examination without abnormal findings: Secondary | ICD-10-CM | POA: Diagnosis not present

## 2018-03-09 DIAGNOSIS — Z6831 Body mass index (BMI) 31.0-31.9, adult: Secondary | ICD-10-CM | POA: Diagnosis not present

## 2018-03-09 DIAGNOSIS — M109 Gout, unspecified: Secondary | ICD-10-CM | POA: Diagnosis not present

## 2018-03-09 DIAGNOSIS — Z1389 Encounter for screening for other disorder: Secondary | ICD-10-CM | POA: Diagnosis not present

## 2018-03-09 DIAGNOSIS — N320 Bladder-neck obstruction: Secondary | ICD-10-CM | POA: Diagnosis not present

## 2018-03-09 DIAGNOSIS — I10 Essential (primary) hypertension: Secondary | ICD-10-CM | POA: Diagnosis not present

## 2018-03-09 DIAGNOSIS — Z952 Presence of prosthetic heart valve: Secondary | ICD-10-CM | POA: Diagnosis not present

## 2018-03-09 DIAGNOSIS — E7849 Other hyperlipidemia: Secondary | ICD-10-CM | POA: Diagnosis not present

## 2018-03-09 DIAGNOSIS — N183 Chronic kidney disease, stage 3 (moderate): Secondary | ICD-10-CM | POA: Diagnosis not present

## 2018-03-11 DIAGNOSIS — Z1212 Encounter for screening for malignant neoplasm of rectum: Secondary | ICD-10-CM | POA: Diagnosis not present

## 2018-04-08 DIAGNOSIS — I348 Other nonrheumatic mitral valve disorders: Secondary | ICD-10-CM | POA: Diagnosis not present

## 2018-04-08 DIAGNOSIS — Z7901 Long term (current) use of anticoagulants: Secondary | ICD-10-CM | POA: Diagnosis not present

## 2018-05-06 DIAGNOSIS — I348 Other nonrheumatic mitral valve disorders: Secondary | ICD-10-CM | POA: Diagnosis not present

## 2018-05-06 DIAGNOSIS — Z7901 Long term (current) use of anticoagulants: Secondary | ICD-10-CM | POA: Diagnosis not present

## 2018-06-04 DIAGNOSIS — I348 Other nonrheumatic mitral valve disorders: Secondary | ICD-10-CM | POA: Diagnosis not present

## 2018-06-04 DIAGNOSIS — Z7901 Long term (current) use of anticoagulants: Secondary | ICD-10-CM | POA: Diagnosis not present

## 2018-06-08 DIAGNOSIS — D631 Anemia in chronic kidney disease: Secondary | ICD-10-CM | POA: Diagnosis not present

## 2018-06-08 DIAGNOSIS — I129 Hypertensive chronic kidney disease with stage 1 through stage 4 chronic kidney disease, or unspecified chronic kidney disease: Secondary | ICD-10-CM | POA: Diagnosis not present

## 2018-06-08 DIAGNOSIS — Z0000 Encounter for general adult medical examination without abnormal findings: Secondary | ICD-10-CM | POA: Diagnosis not present

## 2018-06-08 DIAGNOSIS — N2581 Secondary hyperparathyroidism of renal origin: Secondary | ICD-10-CM | POA: Diagnosis not present

## 2018-06-08 DIAGNOSIS — M109 Gout, unspecified: Secondary | ICD-10-CM | POA: Diagnosis not present

## 2018-06-08 DIAGNOSIS — Z7901 Long term (current) use of anticoagulants: Secondary | ICD-10-CM | POA: Diagnosis not present

## 2018-06-08 DIAGNOSIS — E669 Obesity, unspecified: Secondary | ICD-10-CM | POA: Diagnosis not present

## 2018-06-08 DIAGNOSIS — Z8719 Personal history of other diseases of the digestive system: Secondary | ICD-10-CM | POA: Diagnosis not present

## 2018-06-08 DIAGNOSIS — N183 Chronic kidney disease, stage 3 (moderate): Secondary | ICD-10-CM | POA: Diagnosis not present

## 2018-06-08 DIAGNOSIS — I429 Cardiomyopathy, unspecified: Secondary | ICD-10-CM | POA: Diagnosis not present

## 2018-06-08 DIAGNOSIS — I330 Acute and subacute infective endocarditis: Secondary | ICD-10-CM | POA: Diagnosis not present

## 2018-06-08 DIAGNOSIS — E782 Mixed hyperlipidemia: Secondary | ICD-10-CM | POA: Diagnosis not present

## 2018-07-02 DIAGNOSIS — Z7901 Long term (current) use of anticoagulants: Secondary | ICD-10-CM | POA: Diagnosis not present

## 2018-07-02 DIAGNOSIS — I348 Other nonrheumatic mitral valve disorders: Secondary | ICD-10-CM | POA: Diagnosis not present

## 2018-07-22 DIAGNOSIS — H2512 Age-related nuclear cataract, left eye: Secondary | ICD-10-CM | POA: Diagnosis not present

## 2018-07-22 DIAGNOSIS — H35033 Hypertensive retinopathy, bilateral: Secondary | ICD-10-CM | POA: Diagnosis not present

## 2018-07-22 DIAGNOSIS — Z961 Presence of intraocular lens: Secondary | ICD-10-CM | POA: Diagnosis not present

## 2018-07-30 DIAGNOSIS — Z7901 Long term (current) use of anticoagulants: Secondary | ICD-10-CM | POA: Diagnosis not present

## 2018-07-30 DIAGNOSIS — Z952 Presence of prosthetic heart valve: Secondary | ICD-10-CM | POA: Diagnosis not present

## 2018-07-30 DIAGNOSIS — R58 Hemorrhage, not elsewhere classified: Secondary | ICD-10-CM | POA: Diagnosis not present

## 2018-07-30 DIAGNOSIS — I348 Other nonrheumatic mitral valve disorders: Secondary | ICD-10-CM | POA: Diagnosis not present

## 2018-09-01 DIAGNOSIS — R58 Hemorrhage, not elsewhere classified: Secondary | ICD-10-CM | POA: Diagnosis not present

## 2018-09-01 DIAGNOSIS — Z23 Encounter for immunization: Secondary | ICD-10-CM | POA: Diagnosis not present

## 2018-09-01 DIAGNOSIS — Z7901 Long term (current) use of anticoagulants: Secondary | ICD-10-CM | POA: Diagnosis not present

## 2018-09-01 DIAGNOSIS — I348 Other nonrheumatic mitral valve disorders: Secondary | ICD-10-CM | POA: Diagnosis not present

## 2018-09-30 DIAGNOSIS — Z7901 Long term (current) use of anticoagulants: Secondary | ICD-10-CM | POA: Diagnosis not present

## 2018-09-30 DIAGNOSIS — I348 Other nonrheumatic mitral valve disorders: Secondary | ICD-10-CM | POA: Diagnosis not present

## 2018-10-28 DIAGNOSIS — I348 Other nonrheumatic mitral valve disorders: Secondary | ICD-10-CM | POA: Diagnosis not present

## 2018-10-28 DIAGNOSIS — I10 Essential (primary) hypertension: Secondary | ICD-10-CM | POA: Diagnosis not present

## 2018-10-28 DIAGNOSIS — Z7901 Long term (current) use of anticoagulants: Secondary | ICD-10-CM | POA: Diagnosis not present

## 2018-11-11 DIAGNOSIS — I10 Essential (primary) hypertension: Secondary | ICD-10-CM | POA: Diagnosis not present

## 2018-11-11 DIAGNOSIS — E782 Mixed hyperlipidemia: Secondary | ICD-10-CM | POA: Diagnosis not present

## 2018-11-11 DIAGNOSIS — Z8679 Personal history of other diseases of the circulatory system: Secondary | ICD-10-CM | POA: Diagnosis not present

## 2018-11-11 DIAGNOSIS — Z952 Presence of prosthetic heart valve: Secondary | ICD-10-CM | POA: Diagnosis not present

## 2018-11-11 DIAGNOSIS — Z954 Presence of other heart-valve replacement: Secondary | ICD-10-CM | POA: Diagnosis not present

## 2018-11-11 DIAGNOSIS — I2510 Atherosclerotic heart disease of native coronary artery without angina pectoris: Secondary | ICD-10-CM | POA: Diagnosis not present

## 2018-11-25 DIAGNOSIS — I348 Other nonrheumatic mitral valve disorders: Secondary | ICD-10-CM | POA: Diagnosis not present

## 2018-11-25 DIAGNOSIS — Z7901 Long term (current) use of anticoagulants: Secondary | ICD-10-CM | POA: Diagnosis not present

## 2018-12-24 DIAGNOSIS — Z6833 Body mass index (BMI) 33.0-33.9, adult: Secondary | ICD-10-CM | POA: Diagnosis not present

## 2018-12-24 DIAGNOSIS — B349 Viral infection, unspecified: Secondary | ICD-10-CM | POA: Diagnosis not present

## 2018-12-24 DIAGNOSIS — I348 Other nonrheumatic mitral valve disorders: Secondary | ICD-10-CM | POA: Diagnosis not present

## 2018-12-24 DIAGNOSIS — Z7901 Long term (current) use of anticoagulants: Secondary | ICD-10-CM | POA: Diagnosis not present

## 2019-01-04 DIAGNOSIS — D485 Neoplasm of uncertain behavior of skin: Secondary | ICD-10-CM | POA: Diagnosis not present

## 2019-01-04 DIAGNOSIS — L570 Actinic keratosis: Secondary | ICD-10-CM | POA: Diagnosis not present

## 2019-01-04 DIAGNOSIS — Z85828 Personal history of other malignant neoplasm of skin: Secondary | ICD-10-CM | POA: Diagnosis not present

## 2019-01-21 DIAGNOSIS — Z7901 Long term (current) use of anticoagulants: Secondary | ICD-10-CM | POA: Diagnosis not present

## 2019-01-21 DIAGNOSIS — I348 Other nonrheumatic mitral valve disorders: Secondary | ICD-10-CM | POA: Diagnosis not present

## 2019-01-21 DIAGNOSIS — Z6832 Body mass index (BMI) 32.0-32.9, adult: Secondary | ICD-10-CM | POA: Diagnosis not present

## 2019-02-18 DIAGNOSIS — Z6833 Body mass index (BMI) 33.0-33.9, adult: Secondary | ICD-10-CM | POA: Diagnosis not present

## 2019-02-18 DIAGNOSIS — I348 Other nonrheumatic mitral valve disorders: Secondary | ICD-10-CM | POA: Diagnosis not present

## 2019-02-18 DIAGNOSIS — Z7901 Long term (current) use of anticoagulants: Secondary | ICD-10-CM | POA: Diagnosis not present

## 2019-02-22 DIAGNOSIS — N183 Chronic kidney disease, stage 3 (moderate): Secondary | ICD-10-CM | POA: Diagnosis not present

## 2019-02-22 DIAGNOSIS — D631 Anemia in chronic kidney disease: Secondary | ICD-10-CM | POA: Diagnosis not present

## 2019-02-22 DIAGNOSIS — N2581 Secondary hyperparathyroidism of renal origin: Secondary | ICD-10-CM | POA: Diagnosis not present

## 2019-02-22 DIAGNOSIS — E782 Mixed hyperlipidemia: Secondary | ICD-10-CM | POA: Diagnosis not present

## 2019-02-22 DIAGNOSIS — Z0000 Encounter for general adult medical examination without abnormal findings: Secondary | ICD-10-CM | POA: Diagnosis not present

## 2019-02-22 DIAGNOSIS — Z8719 Personal history of other diseases of the digestive system: Secondary | ICD-10-CM | POA: Diagnosis not present

## 2019-02-22 DIAGNOSIS — I129 Hypertensive chronic kidney disease with stage 1 through stage 4 chronic kidney disease, or unspecified chronic kidney disease: Secondary | ICD-10-CM | POA: Diagnosis not present

## 2019-02-22 DIAGNOSIS — I429 Cardiomyopathy, unspecified: Secondary | ICD-10-CM | POA: Diagnosis not present

## 2019-02-22 DIAGNOSIS — M109 Gout, unspecified: Secondary | ICD-10-CM | POA: Diagnosis not present

## 2019-02-22 DIAGNOSIS — N189 Chronic kidney disease, unspecified: Secondary | ICD-10-CM | POA: Diagnosis not present

## 2019-02-22 DIAGNOSIS — Z7901 Long term (current) use of anticoagulants: Secondary | ICD-10-CM | POA: Diagnosis not present

## 2019-02-22 DIAGNOSIS — E669 Obesity, unspecified: Secondary | ICD-10-CM | POA: Diagnosis not present

## 2019-02-22 DIAGNOSIS — I330 Acute and subacute infective endocarditis: Secondary | ICD-10-CM | POA: Diagnosis not present

## 2019-02-23 DIAGNOSIS — R972 Elevated prostate specific antigen [PSA]: Secondary | ICD-10-CM | POA: Diagnosis not present

## 2019-03-04 DIAGNOSIS — N401 Enlarged prostate with lower urinary tract symptoms: Secondary | ICD-10-CM | POA: Diagnosis not present

## 2019-03-04 DIAGNOSIS — R972 Elevated prostate specific antigen [PSA]: Secondary | ICD-10-CM | POA: Diagnosis not present

## 2019-03-04 DIAGNOSIS — R351 Nocturia: Secondary | ICD-10-CM | POA: Diagnosis not present

## 2019-03-16 DIAGNOSIS — I348 Other nonrheumatic mitral valve disorders: Secondary | ICD-10-CM | POA: Diagnosis not present

## 2019-03-16 DIAGNOSIS — Z7901 Long term (current) use of anticoagulants: Secondary | ICD-10-CM | POA: Diagnosis not present

## 2019-03-18 DIAGNOSIS — E7849 Other hyperlipidemia: Secondary | ICD-10-CM | POA: Diagnosis not present

## 2019-03-18 DIAGNOSIS — I129 Hypertensive chronic kidney disease with stage 1 through stage 4 chronic kidney disease, or unspecified chronic kidney disease: Secondary | ICD-10-CM | POA: Diagnosis not present

## 2019-03-18 DIAGNOSIS — R972 Elevated prostate specific antigen [PSA]: Secondary | ICD-10-CM | POA: Diagnosis not present

## 2019-03-18 DIAGNOSIS — R7301 Impaired fasting glucose: Secondary | ICD-10-CM | POA: Diagnosis not present

## 2019-03-18 DIAGNOSIS — Z7901 Long term (current) use of anticoagulants: Secondary | ICD-10-CM | POA: Diagnosis not present

## 2019-03-18 DIAGNOSIS — R42 Dizziness and giddiness: Secondary | ICD-10-CM | POA: Diagnosis not present

## 2019-03-18 DIAGNOSIS — M109 Gout, unspecified: Secondary | ICD-10-CM | POA: Diagnosis not present

## 2019-03-18 DIAGNOSIS — Z952 Presence of prosthetic heart valve: Secondary | ICD-10-CM | POA: Diagnosis not present

## 2019-03-18 DIAGNOSIS — Z9889 Other specified postprocedural states: Secondary | ICD-10-CM | POA: Diagnosis not present

## 2019-03-18 DIAGNOSIS — Z0000 Encounter for general adult medical examination without abnormal findings: Secondary | ICD-10-CM | POA: Diagnosis not present

## 2019-03-18 DIAGNOSIS — Z1339 Encounter for screening examination for other mental health and behavioral disorders: Secondary | ICD-10-CM | POA: Diagnosis not present

## 2019-03-18 DIAGNOSIS — N183 Chronic kidney disease, stage 3 (moderate): Secondary | ICD-10-CM | POA: Diagnosis not present

## 2019-04-20 DIAGNOSIS — Z6832 Body mass index (BMI) 32.0-32.9, adult: Secondary | ICD-10-CM | POA: Diagnosis not present

## 2019-04-20 DIAGNOSIS — I348 Other nonrheumatic mitral valve disorders: Secondary | ICD-10-CM | POA: Diagnosis not present

## 2019-04-20 DIAGNOSIS — Z7901 Long term (current) use of anticoagulants: Secondary | ICD-10-CM | POA: Diagnosis not present

## 2019-04-28 DIAGNOSIS — I483 Typical atrial flutter: Secondary | ICD-10-CM | POA: Diagnosis not present

## 2019-04-28 DIAGNOSIS — I2510 Atherosclerotic heart disease of native coronary artery without angina pectoris: Secondary | ICD-10-CM | POA: Diagnosis not present

## 2019-04-28 DIAGNOSIS — M109 Gout, unspecified: Secondary | ICD-10-CM | POA: Diagnosis present

## 2019-04-28 DIAGNOSIS — N400 Enlarged prostate without lower urinary tract symptoms: Secondary | ICD-10-CM | POA: Diagnosis present

## 2019-04-28 DIAGNOSIS — I10 Essential (primary) hypertension: Secondary | ICD-10-CM | POA: Diagnosis not present

## 2019-04-28 DIAGNOSIS — I471 Supraventricular tachycardia: Secondary | ICD-10-CM | POA: Diagnosis present

## 2019-04-28 DIAGNOSIS — R0602 Shortness of breath: Secondary | ICD-10-CM | POA: Diagnosis not present

## 2019-04-28 DIAGNOSIS — Z87891 Personal history of nicotine dependence: Secondary | ICD-10-CM | POA: Diagnosis not present

## 2019-04-28 DIAGNOSIS — R0600 Dyspnea, unspecified: Secondary | ICD-10-CM | POA: Diagnosis not present

## 2019-04-28 DIAGNOSIS — E785 Hyperlipidemia, unspecified: Secondary | ICD-10-CM | POA: Diagnosis present

## 2019-04-28 DIAGNOSIS — N183 Chronic kidney disease, stage 3 (moderate): Secondary | ICD-10-CM | POA: Diagnosis present

## 2019-04-28 DIAGNOSIS — Z66 Do not resuscitate: Secondary | ICD-10-CM | POA: Diagnosis present

## 2019-04-28 DIAGNOSIS — Z9889 Other specified postprocedural states: Secondary | ICD-10-CM | POA: Diagnosis not present

## 2019-04-28 DIAGNOSIS — Z952 Presence of prosthetic heart valve: Secondary | ICD-10-CM | POA: Diagnosis not present

## 2019-04-28 DIAGNOSIS — Z7901 Long term (current) use of anticoagulants: Secondary | ICD-10-CM | POA: Diagnosis not present

## 2019-04-28 DIAGNOSIS — Z1159 Encounter for screening for other viral diseases: Secondary | ICD-10-CM | POA: Diagnosis not present

## 2019-04-28 DIAGNOSIS — I4891 Unspecified atrial fibrillation: Secondary | ICD-10-CM | POA: Diagnosis not present

## 2019-04-28 DIAGNOSIS — R002 Palpitations: Secondary | ICD-10-CM | POA: Diagnosis not present

## 2019-04-28 DIAGNOSIS — Z8679 Personal history of other diseases of the circulatory system: Secondary | ICD-10-CM | POA: Diagnosis not present

## 2019-04-28 DIAGNOSIS — I1310 Hypertensive heart and chronic kidney disease without heart failure, with stage 1 through stage 4 chronic kidney disease, or unspecified chronic kidney disease: Secondary | ICD-10-CM | POA: Diagnosis present

## 2019-04-28 DIAGNOSIS — I484 Atypical atrial flutter: Secondary | ICD-10-CM | POA: Diagnosis not present

## 2019-04-28 DIAGNOSIS — E7800 Pure hypercholesterolemia, unspecified: Secondary | ICD-10-CM | POA: Diagnosis not present

## 2019-05-04 DIAGNOSIS — Z7901 Long term (current) use of anticoagulants: Secondary | ICD-10-CM | POA: Diagnosis not present

## 2019-05-04 DIAGNOSIS — I4891 Unspecified atrial fibrillation: Secondary | ICD-10-CM | POA: Diagnosis not present

## 2019-05-04 DIAGNOSIS — I348 Other nonrheumatic mitral valve disorders: Secondary | ICD-10-CM | POA: Diagnosis not present

## 2019-05-25 DIAGNOSIS — I348 Other nonrheumatic mitral valve disorders: Secondary | ICD-10-CM | POA: Diagnosis not present

## 2019-05-25 DIAGNOSIS — I4891 Unspecified atrial fibrillation: Secondary | ICD-10-CM | POA: Diagnosis not present

## 2019-05-25 DIAGNOSIS — Z7901 Long term (current) use of anticoagulants: Secondary | ICD-10-CM | POA: Diagnosis not present

## 2019-05-26 DIAGNOSIS — E7800 Pure hypercholesterolemia, unspecified: Secondary | ICD-10-CM | POA: Diagnosis not present

## 2019-05-26 DIAGNOSIS — I10 Essential (primary) hypertension: Secondary | ICD-10-CM | POA: Diagnosis not present

## 2019-05-26 DIAGNOSIS — I484 Atypical atrial flutter: Secondary | ICD-10-CM | POA: Diagnosis not present

## 2019-05-26 DIAGNOSIS — I38 Endocarditis, valve unspecified: Secondary | ICD-10-CM | POA: Diagnosis not present

## 2019-05-26 DIAGNOSIS — I5032 Chronic diastolic (congestive) heart failure: Secondary | ICD-10-CM | POA: Diagnosis not present

## 2019-05-26 DIAGNOSIS — I480 Paroxysmal atrial fibrillation: Secondary | ICD-10-CM | POA: Diagnosis not present

## 2019-05-26 DIAGNOSIS — Z8679 Personal history of other diseases of the circulatory system: Secondary | ICD-10-CM | POA: Diagnosis not present

## 2019-05-26 DIAGNOSIS — R0602 Shortness of breath: Secondary | ICD-10-CM | POA: Diagnosis not present

## 2019-06-29 DIAGNOSIS — Z7901 Long term (current) use of anticoagulants: Secondary | ICD-10-CM | POA: Diagnosis not present

## 2019-06-29 DIAGNOSIS — I4891 Unspecified atrial fibrillation: Secondary | ICD-10-CM | POA: Diagnosis not present

## 2019-06-29 DIAGNOSIS — I348 Other nonrheumatic mitral valve disorders: Secondary | ICD-10-CM | POA: Diagnosis not present

## 2019-07-20 DIAGNOSIS — Z7901 Long term (current) use of anticoagulants: Secondary | ICD-10-CM | POA: Diagnosis not present

## 2019-07-20 DIAGNOSIS — I4891 Unspecified atrial fibrillation: Secondary | ICD-10-CM | POA: Diagnosis not present

## 2019-07-20 DIAGNOSIS — I348 Other nonrheumatic mitral valve disorders: Secondary | ICD-10-CM | POA: Diagnosis not present

## 2019-07-28 DIAGNOSIS — Z961 Presence of intraocular lens: Secondary | ICD-10-CM | POA: Diagnosis not present

## 2019-07-28 DIAGNOSIS — E119 Type 2 diabetes mellitus without complications: Secondary | ICD-10-CM | POA: Diagnosis not present

## 2019-07-28 DIAGNOSIS — H2511 Age-related nuclear cataract, right eye: Secondary | ICD-10-CM | POA: Diagnosis not present

## 2019-07-28 DIAGNOSIS — H35033 Hypertensive retinopathy, bilateral: Secondary | ICD-10-CM | POA: Diagnosis not present

## 2019-08-24 DIAGNOSIS — I4891 Unspecified atrial fibrillation: Secondary | ICD-10-CM | POA: Diagnosis not present

## 2019-08-24 DIAGNOSIS — Z952 Presence of prosthetic heart valve: Secondary | ICD-10-CM | POA: Diagnosis not present

## 2019-08-24 DIAGNOSIS — Z7901 Long term (current) use of anticoagulants: Secondary | ICD-10-CM | POA: Diagnosis not present

## 2019-08-24 DIAGNOSIS — R42 Dizziness and giddiness: Secondary | ICD-10-CM | POA: Diagnosis not present

## 2019-09-29 DIAGNOSIS — I4891 Unspecified atrial fibrillation: Secondary | ICD-10-CM | POA: Diagnosis not present

## 2019-09-29 DIAGNOSIS — M25511 Pain in right shoulder: Secondary | ICD-10-CM | POA: Diagnosis not present

## 2019-09-29 DIAGNOSIS — Z7901 Long term (current) use of anticoagulants: Secondary | ICD-10-CM | POA: Diagnosis not present

## 2019-09-29 DIAGNOSIS — Z23 Encounter for immunization: Secondary | ICD-10-CM | POA: Diagnosis not present

## 2019-09-29 DIAGNOSIS — I359 Nonrheumatic aortic valve disorder, unspecified: Secondary | ICD-10-CM | POA: Diagnosis not present

## 2019-10-20 DIAGNOSIS — I429 Cardiomyopathy, unspecified: Secondary | ICD-10-CM | POA: Diagnosis not present

## 2019-10-20 DIAGNOSIS — N189 Chronic kidney disease, unspecified: Secondary | ICD-10-CM | POA: Diagnosis not present

## 2019-10-20 DIAGNOSIS — D631 Anemia in chronic kidney disease: Secondary | ICD-10-CM | POA: Diagnosis not present

## 2019-10-20 DIAGNOSIS — Z7901 Long term (current) use of anticoagulants: Secondary | ICD-10-CM | POA: Diagnosis not present

## 2019-10-20 DIAGNOSIS — N183 Chronic kidney disease, stage 3 unspecified: Secondary | ICD-10-CM | POA: Diagnosis not present

## 2019-10-20 DIAGNOSIS — Z0000 Encounter for general adult medical examination without abnormal findings: Secondary | ICD-10-CM | POA: Diagnosis not present

## 2019-10-20 DIAGNOSIS — N2581 Secondary hyperparathyroidism of renal origin: Secondary | ICD-10-CM | POA: Diagnosis not present

## 2019-10-20 DIAGNOSIS — Z8719 Personal history of other diseases of the digestive system: Secondary | ICD-10-CM | POA: Diagnosis not present

## 2019-10-20 DIAGNOSIS — I129 Hypertensive chronic kidney disease with stage 1 through stage 4 chronic kidney disease, or unspecified chronic kidney disease: Secondary | ICD-10-CM | POA: Diagnosis not present

## 2019-10-20 DIAGNOSIS — M109 Gout, unspecified: Secondary | ICD-10-CM | POA: Diagnosis not present

## 2019-10-20 DIAGNOSIS — I4891 Unspecified atrial fibrillation: Secondary | ICD-10-CM | POA: Diagnosis not present

## 2019-10-20 DIAGNOSIS — I330 Acute and subacute infective endocarditis: Secondary | ICD-10-CM | POA: Diagnosis not present

## 2019-10-20 DIAGNOSIS — E782 Mixed hyperlipidemia: Secondary | ICD-10-CM | POA: Diagnosis not present

## 2019-11-03 DIAGNOSIS — I4891 Unspecified atrial fibrillation: Secondary | ICD-10-CM | POA: Diagnosis not present

## 2019-11-03 DIAGNOSIS — Z7901 Long term (current) use of anticoagulants: Secondary | ICD-10-CM | POA: Diagnosis not present

## 2019-11-03 DIAGNOSIS — Z952 Presence of prosthetic heart valve: Secondary | ICD-10-CM | POA: Diagnosis not present

## 2019-11-25 DIAGNOSIS — Z952 Presence of prosthetic heart valve: Secondary | ICD-10-CM | POA: Diagnosis not present

## 2019-11-25 DIAGNOSIS — Z7901 Long term (current) use of anticoagulants: Secondary | ICD-10-CM | POA: Diagnosis not present

## 2019-11-25 DIAGNOSIS — I4891 Unspecified atrial fibrillation: Secondary | ICD-10-CM | POA: Diagnosis not present

## 2019-12-01 DIAGNOSIS — I2510 Atherosclerotic heart disease of native coronary artery without angina pectoris: Secondary | ICD-10-CM | POA: Diagnosis not present

## 2019-12-01 DIAGNOSIS — I483 Typical atrial flutter: Secondary | ICD-10-CM | POA: Diagnosis not present

## 2019-12-01 DIAGNOSIS — Z23 Encounter for immunization: Secondary | ICD-10-CM | POA: Diagnosis not present

## 2019-12-01 DIAGNOSIS — Z952 Presence of prosthetic heart valve: Secondary | ICD-10-CM | POA: Diagnosis not present

## 2019-12-07 DIAGNOSIS — I2510 Atherosclerotic heart disease of native coronary artery without angina pectoris: Secondary | ICD-10-CM | POA: Diagnosis not present

## 2019-12-07 DIAGNOSIS — I484 Atypical atrial flutter: Secondary | ICD-10-CM | POA: Diagnosis not present

## 2019-12-07 DIAGNOSIS — R002 Palpitations: Secondary | ICD-10-CM | POA: Diagnosis not present

## 2019-12-07 DIAGNOSIS — I10 Essential (primary) hypertension: Secondary | ICD-10-CM | POA: Diagnosis not present

## 2019-12-08 DIAGNOSIS — I484 Atypical atrial flutter: Secondary | ICD-10-CM | POA: Diagnosis not present

## 2019-12-08 DIAGNOSIS — I4892 Unspecified atrial flutter: Secondary | ICD-10-CM | POA: Diagnosis not present

## 2019-12-29 DIAGNOSIS — Z7901 Long term (current) use of anticoagulants: Secondary | ICD-10-CM | POA: Diagnosis not present

## 2019-12-29 DIAGNOSIS — I4891 Unspecified atrial fibrillation: Secondary | ICD-10-CM | POA: Diagnosis not present

## 2019-12-29 DIAGNOSIS — Z952 Presence of prosthetic heart valve: Secondary | ICD-10-CM | POA: Diagnosis not present

## 2020-01-19 DIAGNOSIS — Z952 Presence of prosthetic heart valve: Secondary | ICD-10-CM | POA: Diagnosis not present

## 2020-01-19 DIAGNOSIS — I2510 Atherosclerotic heart disease of native coronary artery without angina pectoris: Secondary | ICD-10-CM | POA: Diagnosis not present

## 2020-01-19 DIAGNOSIS — I472 Ventricular tachycardia: Secondary | ICD-10-CM | POA: Diagnosis not present

## 2020-01-19 DIAGNOSIS — I10 Essential (primary) hypertension: Secondary | ICD-10-CM | POA: Diagnosis not present

## 2020-01-19 DIAGNOSIS — I484 Atypical atrial flutter: Secondary | ICD-10-CM | POA: Diagnosis not present

## 2020-01-19 DIAGNOSIS — I5032 Chronic diastolic (congestive) heart failure: Secondary | ICD-10-CM | POA: Diagnosis present

## 2020-01-19 DIAGNOSIS — Z8679 Personal history of other diseases of the circulatory system: Secondary | ICD-10-CM | POA: Diagnosis not present

## 2020-01-19 DIAGNOSIS — I480 Paroxysmal atrial fibrillation: Secondary | ICD-10-CM | POA: Diagnosis not present

## 2020-02-02 DIAGNOSIS — Z7901 Long term (current) use of anticoagulants: Secondary | ICD-10-CM | POA: Diagnosis not present

## 2020-02-02 DIAGNOSIS — I359 Nonrheumatic aortic valve disorder, unspecified: Secondary | ICD-10-CM | POA: Diagnosis not present

## 2020-02-02 DIAGNOSIS — I4891 Unspecified atrial fibrillation: Secondary | ICD-10-CM | POA: Diagnosis not present

## 2020-02-23 DIAGNOSIS — R972 Elevated prostate specific antigen [PSA]: Secondary | ICD-10-CM | POA: Diagnosis not present

## 2020-03-01 DIAGNOSIS — N402 Nodular prostate without lower urinary tract symptoms: Secondary | ICD-10-CM | POA: Diagnosis not present

## 2020-03-01 DIAGNOSIS — R972 Elevated prostate specific antigen [PSA]: Secondary | ICD-10-CM | POA: Diagnosis not present

## 2020-03-14 DIAGNOSIS — M109 Gout, unspecified: Secondary | ICD-10-CM | POA: Diagnosis not present

## 2020-03-14 DIAGNOSIS — R7301 Impaired fasting glucose: Secondary | ICD-10-CM | POA: Diagnosis not present

## 2020-03-14 DIAGNOSIS — E7849 Other hyperlipidemia: Secondary | ICD-10-CM | POA: Diagnosis not present

## 2020-03-14 DIAGNOSIS — Z125 Encounter for screening for malignant neoplasm of prostate: Secondary | ICD-10-CM | POA: Diagnosis not present

## 2020-03-21 DIAGNOSIS — E785 Hyperlipidemia, unspecified: Secondary | ICD-10-CM | POA: Diagnosis not present

## 2020-03-21 DIAGNOSIS — E669 Obesity, unspecified: Secondary | ICD-10-CM | POA: Diagnosis not present

## 2020-03-21 DIAGNOSIS — I4891 Unspecified atrial fibrillation: Secondary | ICD-10-CM | POA: Diagnosis not present

## 2020-03-21 DIAGNOSIS — M109 Gout, unspecified: Secondary | ICD-10-CM | POA: Diagnosis not present

## 2020-03-21 DIAGNOSIS — R7301 Impaired fasting glucose: Secondary | ICD-10-CM | POA: Diagnosis not present

## 2020-03-21 DIAGNOSIS — R0609 Other forms of dyspnea: Secondary | ICD-10-CM | POA: Diagnosis not present

## 2020-03-21 DIAGNOSIS — R972 Elevated prostate specific antigen [PSA]: Secondary | ICD-10-CM | POA: Diagnosis not present

## 2020-03-21 DIAGNOSIS — Z7901 Long term (current) use of anticoagulants: Secondary | ICD-10-CM | POA: Diagnosis not present

## 2020-03-21 DIAGNOSIS — N1831 Chronic kidney disease, stage 3a: Secondary | ICD-10-CM | POA: Diagnosis not present

## 2020-03-21 DIAGNOSIS — Z0000 Encounter for general adult medical examination without abnormal findings: Secondary | ICD-10-CM | POA: Diagnosis not present

## 2020-03-21 DIAGNOSIS — Z9889 Other specified postprocedural states: Secondary | ICD-10-CM | POA: Diagnosis not present

## 2020-03-21 DIAGNOSIS — R82998 Other abnormal findings in urine: Secondary | ICD-10-CM | POA: Diagnosis not present

## 2020-03-21 DIAGNOSIS — I129 Hypertensive chronic kidney disease with stage 1 through stage 4 chronic kidney disease, or unspecified chronic kidney disease: Secondary | ICD-10-CM | POA: Insufficient documentation

## 2020-03-21 DIAGNOSIS — I10 Essential (primary) hypertension: Secondary | ICD-10-CM | POA: Diagnosis not present

## 2020-03-29 DIAGNOSIS — I10 Essential (primary) hypertension: Secondary | ICD-10-CM | POA: Diagnosis not present

## 2020-03-29 DIAGNOSIS — I2510 Atherosclerotic heart disease of native coronary artery without angina pectoris: Secondary | ICD-10-CM | POA: Diagnosis not present

## 2020-03-29 DIAGNOSIS — R002 Palpitations: Secondary | ICD-10-CM | POA: Diagnosis not present

## 2020-03-29 DIAGNOSIS — I472 Ventricular tachycardia: Secondary | ICD-10-CM | POA: Diagnosis not present

## 2020-03-29 DIAGNOSIS — E7800 Pure hypercholesterolemia, unspecified: Secondary | ICD-10-CM | POA: Diagnosis not present

## 2020-03-29 DIAGNOSIS — Z79899 Other long term (current) drug therapy: Secondary | ICD-10-CM | POA: Diagnosis not present

## 2020-03-29 DIAGNOSIS — Z5181 Encounter for therapeutic drug level monitoring: Secondary | ICD-10-CM | POA: Diagnosis not present

## 2020-03-29 DIAGNOSIS — I484 Atypical atrial flutter: Secondary | ICD-10-CM | POA: Diagnosis not present

## 2020-03-29 DIAGNOSIS — Z952 Presence of prosthetic heart valve: Secondary | ICD-10-CM | POA: Diagnosis not present

## 2020-04-14 DIAGNOSIS — Z7901 Long term (current) use of anticoagulants: Secondary | ICD-10-CM | POA: Diagnosis not present

## 2020-04-14 DIAGNOSIS — Z952 Presence of prosthetic heart valve: Secondary | ICD-10-CM | POA: Diagnosis not present

## 2020-04-14 DIAGNOSIS — I4891 Unspecified atrial fibrillation: Secondary | ICD-10-CM | POA: Diagnosis not present

## 2020-04-19 DIAGNOSIS — I4891 Unspecified atrial fibrillation: Secondary | ICD-10-CM | POA: Diagnosis not present

## 2020-04-19 DIAGNOSIS — E782 Mixed hyperlipidemia: Secondary | ICD-10-CM | POA: Diagnosis not present

## 2020-04-19 DIAGNOSIS — Z8719 Personal history of other diseases of the digestive system: Secondary | ICD-10-CM | POA: Diagnosis not present

## 2020-04-19 DIAGNOSIS — D631 Anemia in chronic kidney disease: Secondary | ICD-10-CM | POA: Diagnosis not present

## 2020-04-19 DIAGNOSIS — I129 Hypertensive chronic kidney disease with stage 1 through stage 4 chronic kidney disease, or unspecified chronic kidney disease: Secondary | ICD-10-CM | POA: Diagnosis not present

## 2020-04-19 DIAGNOSIS — N189 Chronic kidney disease, unspecified: Secondary | ICD-10-CM | POA: Diagnosis not present

## 2020-04-19 DIAGNOSIS — Z0000 Encounter for general adult medical examination without abnormal findings: Secondary | ICD-10-CM | POA: Diagnosis not present

## 2020-04-19 DIAGNOSIS — I429 Cardiomyopathy, unspecified: Secondary | ICD-10-CM | POA: Diagnosis not present

## 2020-04-19 DIAGNOSIS — M109 Gout, unspecified: Secondary | ICD-10-CM | POA: Diagnosis not present

## 2020-04-19 DIAGNOSIS — N183 Chronic kidney disease, stage 3 unspecified: Secondary | ICD-10-CM | POA: Diagnosis not present

## 2020-04-19 DIAGNOSIS — Z7901 Long term (current) use of anticoagulants: Secondary | ICD-10-CM | POA: Diagnosis not present

## 2020-04-19 DIAGNOSIS — I330 Acute and subacute infective endocarditis: Secondary | ICD-10-CM | POA: Diagnosis not present

## 2020-04-19 DIAGNOSIS — N2581 Secondary hyperparathyroidism of renal origin: Secondary | ICD-10-CM | POA: Diagnosis not present

## 2020-04-21 DIAGNOSIS — R002 Palpitations: Secondary | ICD-10-CM | POA: Diagnosis not present

## 2020-05-09 DIAGNOSIS — I4891 Unspecified atrial fibrillation: Secondary | ICD-10-CM | POA: Diagnosis not present

## 2020-05-09 DIAGNOSIS — Z7901 Long term (current) use of anticoagulants: Secondary | ICD-10-CM | POA: Diagnosis not present

## 2020-05-09 DIAGNOSIS — I359 Nonrheumatic aortic valve disorder, unspecified: Secondary | ICD-10-CM | POA: Diagnosis not present

## 2020-05-09 DIAGNOSIS — I348 Other nonrheumatic mitral valve disorders: Secondary | ICD-10-CM | POA: Diagnosis not present

## 2020-05-25 ENCOUNTER — Encounter (HOSPITAL_BASED_OUTPATIENT_CLINIC_OR_DEPARTMENT_OTHER): Payer: Self-pay | Admitting: *Deleted

## 2020-05-25 ENCOUNTER — Emergency Department (HOSPITAL_BASED_OUTPATIENT_CLINIC_OR_DEPARTMENT_OTHER): Payer: Medicare Other

## 2020-05-25 ENCOUNTER — Emergency Department (HOSPITAL_BASED_OUTPATIENT_CLINIC_OR_DEPARTMENT_OTHER)
Admission: EM | Admit: 2020-05-25 | Discharge: 2020-05-25 | Disposition: A | Discharge status: Other Acute Inpatient Hospital | Payer: Medicare Other | Attending: Emergency Medicine | Admitting: Emergency Medicine

## 2020-05-25 ENCOUNTER — Other Ambulatory Visit: Payer: Self-pay

## 2020-05-25 DIAGNOSIS — R0602 Shortness of breath: Secondary | ICD-10-CM | POA: Diagnosis not present

## 2020-05-25 DIAGNOSIS — I4891 Unspecified atrial fibrillation: Secondary | ICD-10-CM | POA: Diagnosis present

## 2020-05-25 DIAGNOSIS — I2510 Atherosclerotic heart disease of native coronary artery without angina pectoris: Secondary | ICD-10-CM | POA: Diagnosis present

## 2020-05-25 DIAGNOSIS — N1832 Chronic kidney disease, stage 3b: Secondary | ICD-10-CM | POA: Diagnosis present

## 2020-05-25 DIAGNOSIS — N184 Chronic kidney disease, stage 4 (severe): Secondary | ICD-10-CM | POA: Diagnosis not present

## 2020-05-25 DIAGNOSIS — I130 Hypertensive heart and chronic kidney disease with heart failure and stage 1 through stage 4 chronic kidney disease, or unspecified chronic kidney disease: Secondary | ICD-10-CM | POA: Diagnosis present

## 2020-05-25 DIAGNOSIS — I38 Endocarditis, valve unspecified: Secondary | ICD-10-CM | POA: Diagnosis not present

## 2020-05-25 DIAGNOSIS — E872 Acidosis, unspecified: Secondary | ICD-10-CM

## 2020-05-25 DIAGNOSIS — R0902 Hypoxemia: Secondary | ICD-10-CM | POA: Diagnosis not present

## 2020-05-25 DIAGNOSIS — R778 Other specified abnormalities of plasma proteins: Secondary | ICD-10-CM

## 2020-05-25 DIAGNOSIS — R7989 Other specified abnormal findings of blood chemistry: Secondary | ICD-10-CM | POA: Diagnosis not present

## 2020-05-25 DIAGNOSIS — E785 Hyperlipidemia, unspecified: Secondary | ICD-10-CM | POA: Diagnosis present

## 2020-05-25 DIAGNOSIS — T826XXA Infection and inflammatory reaction due to cardiac valve prosthesis, initial encounter: Secondary | ICD-10-CM | POA: Diagnosis present

## 2020-05-25 DIAGNOSIS — Z79899 Other long term (current) drug therapy: Secondary | ICD-10-CM | POA: Diagnosis not present

## 2020-05-25 DIAGNOSIS — S0990XA Unspecified injury of head, initial encounter: Secondary | ICD-10-CM | POA: Diagnosis not present

## 2020-05-25 DIAGNOSIS — E8770 Fluid overload, unspecified: Secondary | ICD-10-CM | POA: Diagnosis not present

## 2020-05-25 DIAGNOSIS — N179 Acute kidney failure, unspecified: Secondary | ICD-10-CM | POA: Diagnosis present

## 2020-05-25 DIAGNOSIS — I5032 Chronic diastolic (congestive) heart failure: Secondary | ICD-10-CM | POA: Diagnosis present

## 2020-05-25 DIAGNOSIS — R918 Other nonspecific abnormal finding of lung field: Secondary | ICD-10-CM | POA: Diagnosis not present

## 2020-05-25 DIAGNOSIS — Z952 Presence of prosthetic heart valve: Secondary | ICD-10-CM | POA: Diagnosis not present

## 2020-05-25 DIAGNOSIS — J9601 Acute respiratory failure with hypoxia: Secondary | ICD-10-CM | POA: Diagnosis present

## 2020-05-25 DIAGNOSIS — R531 Weakness: Secondary | ICD-10-CM | POA: Diagnosis not present

## 2020-05-25 DIAGNOSIS — Z951 Presence of aortocoronary bypass graft: Secondary | ICD-10-CM | POA: Diagnosis not present

## 2020-05-25 DIAGNOSIS — E869 Volume depletion, unspecified: Secondary | ICD-10-CM | POA: Diagnosis present

## 2020-05-25 DIAGNOSIS — M109 Gout, unspecified: Secondary | ICD-10-CM | POA: Diagnosis present

## 2020-05-25 DIAGNOSIS — I483 Typical atrial flutter: Secondary | ICD-10-CM | POA: Diagnosis not present

## 2020-05-25 DIAGNOSIS — I959 Hypotension, unspecified: Secondary | ICD-10-CM | POA: Diagnosis not present

## 2020-05-25 DIAGNOSIS — E7800 Pure hypercholesterolemia, unspecified: Secondary | ICD-10-CM | POA: Diagnosis not present

## 2020-05-25 DIAGNOSIS — I10 Essential (primary) hypertension: Secondary | ICD-10-CM | POA: Diagnosis not present

## 2020-05-25 DIAGNOSIS — R509 Fever, unspecified: Secondary | ICD-10-CM

## 2020-05-25 DIAGNOSIS — R059 Cough, unspecified: Secondary | ICD-10-CM

## 2020-05-25 DIAGNOSIS — Z8679 Personal history of other diseases of the circulatory system: Secondary | ICD-10-CM | POA: Diagnosis not present

## 2020-05-25 DIAGNOSIS — Z20822 Contact with and (suspected) exposure to covid-19: Secondary | ICD-10-CM | POA: Diagnosis present

## 2020-05-25 DIAGNOSIS — Z6837 Body mass index (BMI) 37.0-37.9, adult: Secondary | ICD-10-CM | POA: Diagnosis not present

## 2020-05-25 DIAGNOSIS — I472 Ventricular tachycardia: Secondary | ICD-10-CM | POA: Diagnosis present

## 2020-05-25 DIAGNOSIS — Z7901 Long term (current) use of anticoagulants: Secondary | ICD-10-CM | POA: Diagnosis not present

## 2020-05-25 DIAGNOSIS — W19XXXA Unspecified fall, initial encounter: Secondary | ICD-10-CM | POA: Diagnosis not present

## 2020-05-25 DIAGNOSIS — I4892 Unspecified atrial flutter: Secondary | ICD-10-CM | POA: Diagnosis present

## 2020-05-25 DIAGNOSIS — J9811 Atelectasis: Secondary | ICD-10-CM | POA: Diagnosis not present

## 2020-05-25 DIAGNOSIS — E876 Hypokalemia: Secondary | ICD-10-CM | POA: Diagnosis not present

## 2020-05-25 DIAGNOSIS — R790 Abnormal level of blood mineral: Secondary | ICD-10-CM | POA: Diagnosis not present

## 2020-05-25 DIAGNOSIS — I330 Acute and subacute infective endocarditis: Secondary | ICD-10-CM | POA: Diagnosis present

## 2020-05-25 DIAGNOSIS — Z87891 Personal history of nicotine dependence: Secondary | ICD-10-CM | POA: Diagnosis not present

## 2020-05-25 DIAGNOSIS — R7881 Bacteremia: Secondary | ICD-10-CM | POA: Diagnosis present

## 2020-05-25 DIAGNOSIS — R05 Cough: Secondary | ICD-10-CM | POA: Diagnosis not present

## 2020-05-25 DIAGNOSIS — J811 Chronic pulmonary edema: Secondary | ICD-10-CM | POA: Diagnosis not present

## 2020-05-25 DIAGNOSIS — I951 Orthostatic hypotension: Secondary | ICD-10-CM | POA: Diagnosis present

## 2020-05-25 DIAGNOSIS — B952 Enterococcus as the cause of diseases classified elsewhere: Secondary | ICD-10-CM | POA: Diagnosis present

## 2020-05-25 LAB — CBC WITH DIFFERENTIAL/PLATELET
Abs Immature Granulocytes: 0.11 10*3/uL — ABNORMAL HIGH (ref 0.00–0.07)
Basophils Absolute: 0 10*3/uL (ref 0.0–0.1)
Basophils Relative: 0 %
Eosinophils Absolute: 0 10*3/uL (ref 0.0–0.5)
Eosinophils Relative: 0 %
HCT: 37.5 % — ABNORMAL LOW (ref 39.0–52.0)
Hemoglobin: 12.6 g/dL — ABNORMAL LOW (ref 13.0–17.0)
Immature Granulocytes: 1 %
Lymphocytes Relative: 2 %
Lymphs Abs: 0.2 10*3/uL — ABNORMAL LOW (ref 0.7–4.0)
MCH: 33.2 pg (ref 26.0–34.0)
MCHC: 33.6 g/dL (ref 30.0–36.0)
MCV: 98.9 fL (ref 80.0–100.0)
Monocytes Absolute: 0.4 10*3/uL (ref 0.1–1.0)
Monocytes Relative: 3 %
Neutro Abs: 11.8 10*3/uL — ABNORMAL HIGH (ref 1.7–7.7)
Neutrophils Relative %: 94 %
Platelets: 133 10*3/uL — ABNORMAL LOW (ref 150–400)
RBC: 3.79 MIL/uL — ABNORMAL LOW (ref 4.22–5.81)
RDW: 14.3 % (ref 11.5–15.5)
WBC: 12.6 10*3/uL — ABNORMAL HIGH (ref 4.0–10.5)
nRBC: 0 % (ref 0.0–0.2)

## 2020-05-25 LAB — URINALYSIS, ROUTINE W REFLEX MICROSCOPIC
Bilirubin Urine: NEGATIVE
Glucose, UA: NEGATIVE mg/dL
Ketones, ur: NEGATIVE mg/dL
Leukocytes,Ua: NEGATIVE
Nitrite: NEGATIVE
Protein, ur: NEGATIVE mg/dL
Specific Gravity, Urine: 1.01 (ref 1.005–1.030)
pH: 6 (ref 5.0–8.0)

## 2020-05-25 LAB — COMPREHENSIVE METABOLIC PANEL
ALT: 22 U/L (ref 0–44)
AST: 46 U/L — ABNORMAL HIGH (ref 15–41)
Albumin: 3.6 g/dL (ref 3.5–5.0)
Alkaline Phosphatase: 48 U/L (ref 38–126)
Anion gap: 12 (ref 5–15)
BUN: 42 mg/dL — ABNORMAL HIGH (ref 8–23)
CO2: 24 mmol/L (ref 22–32)
Calcium: 8.7 mg/dL — ABNORMAL LOW (ref 8.9–10.3)
Chloride: 101 mmol/L (ref 98–111)
Creatinine, Ser: 2.16 mg/dL — ABNORMAL HIGH (ref 0.61–1.24)
GFR calc Af Amer: 30 mL/min — ABNORMAL LOW (ref >60)
GFR calc non Af Amer: 26 mL/min — ABNORMAL LOW (ref >60)
Glucose, Bld: 136 mg/dL — ABNORMAL HIGH (ref 70–99)
Potassium: 3.9 mmol/L (ref 3.5–5.1)
Sodium: 137 mmol/L (ref 135–145)
Total Bilirubin: 1.4 mg/dL — ABNORMAL HIGH (ref 0.3–1.2)
Total Protein: 6.4 g/dL — ABNORMAL LOW (ref 6.5–8.1)

## 2020-05-25 LAB — BRAIN NATRIURETIC PEPTIDE: B Natriuretic Peptide: 584.9 pg/mL — ABNORMAL HIGH (ref 0.0–100.0)

## 2020-05-25 LAB — URINALYSIS, MICROSCOPIC (REFLEX)

## 2020-05-25 LAB — TROPONIN I (HIGH SENSITIVITY)
Troponin I (High Sensitivity): 65 ng/L — ABNORMAL HIGH (ref <18)
Troponin I (High Sensitivity): 99 ng/L — ABNORMAL HIGH (ref <18)

## 2020-05-25 LAB — LACTIC ACID, PLASMA
Lactic Acid, Venous: 3.1 mmol/L (ref 0.5–1.9)
Lactic Acid, Venous: 1.6 mmol/L (ref 0.5–1.9)

## 2020-05-25 LAB — SARS CORONAVIRUS 2 BY RT PCR (HOSPITAL ORDER, PERFORMED IN ~~LOC~~ HOSPITAL LAB): SARS Coronavirus 2: NEGATIVE

## 2020-05-25 LAB — PROTIME-INR
INR: 2.5 — ABNORMAL HIGH (ref 0.8–1.2)
Prothrombin Time: 26.6 seconds — ABNORMAL HIGH (ref 11.4–15.2)

## 2020-05-25 LAB — LIPASE, BLOOD: Lipase: 28 U/L (ref 11–51)

## 2020-05-25 MED ORDER — ACETAMINOPHEN 500 MG PO TABS
500.0000 mg | ORAL_TABLET | Freq: Once | ORAL | Status: AC
  Administered 2020-05-25: 500 mg via ORAL
  Filled 2020-05-25: qty 1

## 2020-05-25 MED ORDER — ACETAMINOPHEN 500 MG PO TABS
1000.0000 mg | ORAL_TABLET | Freq: Once | ORAL | Status: AC
  Administered 2020-05-25: 1000 mg via ORAL
  Filled 2020-05-25: qty 2

## 2020-05-25 MED ORDER — SODIUM CHLORIDE 0.9 % IV BOLUS
1000.0000 mL | Freq: Once | INTRAVENOUS | Status: AC
  Administered 2020-05-25: 1000 mL via INTRAVENOUS

## 2020-05-25 NOTE — ED Triage Notes (Signed)
Brought in by EMS from home , generalized weakness and fever x 24 hrs , temp 101.0

## 2020-05-25 NOTE — ED Notes (Signed)
Date and time results received: 05/25/20 1000 (use smartphrase ".now" to insert current time)  Test: Lactic acid Critical Value: 3.1  Name of Provider Notified: Dr. Sherry Ruffing  Orders Received? Or Actions Taken?:

## 2020-05-25 NOTE — ED Triage Notes (Signed)
Per EMS:  Pt having some generalized weakness and hypotension.  Some fever.  No tachycardia and no tachypnea.  Pt alert and oriented.  Some orthostatic changes at home.  BP dropped on standing.  IV established and NS bolus given.

## 2020-05-25 NOTE — ED Notes (Signed)
ED Provider at bedside. 

## 2020-05-25 NOTE — ED Provider Notes (Signed)
[facility] EMERGENCY DEPARTMENT Provider Note   CSN: [id number] Arrival date & time: 05/25/20  5643     History Chief Complaint  Patient presents with  . Hypotension    Steve Riddle. is a 86 y.o. male.  The history is provided by the patient, the spouse, the EMS personnel and medical records. No language interpreter was used.  Fever Max temp prior to arrival:  101 Temp source:  Oral Severity:  Severe Onset quality:  Gradual Duration:  1 day Timing:  Constant Progression:  Waxing and waning Chronicity:  New Relieved by:  Nothing Worsened by:  Nothing Associated symptoms: chills, cough and dysuria   Associated symptoms: no chest pain, no confusion, no congestion, no diarrhea, no headaches, no myalgias, no nausea, no rash, no rhinorrhea, no somnolence, no sore throat and no vomiting        Past Medical History:  Diagnosis Date  . Anemia   . Arthritis   . Diverticulosis   . Endocarditis   . GI bleed   . Gout   . Heart murmur   . Hx of adenomatous colonic polyps   . Hyperlipemia   . Hyperparathyroidism ([place])   . Hypertension   . Nephrosclerosis     Patient Active Problem List   Diagnosis Date Noted  . Black stools 04/01/2014  . GI bleed 04/01/2014  . Occult blood in stools 08/11/2012  . Chronic anticoagulation 08/11/2012    Past Surgical History:  Procedure Laterality Date  . AORTIC AND MITRAL VALVE REPLACEMENT    . CATARACT EXTRACTION Left    with lens implant  . CORONARY ARTERY BYPASS GRAFT    . MITRAL VALVE REPLACEMENT    . ORIF ARM         Family History  Problem Relation Age of Onset  . Hypertension Mother   . Stroke Mother   . Hypertension Father   . Breast cancer Daughter   . Uterine cancer Daughter   . Liver cancer Brother 36    Social History   Tobacco Use  . Smoking status: Never Smoker  . Smokeless tobacco: Never Used  Substance Use Topics  . Alcohol use: No  . Drug use: No    Home Medications Prior to  Admission medications   Medication Sig Start Date End Date Taking? Authorizing Provider  acetaminophen (TYLENOL) 650 MG CR tablet Take 650 mg by mouth every 8 (eight) hours as needed.    [provider]  allopurinol (ZYLOPRIM) 100 MG tablet Take 400 mg by mouth daily.    [provider]  bumetanide (BUMEX) 2 MG tablet Take 2 tablets by mouth in the morning and 1 tablet in the evening    [provider]  calcitRIOL (ROCALTROL) 0.5 MCG capsule Take one tablet by mouth daily 08/10/12   [provider]  cetirizine (ZYRTEC) 10 MG tablet Take 10 mg by mouth daily.    [provider]  diphenhydrAMINE (BENADRYL) 25 MG tablet Take 25 mg by mouth at bedtime as needed. When working with Loss adjuster, chartered, Historical, MD  EPINEPHrine (EPIPEN JR) 0.15 MG/0.3ML injection Inject 0.15 mg into the muscle as needed.    [provider]  fish oil-omega-3 fatty acids 1000 MG capsule Take 1 g by mouth daily.    [provider]  gemfibrozil (LOPID) 600 MG tablet Take 600 mg by mouth 2 (two) times daily.    [provider]  glucosamine-chondroitin 500-400 MG tablet Take 1 tablet by  mouth daily.     [provider]  metoprolol tartrate (LOPRESSOR) 25 MG tablet Take 25 mg by mouth 2 (two) times daily.  08/03/12   [provider]  Multiple Vitamins-Minerals (MULTIVITAMIN WITH MINERALS) tablet Take 1 tablet by mouth daily.    [provider]  omeprazole (PRILOSEC) 20 MG capsule Take 1 capsule (20 mg total) by mouth daily. 06/20/15   [place]af[place] Dragon, MD  potassium chloride SA (K-DUR,KLOR-CON) 20 MEQ tablet Take 20 mEq by mouth 2 (two) times daily.    [provider]  pravastatin (PRAVACHOL) 40 MG tablet Take 80 mg by mouth daily.     [provider]  tamsulosin (FLOMAX) 0.4 MG CAPS capsule Take 0.4 mg by mouth daily.    [provider]  warfarin (COUMADIN) 5 MG tablet Take 5 mg by mouth daily.     [provider]    Allergies    Bee venom and Ace inhibitors  Review of Systems   Review of Systems  Constitutional: Positive for chills, fatigue and fever. Negative for diaphoresis.  HENT: Negative for congestion, rhinorrhea and sore throat.   Eyes: Negative for visual disturbance.  Respiratory: Positive for cough, chest tightness and shortness of breath. Negative for wheezing.   Cardiovascular: Negative for chest pain, palpitations and leg swelling.  Gastrointestinal: Negative for abdominal pain, constipation, diarrhea, nausea and vomiting.  Genitourinary: Positive for dysuria. Negative for flank pain.  Musculoskeletal: Negative for back pain, myalgias and neck pain.  Skin: Negative for rash and wound.  Neurological: Negative for dizziness, light-headedness and headaches.  Psychiatric/Behavioral: Negative for agitation and confusion.  All other systems reviewed and are negative.   Physical Exam Updated Vital Signs BP (!) 104/54   Pulse 62   Temp (!) 103.2 F (39.6 C) (Oral)   Resp 20   SpO2 99%   Physical Exam Vitals and nursing note reviewed.  Constitutional:      General: He is not in acute distress.    Appearance: He is well-developed. He is ill-appearing. He is not toxic-appearing or diaphoretic.  HENT:     Head: Normocephalic and atraumatic.     Nose: No congestion or rhinorrhea.     Mouth/Throat:     Mouth: Mucous membranes are dry.     Pharynx: No oropharyngeal exudate or posterior oropharyngeal erythema.  Eyes:     Extraocular Movements: Extraocular movements intact.     Conjunctiva/sclera: Conjunctivae normal.     Pupils: Pupils are equal, round, and reactive to light.  Cardiovascular:     Rate and Rhythm: Normal rate and regular rhythm.     Pulses: Normal pulses.     Heart sounds: Murmur present.  Pulmonary:     Effort: Pulmonary effort is normal. No respiratory distress.     Breath sounds: Rhonchi and rales present.  Abdominal:     General: Abdomen is  flat.     Palpations: Abdomen is soft.     Tenderness: There is no abdominal tenderness. There is no right CVA tenderness, left CVA tenderness, guarding or rebound.  Musculoskeletal:        General: No tenderness.     Cervical back: Neck supple. No tenderness.     Right lower leg: Edema present.     Left lower leg: Edema present.  Skin:    General: Skin is warm and dry.     Capillary Refill: Capillary refill takes less than 2 seconds.     Findings: No erythema or rash.  Neurological:     General: No focal deficit present.     Mental Status: He is alert.  Psychiatric:        Mood and Affect: Mood normal.     ED Results / Procedures / Treatments   Labs (all labs ordered are listed, but only abnormal results are displayed) Labs Reviewed  CBC WITH DIFFERENTIAL/PLATELET - Abnormal; Notable for the following components:      Result Value   WBC 12.6 (*)    RBC 3.79 (*)    Hemoglobin 12.6 (*)    HCT 37.5 (*)    Platelets 133 (*)    Neutro Abs 11.8 (*)    Lymphs Abs 0.2 (*)    Abs Immature Granulocytes 0.11 (*)    All other components within normal limits  COMPREHENSIVE METABOLIC PANEL - Abnormal; Notable for the following components:   Glucose, Bld 136 (*)    BUN 42 (*)    Creatinine, Ser 2.16 (*)    Calcium 8.7 (*)    Total Protein 6.4 (*)    AST 46 (*)    Total Bilirubin 1.4 (*)    GFR calc non Af Amer 26 (*)    GFR calc Af Amer 30 (*)    All other components within normal limits  LACTIC ACID, PLASMA - Abnormal; Notable for the following components:   Lactic Acid, Venous 3.1 (*)    All other components within normal limits  PROTIME-INR - Abnormal; Notable for the following components:   Prothrombin Time 26.6 (*)    INR 2.5 (*)    All other components within normal limits  BRAIN NATRIURETIC PEPTIDE - Abnormal; Notable for the following components:   B Natriuretic Peptide 584.9 (*)    All other components within normal limits  URINALYSIS, ROUTINE W REFLEX MICROSCOPIC -  Abnormal; Notable for the following components:   Hgb urine dipstick LARGE (*)    All other components within normal limits  URINALYSIS, MICROSCOPIC (REFLEX) - Abnormal; Notable for the following components:   Bacteria, UA RARE (*)    All other components within normal limits  TROPONIN I (HIGH SENSITIVITY) - Abnormal; Notable for the following components:   Troponin I (High Sensitivity) 65 (*)    All other components within normal limits  TROPONIN I (HIGH SENSITIVITY) - Abnormal; Notable for the following components:   Troponin I (High Sensitivity) 99 (*)    All other components within normal limits  SARS CORONAVIRUS 2 BY RT PCR (HOSPITAL ORDER, [facility])  CULTURE, BLOOD (ROUTINE X 2)  CULTURE, BLOOD (ROUTINE X 2)  URINE CULTURE  LIPASE, BLOOD  LACTIC ACID, PLASMA    EKG EKG Interpretation  Date/Time:  Thursday May 25 2020 09:18:30 EDT Ventricular Rate:  76 PR Interval:    QRS Duration: 109 QT Interval:  422 QTC Calculation: 475 R Axis:   70 Text Interpretation: sinus Borderline repolarization abnormality When compared yo prior, similar appearance sinus  rhythm, Confirmed by Antony Blackbird [phone]) on 05/25/2020 10:06:09 AM   Radiology DG Chest Portable 1 View  Result Date: 05/25/2020 CLINICAL DATA:  Cough and fever with weakness and hypotension EXAM: PORTABLE CHEST 1 VIEW COMPARISON:  11/18/2005 FINDINGS: Cardiomegaly. There has been prior median sternotomy. There is no edema, consolidation, effusion, or pneumothorax. Mild vascular congestion. IMPRESSION: Cardiomegaly and mild vascular congestion. Electronically Signed   By: Monte Fantasia M.D.   On: 05/25/2020 10:24    Procedures Procedures (including critical care time)  Medications Ordered  in ED Medications  acetaminophen (TYLENOL) tablet 1,000 mg (1,000 mg Oral Given 05/25/20 0916)  sodium chloride 0.9 % bolus 1,000 mL (0 mLs Intravenous Stopped 05/25/20 1050)    ED Course  I have reviewed  the triage vital signs and the nursing notes.  Pertinent labs & imaging results that were available during my care of the patient were reviewed by me and considered in my medical decision making (see chart for details).    MDM Rules/Calculators/A&P                      Rumeal Cullipher. is a 86 y.o. male with a past medical history significant for prior endocarditis, hyperparathyroidism, hypertension, hyperlipidemia, aortic and mitral replacement on Coumadin therapy, prior CABG, and prior GI bleeding who presents with hypotension, fatigue, lightheadedness, shortness of breath, hypoxia, cough, dysuria, and malaise.  Patient reports that symptoms began yesterday and he called EMS today.  Patient was found to have a blood pressure of 60 when he tried to stand up with EMS but then after laying down getting some fluids blood pressure improved around 086 systolic.  Patient reports he has had some nonproductive cough as well as malaise and fatigue.  He denies any vomiting but reports some mild nausea.  Denies any abdominal pain or chest pain.  Denies constipation or diarrhea but does report some dysuria.  He does have history of pneumonia he reports but no history of UTIs.  He denies any recent trauma.  Denies other medication changes.  On arrival, patient was found to have a temperature of 103.2.  He is not tachycardic or tachypneic and blood pressure is now just over 578 systolic.  We will give some more fluids.  He was hypoxic and we placed him on 2 L nasal cannula for oxygen saturation around 85% on room air on arrival.  Patient's lungs had some rhonchi and rales bilaterally but chest and abdomen nontender.  No focal neurologic deficits.  No rashes seen.  Patient resting comfortably but warm to the touch.  As patient's blood pressure has improved, he is technically not in septic shock however I am concerned about infection either with pneumonia with his hypoxia and cough versus UTI given the dysuria.  We will  get labs started including chest x-ray and urinalysis.  Although he has had a Covid vaccination, we will check for coronavirus.  Anticipate admission for further management.  12:51 PM Patient's work-up is begun to return.  His urine does not show evidence of nitrites or leukocytes, doubt urinary tract infection.  Lactic acid was elevated at 3.1 and he was given some fluids initially.  We will trend this.  His PCR coronavirus test was negative.  CBC does show a leukocytosis and similar anemia to prior.  Metabolic panel shows an AKI of 2.16 however he reports verbally that his most recent creatinine at his PCP was 1.9.  Lipase not elevated.  Troponin is elevated at 65, we will trend.  Most concerning is his BNP is 584.9 and there is no prior to compare and his chest x-ray shows cardiomegaly and some vascular congestion.  They do not see evidence of pneumonia or pneumothorax on the imaging.  Clinically I am concerned that patient has a viral infection contributing to his fever, malaise, and soft blood pressures but I am concerned that he is developing some heart failure causing the vascular congestion, elevated BNP, and contributing to the hypoxia.  Had a long conversation with patient,  wife, and daughter and they are requesting he be admitted to [facility] for further fluid balance management, infection monitoring, and likely diuresis in the setting of new heart failure.  Patient is a patient of Dr. Schedule with cardiology at [facility] and they want him admitted there.  We will start the process of transfer and admission.  Spoke to Dr. Baron Hamper with [facility] who accept the patient in transfer for the new heart failure, orthostasis with soft blood pressures, and likely viral infection with lactic acidosis.  They requested no antibiotics until they see him and agreed with holding on Lasix due to the bump in creatinine and monitoring his fluid balance.  [facility] will send a ride when I have a bed this  afternoon.    Final Clinical Impression(s) / ED Diagnoses Final diagnoses:  Hypervolemia, unspecified hypervolemia type  Elevated brain natriuretic peptide (BNP) level  Elevated troponin  Shortness of breath  Fever, unspecified fever cause  Cough  Lactic acidosis     Clinical Impression: 1. Hypervolemia, unspecified hypervolemia type   2. Elevated brain natriuretic peptide (BNP) level   3. Elevated troponin   4. Shortness of breath   5. Fever, unspecified fever cause   6. Cough   7. Lactic acidosis     Disposition: Transfer to [facility] cardiology for admission.  Accepting physician is Dr. Baron Hamper.  This note was prepared with assistance of Systems analyst. Occasional wrong-word or sound-a-like substitutions may have occurred due to the inherent limitations of voice recognition software.     Anabelle Bungert, Gwenyth Allegra, MD 05/25/20 [phone]

## 2020-05-26 LAB — BLOOD CULTURE ID PANEL (REFLEXED)

## 2020-05-26 LAB — URINE CULTURE: Culture: 20000 — AB

## 2020-05-28 LAB — CULTURE, BLOOD (ROUTINE X 2)
Special Requests: ADEQUATE
Special Requests: ADEQUATE

## 2020-05-29 MED ORDER — LIDOCAINE HCL 1 % IJ SOLN
0.50 | INTRAMUSCULAR | Status: DC
Start: ? — End: 2020-05-29

## 2020-05-29 MED ORDER — GENERIC EXTERNAL MEDICATION
400.00 | Status: DC
Start: 2020-05-29 — End: 2020-05-29

## 2020-05-29 MED ORDER — SENNOSIDES-DOCUSATE SODIUM 8.6-50 MG PO TABS
2.00 | ORAL_TABLET | ORAL | Status: DC
Start: ? — End: 2020-05-29

## 2020-05-29 MED ORDER — AMIODARONE HCL 200 MG PO TABS
100.00 | ORAL_TABLET | ORAL | Status: DC
Start: 2020-05-29 — End: 2020-05-29

## 2020-05-29 MED ORDER — GENERIC EXTERNAL MEDICATION
3.00 | Status: DC
Start: 2020-05-29 — End: 2020-05-29

## 2020-05-29 MED ORDER — METOPROLOL TARTRATE 25 MG PO TABS
12.50 | ORAL_TABLET | ORAL | Status: DC
Start: 2020-05-29 — End: 2020-05-29

## 2020-05-29 MED ORDER — GENERIC EXTERNAL MEDICATION
6.00 | Status: DC
Start: 2020-05-30 — End: 2020-05-29

## 2020-05-29 MED ORDER — GUAIFENESIN ER 600 MG PO TB12
600.00 | ORAL_TABLET | ORAL | Status: DC
Start: ? — End: 2020-05-29

## 2020-05-29 MED ORDER — GENERIC EXTERNAL MEDICATION
2.00 | Status: DC
Start: 2020-05-29 — End: 2020-05-29

## 2020-05-29 MED ORDER — GABAPENTIN 100 MG PO CAPS
100.00 | ORAL_CAPSULE | ORAL | Status: DC
Start: 2020-05-29 — End: 2020-05-29

## 2020-05-29 MED ORDER — IPRATROPIUM-ALBUTEROL 0.5-2.5 (3) MG/3ML IN SOLN
3.00 | RESPIRATORY_TRACT | Status: DC
Start: 2020-05-29 — End: 2020-05-29

## 2020-05-29 MED ORDER — TAMSULOSIN HCL 0.4 MG PO CAPS
0.80 | ORAL_CAPSULE | ORAL | Status: DC
Start: 2020-05-29 — End: 2020-05-29

## 2020-05-29 MED ORDER — ACETAMINOPHEN 325 MG PO TABS
650.00 | ORAL_TABLET | ORAL | Status: DC
Start: ? — End: 2020-05-29

## 2020-05-29 MED ORDER — HEPARIN SOD (PORCINE) IN D5W 100 UNIT/ML IV SOLN
1500.00 | INTRAVENOUS | Status: DC
Start: ? — End: 2020-05-29

## 2020-05-29 MED ORDER — BUMETANIDE 2 MG PO TABS
2.00 | ORAL_TABLET | ORAL | Status: DC
Start: 2020-05-29 — End: 2020-05-29

## 2020-06-02 DIAGNOSIS — Z951 Presence of aortocoronary bypass graft: Secondary | ICD-10-CM | POA: Diagnosis not present

## 2020-06-02 DIAGNOSIS — Z452 Encounter for adjustment and management of vascular access device: Secondary | ICD-10-CM | POA: Diagnosis not present

## 2020-06-02 DIAGNOSIS — M6281 Muscle weakness (generalized): Secondary | ICD-10-CM | POA: Diagnosis not present

## 2020-06-02 DIAGNOSIS — N400 Enlarged prostate without lower urinary tract symptoms: Secondary | ICD-10-CM | POA: Diagnosis not present

## 2020-06-02 DIAGNOSIS — E210 Primary hyperparathyroidism: Secondary | ICD-10-CM | POA: Diagnosis not present

## 2020-06-02 DIAGNOSIS — B952 Enterococcus as the cause of diseases classified elsewhere: Secondary | ICD-10-CM | POA: Diagnosis not present

## 2020-06-02 DIAGNOSIS — R7881 Bacteremia: Secondary | ICD-10-CM | POA: Diagnosis not present

## 2020-06-02 DIAGNOSIS — T826XXA Infection and inflammatory reaction due to cardiac valve prosthesis, initial encounter: Secondary | ICD-10-CM | POA: Diagnosis not present

## 2020-06-02 DIAGNOSIS — Z7901 Long term (current) use of anticoagulants: Secondary | ICD-10-CM | POA: Diagnosis not present

## 2020-06-02 DIAGNOSIS — N269 Renal sclerosis, unspecified: Secondary | ICD-10-CM | POA: Diagnosis not present

## 2020-06-02 DIAGNOSIS — I38 Endocarditis, valve unspecified: Secondary | ICD-10-CM | POA: Diagnosis not present

## 2020-06-02 DIAGNOSIS — D649 Anemia, unspecified: Secondary | ICD-10-CM | POA: Diagnosis not present

## 2020-06-02 DIAGNOSIS — Z5181 Encounter for therapeutic drug level monitoring: Secondary | ICD-10-CM | POA: Diagnosis not present

## 2020-06-05 DIAGNOSIS — R7881 Bacteremia: Secondary | ICD-10-CM | POA: Diagnosis not present

## 2020-06-05 DIAGNOSIS — M6281 Muscle weakness (generalized): Secondary | ICD-10-CM | POA: Diagnosis not present

## 2020-06-05 DIAGNOSIS — I38 Endocarditis, valve unspecified: Secondary | ICD-10-CM | POA: Diagnosis not present

## 2020-06-05 DIAGNOSIS — D649 Anemia, unspecified: Secondary | ICD-10-CM | POA: Diagnosis not present

## 2020-06-05 DIAGNOSIS — T826XXA Infection and inflammatory reaction due to cardiac valve prosthesis, initial encounter: Secondary | ICD-10-CM | POA: Diagnosis not present

## 2020-06-05 DIAGNOSIS — B952 Enterococcus as the cause of diseases classified elsewhere: Secondary | ICD-10-CM | POA: Diagnosis not present

## 2020-06-08 DIAGNOSIS — M6281 Muscle weakness (generalized): Secondary | ICD-10-CM | POA: Diagnosis not present

## 2020-06-08 DIAGNOSIS — D649 Anemia, unspecified: Secondary | ICD-10-CM | POA: Diagnosis not present

## 2020-06-08 DIAGNOSIS — T826XXA Infection and inflammatory reaction due to cardiac valve prosthesis, initial encounter: Secondary | ICD-10-CM | POA: Diagnosis not present

## 2020-06-08 DIAGNOSIS — B952 Enterococcus as the cause of diseases classified elsewhere: Secondary | ICD-10-CM | POA: Diagnosis not present

## 2020-06-08 DIAGNOSIS — R7881 Bacteremia: Secondary | ICD-10-CM | POA: Diagnosis not present

## 2020-06-08 DIAGNOSIS — I38 Endocarditis, valve unspecified: Secondary | ICD-10-CM | POA: Diagnosis not present

## 2020-06-12 DIAGNOSIS — I38 Endocarditis, valve unspecified: Secondary | ICD-10-CM | POA: Diagnosis not present

## 2020-06-12 DIAGNOSIS — M6281 Muscle weakness (generalized): Secondary | ICD-10-CM | POA: Diagnosis not present

## 2020-06-12 DIAGNOSIS — B952 Enterococcus as the cause of diseases classified elsewhere: Secondary | ICD-10-CM | POA: Diagnosis not present

## 2020-06-12 DIAGNOSIS — T826XXA Infection and inflammatory reaction due to cardiac valve prosthesis, initial encounter: Secondary | ICD-10-CM | POA: Diagnosis not present

## 2020-06-12 DIAGNOSIS — D649 Anemia, unspecified: Secondary | ICD-10-CM | POA: Diagnosis not present

## 2020-06-12 DIAGNOSIS — R7881 Bacteremia: Secondary | ICD-10-CM | POA: Diagnosis not present

## 2020-06-14 DIAGNOSIS — D649 Anemia, unspecified: Secondary | ICD-10-CM | POA: Diagnosis not present

## 2020-06-14 DIAGNOSIS — T826XXA Infection and inflammatory reaction due to cardiac valve prosthesis, initial encounter: Secondary | ICD-10-CM | POA: Diagnosis not present

## 2020-06-14 DIAGNOSIS — B952 Enterococcus as the cause of diseases classified elsewhere: Secondary | ICD-10-CM | POA: Diagnosis not present

## 2020-06-14 DIAGNOSIS — R7881 Bacteremia: Secondary | ICD-10-CM | POA: Diagnosis not present

## 2020-06-14 DIAGNOSIS — M6281 Muscle weakness (generalized): Secondary | ICD-10-CM | POA: Diagnosis not present

## 2020-06-14 DIAGNOSIS — I38 Endocarditis, valve unspecified: Secondary | ICD-10-CM | POA: Diagnosis not present

## 2020-06-19 DIAGNOSIS — I38 Endocarditis, valve unspecified: Secondary | ICD-10-CM | POA: Diagnosis not present

## 2020-06-19 DIAGNOSIS — M6281 Muscle weakness (generalized): Secondary | ICD-10-CM | POA: Diagnosis not present

## 2020-06-19 DIAGNOSIS — B952 Enterococcus as the cause of diseases classified elsewhere: Secondary | ICD-10-CM | POA: Diagnosis not present

## 2020-06-19 DIAGNOSIS — D649 Anemia, unspecified: Secondary | ICD-10-CM | POA: Diagnosis not present

## 2020-06-19 DIAGNOSIS — R7881 Bacteremia: Secondary | ICD-10-CM | POA: Diagnosis not present

## 2020-06-19 DIAGNOSIS — T826XXA Infection and inflammatory reaction due to cardiac valve prosthesis, initial encounter: Secondary | ICD-10-CM | POA: Diagnosis not present

## 2020-06-21 DIAGNOSIS — I38 Endocarditis, valve unspecified: Secondary | ICD-10-CM | POA: Diagnosis not present

## 2020-06-21 DIAGNOSIS — D649 Anemia, unspecified: Secondary | ICD-10-CM | POA: Diagnosis not present

## 2020-06-21 DIAGNOSIS — B952 Enterococcus as the cause of diseases classified elsewhere: Secondary | ICD-10-CM | POA: Diagnosis not present

## 2020-06-21 DIAGNOSIS — M6281 Muscle weakness (generalized): Secondary | ICD-10-CM | POA: Diagnosis not present

## 2020-06-21 DIAGNOSIS — R7881 Bacteremia: Secondary | ICD-10-CM | POA: Diagnosis not present

## 2020-06-21 DIAGNOSIS — T826XXA Infection and inflammatory reaction due to cardiac valve prosthesis, initial encounter: Secondary | ICD-10-CM | POA: Diagnosis not present

## 2020-06-27 DIAGNOSIS — B952 Enterococcus as the cause of diseases classified elsewhere: Secondary | ICD-10-CM | POA: Diagnosis not present

## 2020-06-27 DIAGNOSIS — D649 Anemia, unspecified: Secondary | ICD-10-CM | POA: Diagnosis not present

## 2020-06-27 DIAGNOSIS — M6281 Muscle weakness (generalized): Secondary | ICD-10-CM | POA: Diagnosis not present

## 2020-06-27 DIAGNOSIS — T826XXA Infection and inflammatory reaction due to cardiac valve prosthesis, initial encounter: Secondary | ICD-10-CM | POA: Diagnosis not present

## 2020-06-27 DIAGNOSIS — I38 Endocarditis, valve unspecified: Secondary | ICD-10-CM | POA: Diagnosis not present

## 2020-06-27 DIAGNOSIS — R7881 Bacteremia: Secondary | ICD-10-CM | POA: Diagnosis not present

## 2020-07-02 DIAGNOSIS — B952 Enterococcus as the cause of diseases classified elsewhere: Secondary | ICD-10-CM | POA: Diagnosis not present

## 2020-07-02 DIAGNOSIS — Z7901 Long term (current) use of anticoagulants: Secondary | ICD-10-CM | POA: Diagnosis not present

## 2020-07-02 DIAGNOSIS — Z5181 Encounter for therapeutic drug level monitoring: Secondary | ICD-10-CM | POA: Diagnosis not present

## 2020-07-02 DIAGNOSIS — Z951 Presence of aortocoronary bypass graft: Secondary | ICD-10-CM | POA: Diagnosis not present

## 2020-07-02 DIAGNOSIS — Z452 Encounter for adjustment and management of vascular access device: Secondary | ICD-10-CM | POA: Diagnosis not present

## 2020-07-02 DIAGNOSIS — T826XXA Infection and inflammatory reaction due to cardiac valve prosthesis, initial encounter: Secondary | ICD-10-CM | POA: Diagnosis not present

## 2020-07-02 DIAGNOSIS — D649 Anemia, unspecified: Secondary | ICD-10-CM | POA: Diagnosis not present

## 2020-07-02 DIAGNOSIS — R7881 Bacteremia: Secondary | ICD-10-CM | POA: Diagnosis not present

## 2020-07-02 DIAGNOSIS — E210 Primary hyperparathyroidism: Secondary | ICD-10-CM | POA: Diagnosis not present

## 2020-07-02 DIAGNOSIS — I38 Endocarditis, valve unspecified: Secondary | ICD-10-CM | POA: Diagnosis not present

## 2020-07-02 DIAGNOSIS — N269 Renal sclerosis, unspecified: Secondary | ICD-10-CM | POA: Diagnosis not present

## 2020-07-02 DIAGNOSIS — N400 Enlarged prostate without lower urinary tract symptoms: Secondary | ICD-10-CM | POA: Diagnosis not present

## 2020-07-02 DIAGNOSIS — M6281 Muscle weakness (generalized): Secondary | ICD-10-CM | POA: Diagnosis not present

## 2020-07-03 DIAGNOSIS — I38 Endocarditis, valve unspecified: Secondary | ICD-10-CM | POA: Diagnosis not present

## 2020-07-03 DIAGNOSIS — D649 Anemia, unspecified: Secondary | ICD-10-CM | POA: Diagnosis not present

## 2020-07-03 DIAGNOSIS — T826XXA Infection and inflammatory reaction due to cardiac valve prosthesis, initial encounter: Secondary | ICD-10-CM | POA: Diagnosis not present

## 2020-07-03 DIAGNOSIS — M6281 Muscle weakness (generalized): Secondary | ICD-10-CM | POA: Diagnosis not present

## 2020-07-03 DIAGNOSIS — B952 Enterococcus as the cause of diseases classified elsewhere: Secondary | ICD-10-CM | POA: Diagnosis not present

## 2020-07-03 DIAGNOSIS — R7881 Bacteremia: Secondary | ICD-10-CM | POA: Diagnosis not present

## 2020-07-10 DIAGNOSIS — M6281 Muscle weakness (generalized): Secondary | ICD-10-CM | POA: Diagnosis not present

## 2020-07-10 DIAGNOSIS — I38 Endocarditis, valve unspecified: Secondary | ICD-10-CM | POA: Diagnosis not present

## 2020-07-10 DIAGNOSIS — D649 Anemia, unspecified: Secondary | ICD-10-CM | POA: Diagnosis not present

## 2020-07-10 DIAGNOSIS — B952 Enterococcus as the cause of diseases classified elsewhere: Secondary | ICD-10-CM | POA: Diagnosis not present

## 2020-07-10 DIAGNOSIS — T826XXA Infection and inflammatory reaction due to cardiac valve prosthesis, initial encounter: Secondary | ICD-10-CM | POA: Diagnosis not present

## 2020-07-10 DIAGNOSIS — R7881 Bacteremia: Secondary | ICD-10-CM | POA: Diagnosis not present

## 2020-07-17 DIAGNOSIS — I330 Acute and subacute infective endocarditis: Secondary | ICD-10-CM | POA: Diagnosis not present

## 2020-07-17 DIAGNOSIS — Z7901 Long term (current) use of anticoagulants: Secondary | ICD-10-CM | POA: Diagnosis not present

## 2020-07-17 DIAGNOSIS — I359 Nonrheumatic aortic valve disorder, unspecified: Secondary | ICD-10-CM | POA: Diagnosis not present

## 2020-07-17 DIAGNOSIS — I4891 Unspecified atrial fibrillation: Secondary | ICD-10-CM | POA: Diagnosis not present

## 2020-08-01 DIAGNOSIS — H35033 Hypertensive retinopathy, bilateral: Secondary | ICD-10-CM | POA: Diagnosis not present

## 2020-08-01 DIAGNOSIS — H2511 Age-related nuclear cataract, right eye: Secondary | ICD-10-CM | POA: Diagnosis not present

## 2020-08-01 DIAGNOSIS — E119 Type 2 diabetes mellitus without complications: Secondary | ICD-10-CM | POA: Diagnosis not present

## 2020-08-01 DIAGNOSIS — Z961 Presence of intraocular lens: Secondary | ICD-10-CM | POA: Diagnosis not present

## 2020-08-11 ENCOUNTER — Other Ambulatory Visit: Payer: Self-pay

## 2020-08-11 ENCOUNTER — Other Ambulatory Visit: Payer: Medicare Other

## 2020-08-11 DIAGNOSIS — Z20822 Contact with and (suspected) exposure to covid-19: Secondary | ICD-10-CM

## 2020-08-11 NOTE — Progress Notes (Signed)
lab

## 2020-08-12 LAB — SARS-COV-2, NAA 2 DAY TAT

## 2020-08-12 LAB — SPECIMEN STATUS REPORT

## 2020-08-12 LAB — NOVEL CORONAVIRUS, NAA: SARS-CoV-2, NAA: DETECTED — AB

## 2020-08-13 ENCOUNTER — Telehealth (HOSPITAL_COMMUNITY): Payer: Self-pay | Admitting: Oncology

## 2020-08-13 NOTE — Telephone Encounter (Signed)
Called to Discuss with patient about Covid symptoms and the use of regeneron, a monoclonal antibody infusion for those with mild to moderate Covid symptoms and at a high risk of hospitalization.     Pt is qualified for this infusion at the [facility] infusion center due to co-morbid conditions and/or a member of an at-risk group.     Unable to reach pt. Left message to return call  Jenny Zaneta Lightcap , AGNP-C [phone] (Infusion Center Hotline)  

## 2020-08-14 ENCOUNTER — Telehealth (HOSPITAL_COMMUNITY): Payer: Self-pay | Admitting: Nurse Practitioner

## 2020-08-14 NOTE — Telephone Encounter (Signed)
Returned call to discuss with [name] about Covid symptoms and the use of casirivimab/imdevimab, a combination monoclonal antibody infusion for those with mild to moderate Covid symptoms and at a high risk of hospitalization.     Pt is qualified for this infusion at the [facility] infusion center due to co-morbid conditions. Unable to reach. Message left.      Patient Active Problem List   Diagnosis Date Noted  . Black stools 04/01/2014  . GI bleed 04/01/2014  . Occult blood in stools 08/11/2012  . Chronic anticoagulation 08/11/2012    Alda Lea, AGPCNP-BC

## 2020-08-21 DIAGNOSIS — U071 COVID-19: Secondary | ICD-10-CM | POA: Diagnosis not present

## 2020-08-21 DIAGNOSIS — I359 Nonrheumatic aortic valve disorder, unspecified: Secondary | ICD-10-CM | POA: Diagnosis not present

## 2020-08-21 DIAGNOSIS — I4891 Unspecified atrial fibrillation: Secondary | ICD-10-CM | POA: Diagnosis not present

## 2020-08-21 DIAGNOSIS — Z7901 Long term (current) use of anticoagulants: Secondary | ICD-10-CM | POA: Diagnosis not present

## 2020-08-22 ENCOUNTER — Emergency Department (HOSPITAL_BASED_OUTPATIENT_CLINIC_OR_DEPARTMENT_OTHER): Payer: Medicare Other

## 2020-08-22 ENCOUNTER — Encounter (HOSPITAL_BASED_OUTPATIENT_CLINIC_OR_DEPARTMENT_OTHER): Payer: Self-pay

## 2020-08-22 ENCOUNTER — Other Ambulatory Visit: Payer: Self-pay

## 2020-08-22 ENCOUNTER — Emergency Department (HOSPITAL_BASED_OUTPATIENT_CLINIC_OR_DEPARTMENT_OTHER)
Admission: EM | Admit: 2020-08-22 | Discharge: 2020-08-23 | Disposition: A | Discharge status: Other Acute Inpatient Hospital | Payer: Medicare Other | Attending: Emergency Medicine | Admitting: Emergency Medicine

## 2020-08-22 DIAGNOSIS — Z8616 Personal history of COVID-19: Secondary | ICD-10-CM | POA: Diagnosis not present

## 2020-08-22 DIAGNOSIS — I517 Cardiomegaly: Secondary | ICD-10-CM | POA: Diagnosis not present

## 2020-08-22 DIAGNOSIS — I480 Paroxysmal atrial fibrillation: Secondary | ICD-10-CM | POA: Diagnosis not present

## 2020-08-22 DIAGNOSIS — R000 Tachycardia, unspecified: Secondary | ICD-10-CM | POA: Diagnosis not present

## 2020-08-22 DIAGNOSIS — I5022 Chronic systolic (congestive) heart failure: Secondary | ICD-10-CM | POA: Diagnosis not present

## 2020-08-22 DIAGNOSIS — I472 Ventricular tachycardia: Secondary | ICD-10-CM

## 2020-08-22 DIAGNOSIS — I10 Essential (primary) hypertension: Secondary | ICD-10-CM | POA: Insufficient documentation

## 2020-08-22 DIAGNOSIS — Z79899 Other long term (current) drug therapy: Secondary | ICD-10-CM | POA: Diagnosis not present

## 2020-08-22 DIAGNOSIS — Z7901 Long term (current) use of anticoagulants: Secondary | ICD-10-CM | POA: Diagnosis not present

## 2020-08-22 DIAGNOSIS — Z87891 Personal history of nicotine dependence: Secondary | ICD-10-CM | POA: Diagnosis not present

## 2020-08-22 DIAGNOSIS — I4729 Other ventricular tachycardia: Secondary | ICD-10-CM

## 2020-08-22 DIAGNOSIS — R0602 Shortness of breath: Secondary | ICD-10-CM | POA: Diagnosis not present

## 2020-08-22 DIAGNOSIS — U071 COVID-19: Secondary | ICD-10-CM | POA: Diagnosis not present

## 2020-08-22 DIAGNOSIS — J441 Chronic obstructive pulmonary disease with (acute) exacerbation: Secondary | ICD-10-CM

## 2020-08-22 LAB — MAGNESIUM: Magnesium: 2 mg/dL (ref 1.7–2.4)

## 2020-08-22 LAB — CBC WITH DIFFERENTIAL/PLATELET
Abs Immature Granulocytes: 0.05 10*3/uL (ref 0.00–0.07)
Basophils Absolute: 0 10*3/uL (ref 0.0–0.1)
Basophils Relative: 0 %
Eosinophils Absolute: 0.1 10*3/uL (ref 0.0–0.5)
Eosinophils Relative: 1 %
HCT: 39.6 % (ref 39.0–52.0)
Hemoglobin: 13.4 g/dL (ref 13.0–17.0)
Immature Granulocytes: 1 %
Lymphocytes Relative: 10 %
Lymphs Abs: 1 10*3/uL (ref 0.7–4.0)
MCH: 32.9 pg (ref 26.0–34.0)
MCHC: 33.8 g/dL (ref 30.0–36.0)
MCV: 97.3 fL (ref 80.0–100.0)
Monocytes Absolute: 0.5 10*3/uL (ref 0.1–1.0)
Monocytes Relative: 5 %
Neutro Abs: 8 10*3/uL — ABNORMAL HIGH (ref 1.7–7.7)
Neutrophils Relative %: 83 %
Platelets: 259 10*3/uL (ref 150–400)
RBC: 4.07 MIL/uL — ABNORMAL LOW (ref 4.22–5.81)
RDW: 13.9 % (ref 11.5–15.5)
WBC: 9.7 10*3/uL (ref 4.0–10.5)
nRBC: 0 % (ref 0.0–0.2)

## 2020-08-22 LAB — COMPREHENSIVE METABOLIC PANEL
ALT: 29 U/L (ref 0–44)
AST: 37 U/L (ref 15–41)
Albumin: 3.8 g/dL (ref 3.5–5.0)
Alkaline Phosphatase: 69 U/L (ref 38–126)
Anion gap: 13 (ref 5–15)
BUN: 31 mg/dL — ABNORMAL HIGH (ref 8–23)
CO2: 22 mmol/L (ref 22–32)
Calcium: 9.1 mg/dL (ref 8.9–10.3)
Chloride: 102 mmol/L (ref 98–111)
Creatinine, Ser: 1.94 mg/dL — ABNORMAL HIGH (ref 0.61–1.24)
GFR calc Af Amer: 35 mL/min — ABNORMAL LOW (ref >60)
GFR calc non Af Amer: 30 mL/min — ABNORMAL LOW (ref >60)
Glucose, Bld: 152 mg/dL — ABNORMAL HIGH (ref 70–99)
Potassium: 3.7 mmol/L (ref 3.5–5.1)
Sodium: 137 mmol/L (ref 135–145)
Total Bilirubin: 0.7 mg/dL (ref 0.3–1.2)
Total Protein: 7.5 g/dL (ref 6.5–8.1)

## 2020-08-22 LAB — TROPONIN I (HIGH SENSITIVITY)
Troponin I (High Sensitivity): 17 ng/L (ref <18)
Troponin I (High Sensitivity): 27 ng/L — ABNORMAL HIGH (ref <18)

## 2020-08-22 MED ORDER — AMIODARONE HCL IN DEXTROSE 360-4.14 MG/200ML-% IV SOLN: 30.0000 mg/h | INTRAVENOUS | Status: DC

## 2020-08-22 MED ORDER — PROCAINAMIDE HCL 100 MG/ML IJ SOLN
50.0000 mg/min | INTRAVENOUS | Status: AC
  Filled 2020-08-22: qty 10

## 2020-08-22 MED ORDER — AMIODARONE HCL IN DEXTROSE 360-4.14 MG/200ML-% IV SOLN: 60.0000 mg/h | INTRAVENOUS | Status: DC

## 2020-08-22 MED ORDER — MAGNESIUM SULFATE 2 GM/50ML IV SOLN
2.0000 g | Freq: Once | INTRAVENOUS | Status: AC
  Administered 2020-08-22: 2 g via INTRAVENOUS
  Filled 2020-08-22: qty 50

## 2020-08-22 MED ORDER — PROCAINAMIDE HCL 100 MG/ML IJ SOLN
50.0000 mg/min | INTRAVENOUS | Status: AC
  Filled 2020-08-22 (×2): qty 10

## 2020-08-22 MED ORDER — AMIODARONE LOAD VIA INFUSION
150.0000 mg | Freq: Once | INTRAVENOUS | Status: DC
  Filled 2020-08-22: qty 83.34

## 2020-08-22 MED ORDER — METOPROLOL TARTRATE 5 MG/5ML IV SOLN
5.0000 mg | Freq: Once | INTRAVENOUS | Status: AC
  Administered 2020-08-22: 5 mg via INTRAVENOUS
  Filled 2020-08-22: qty 5

## 2020-08-22 NOTE — ED Notes (Signed)
This Probation officer called [facility] for transport @ 2054

## 2020-08-22 NOTE — ED Triage Notes (Signed)
Pt reports low HR, elevated "coumadin level", SOB-reports recent "endocarditis" per son-pt with slow gait using own cane

## 2020-08-22 NOTE — ED Notes (Signed)
ED Provider at bedside. 

## 2020-08-22 NOTE — ED Notes (Addendum)
Spoke with [facility], has been accepted but is awaiting a bed assignment

## 2020-08-22 NOTE — ED Notes (Signed)
Portable Xray at bedside.

## 2020-08-22 NOTE — ED Notes (Signed)
INR 4.5, results given to Dr Tyrone Nine and Orlando Penner RN

## 2020-08-22 NOTE — ED Notes (Signed)
Informed by secretary that patient's heart rate is 160.  Went to the room and noted that heart heart was 160, wide QRS.  Another nurse, Ms. Marinus Maw was already in the room.  Called EDP to room.  EDP at bedside talking to the patient and son.  While we were conversing, patient's converted to BBB.  Patient denies of any pain or discomfort this entire time.

## 2020-08-22 NOTE — ED Provider Notes (Signed)
[facility] EMERGENCY DEPARTMENT Provider Note   CSN: [id number] Arrival date & time: 08/22/20  1333     History Chief Complaint  Patient presents with  . Shortness of Breath    Steve Gorka. is a 86 y.o. male.  86 yo M with a chief complaints of shortness of breath.  Going on for about 3 or 4 days.  Was recently diagnosed with a coronavirus and thought he was getting over edge and then started having recurrent symptoms.  Also was diagnosed with pericarditis and started on antibiotics due to bacteremia.  Has just finished his therapy from that.  Complaining mostly of shortness of breath fatigue and palpitations.  Denies any chest pain or pressure.  The history is provided by the patient.  Shortness of Breath Severity:  Moderate Onset quality:  Gradual Duration:  3 days Timing:  Constant Progression:  Worsening Chronicity:  New Relieved by:  Nothing Worsened by:  Nothing Ineffective treatments:  None tried Associated symptoms: cough   Associated symptoms: no abdominal pain, no chest pain, no fever, no headaches, no rash and no vomiting        Past Medical History:  Diagnosis Date  . Anemia   . Arthritis   . Diverticulosis   . Endocarditis   . GI bleed   . Gout   . Heart murmur   . Hx of adenomatous colonic polyps   . Hyperlipemia   . Hyperparathyroidism ([place])   . Hypertension   . Nephrosclerosis     Patient Active Problem List   Diagnosis Date Noted  . Black stools 04/01/2014  . GI bleed 04/01/2014  . Occult blood in stools 08/11/2012  . Chronic anticoagulation 08/11/2012    Past Surgical History:  Procedure Laterality Date  . AORTIC AND MITRAL VALVE REPLACEMENT    . CATARACT EXTRACTION Left    with lens implant  . CORONARY ARTERY BYPASS GRAFT    . MITRAL VALVE REPLACEMENT    . ORIF ARM         Family History  Problem Relation Age of Onset  . Hypertension Mother   . Stroke Mother   . Hypertension Father   . Breast cancer Daughter    . Uterine cancer Daughter   . Liver cancer Brother 11    Social History   Tobacco Use  . Smoking status: Former Research scientist (life sciences)  . Smokeless tobacco: Never Used  Substance Use Topics  . Alcohol use: No  . Drug use: No    Home Medications Prior to Admission medications   Medication Sig Start Date End Date Taking? Authorizing Provider  acetaminophen (TYLENOL) 650 MG CR tablet Take 650 mg by mouth every 8 (eight) hours as needed.    [provider]  allopurinol (ZYLOPRIM) 100 MG tablet Take 400 mg by mouth daily.    [provider]  bumetanide (BUMEX) 2 MG tablet Take 2 tablets by mouth in the morning and 1 tablet in the evening    [provider]  calcitRIOL (ROCALTROL) 0.5 MCG capsule Take one tablet by mouth daily 08/10/12   [provider]  cetirizine (ZYRTEC) 10 MG tablet Take 10 mg by mouth daily.    [provider]  diphenhydrAMINE (BENADRYL) 25 MG tablet Take 25 mg by mouth at bedtime as needed. When working with Loss adjuster, chartered, Historical, MD  EPINEPHrine (EPIPEN JR) 0.15 MG/0.3ML injection Inject 0.15 mg into the muscle as needed.    [provider]  fish oil-omega-3  fatty acids 1000 MG capsule Take 1 g by mouth daily.    [provider]  gemfibrozil (LOPID) 600 MG tablet Take 600 mg by mouth 2 (two) times daily.    [provider]  glucosamine-chondroitin 500-400 MG tablet Take 1 tablet by mouth daily.     [provider]  metoprolol tartrate (LOPRESSOR) 25 MG tablet Take 25 mg by mouth 2 (two) times daily.  08/03/12   [provider]  Multiple Vitamins-Minerals (MULTIVITAMIN WITH MINERALS) tablet Take 1 tablet by mouth daily.    [provider]  omeprazole (PRILOSEC) 20 MG capsule Take 1 capsule (20 mg total) by mouth daily. 06/20/15   Lafayette Dragon, MD  potassium chloride SA (K-DUR,KLOR-CON) 20 MEQ tablet Take 20 mEq by mouth 2 (two) times daily.    [provider]    pravastatin (PRAVACHOL) 40 MG tablet Take 80 mg by mouth daily.     [provider]  tamsulosin (FLOMAX) 0.4 MG CAPS capsule Take 0.4 mg by mouth daily.    [provider]  warfarin (COUMADIN) 5 MG tablet Take 5 mg by mouth daily.     [provider]    Allergies    Bee venom and Ace inhibitors  Review of Systems   Review of Systems  Constitutional: Negative for chills and fever.  HENT: Negative for congestion and facial swelling.   Eyes: Negative for discharge and visual disturbance.  Respiratory: Positive for cough and shortness of breath.   Cardiovascular: Negative for chest pain and palpitations.  Gastrointestinal: Negative for abdominal pain, diarrhea and vomiting.  Musculoskeletal: Negative for arthralgias and myalgias.  Skin: Negative for color change and rash.  Neurological: Negative for tremors, syncope and headaches.  Psychiatric/Behavioral: Negative for confusion and dysphoric mood.    Physical Exam Updated Vital Signs BP 108/78 (BP Location: Right Arm)   Pulse 96   Temp 98.1 F (36.7 C)   Resp 19   Ht 5\' 4"  (1.626 m)   Wt 88.5 kg   SpO2 100%   BMI 33.47 kg/m   Physical Exam Vitals and nursing note reviewed.  Constitutional:      Appearance: He is well-developed.  HENT:     Head: Normocephalic and atraumatic.  Eyes:     Pupils: Pupils are equal, round, and reactive to light.  Neck:     Vascular: No JVD.  Cardiovascular:     Rate and Rhythm: Normal rate and regular rhythm.     Heart sounds: No murmur heard.  No friction rub. No gallop.   Pulmonary:     Effort: No respiratory distress.     Breath sounds: No wheezing.  Abdominal:     General: There is no distension.     Tenderness: There is no guarding or rebound.  Musculoskeletal:        General: Normal range of motion.     Cervical back: Normal range of motion and neck supple.  Skin:    Coloration: Skin is not pale.     Findings: No rash.  Neurological:     Mental  Status: He is alert and oriented to person, place, and time.  Psychiatric:        Behavior: Behavior normal.     ED Results / Procedures / Treatments   Labs (all labs ordered are listed, but only abnormal results are displayed) Labs Reviewed  CBC WITH DIFFERENTIAL/PLATELET - Abnormal; Notable for the following components:      Result Value  RBC 4.07 (*)    Neutro Abs 8.0 (*)    All other components within normal limits  COMPREHENSIVE METABOLIC PANEL - Abnormal; Notable for the following components:   Glucose, Bld 152 (*)    BUN 31 (*)    Creatinine, Ser 1.94 (*)    GFR calc non Af Amer 30 (*)    GFR calc Af Amer 35 (*)    All other components within normal limits  PROTIME-INR - Abnormal; Notable for the following components:   Prothrombin Time 41.3 (*)    INR 4.5 (*)    All other components within normal limits  CULTURE, BLOOD (ROUTINE X 2)  CULTURE, BLOOD (ROUTINE X 2)  MAGNESIUM  TROPONIN I (HIGH SENSITIVITY)    EKG EKG Interpretation  Date/Time:  Tuesday August 22 2020 14:08:03 EDT Ventricular Rate:  100 PR Interval:    QRS Duration: 109 QT Interval:  339 QTC Calculation: 438 R Axis:   63 Text Interpretation: Atrial flutter with predominant 2:1 AV block Incomplete left bundle branch block Borderline low voltage, extremity leads Rate slower Confirmed by Steve Riddle [id number]) on 08/22/2020 2:34:52 PM   Radiology No results found.  Procedures Procedures (including critical care time)  Medications Ordered in ED Medications  magnesium sulfate IVPB 2 g 50 mL (2 g Intravenous New Bag/Given 08/22/20 1434)  metoprolol tartrate (LOPRESSOR) injection 5 mg (5 mg Intravenous Given 08/22/20 1430)    ED Course  I have reviewed the triage vital signs and the nursing notes.  Pertinent labs & imaging results that were available during my care of the patient were reviewed by me and considered in my medical decision making (see chart for details).    MDM  Rules/Calculators/A&P                          86 yo M with a chief complaints of shortness of breath.  Going on for about 4 days.  Was diagnosed with novel coronavirus about 10 days ago.  Patient unfortunately was recently diagnosed with endocarditis and had finished antibiotic therapy with.  He had called his cardiologist today who suggested he come to the ED for evaluation.  Upon arrival the patient was in a wide-complex tachycardia.  Concerning for V. tach though the patient had no diaphoresis and maintained the blood pressure.  Was actually able to ambulate back to his bed.  He denies any pain with this.  Will obtain lab work.  Discussed with his cardiologist.  As I was discussing the plan with him he went into a much slower rhythm.   I discussed the case with the patient's primary cardiologist, Dr. Wadie Lessen.  He also evaluate the patient's EKGs and thinks that his initial rhythm was consistent with ventricular tachycardia.  Feel the patient likely needs to come into the hospital.  We will try to transfer to [facility].  CRITICAL CARE Performed by: Cecilio Asper   Total critical care time: 35 minutes  Critical care time was exclusive of separately billable procedures and treating other patients.  Critical care was necessary to treat or prevent imminent or life-threatening deterioration.  Critical care was time spent personally by me on the following activities: development of treatment plan with patient and/or surrogate as well as nursing, discussions with consultants, evaluation of patient's response to treatment, examination of patient, obtaining history from patient or surrogate, ordering and performing treatments and interventions, ordering and review of laboratory studies, ordering and review of radiographic  studies, pulse oximetry and re-evaluation of patient's condition.  The patients results and plan were reviewed and discussed.   Any x-rays performed were independently reviewed by  myself.   Differential diagnosis were considered with the presenting HPI.  Medications  magnesium sulfate IVPB 2 g 50 mL (2 g Intravenous New Bag/Given 08/22/20 1434)  metoprolol tartrate (LOPRESSOR) injection 5 mg (5 mg Intravenous Given 08/22/20 1430)    Vitals:   08/22/20 1342 08/22/20 1345 08/22/20 1405 08/22/20 1518  BP: (!) 119/95   108/78  Pulse:   80 96  Resp: (!) 28  (!) 22 19  Temp: 98.1 F (36.7 C)     SpO2: 98%  99% 100%  Weight:  88.5 kg    Height:  5\' 4"  (1.626 m)      Final diagnoses:  Ventricular tachycardia ([place])    Admission/ observation were discussed with the admitting physician, patient and/or family and they are comfortable with the plan.   Final Clinical Impression(s) / ED Diagnoses Final diagnoses:  Ventricular tachycardia [facility])    Rx / DC Orders ED Discharge Orders    None       Steve Etienne, DO 08/22/20 1527

## 2020-08-23 DIAGNOSIS — I340 Nonrheumatic mitral (valve) insufficiency: Secondary | ICD-10-CM | POA: Diagnosis not present

## 2020-08-23 DIAGNOSIS — I483 Typical atrial flutter: Secondary | ICD-10-CM | POA: Diagnosis not present

## 2020-08-23 DIAGNOSIS — J1282 Pneumonia due to coronavirus disease 2019: Secondary | ICD-10-CM | POA: Diagnosis not present

## 2020-08-23 DIAGNOSIS — I2510 Atherosclerotic heart disease of native coronary artery without angina pectoris: Secondary | ICD-10-CM | POA: Diagnosis present

## 2020-08-23 DIAGNOSIS — Z87891 Personal history of nicotine dependence: Secondary | ICD-10-CM | POA: Diagnosis not present

## 2020-08-23 DIAGNOSIS — J22 Unspecified acute lower respiratory infection: Secondary | ICD-10-CM | POA: Diagnosis not present

## 2020-08-23 DIAGNOSIS — M1A9XX0 Chronic gout, unspecified, without tophus (tophi): Secondary | ICD-10-CM | POA: Diagnosis present

## 2020-08-23 DIAGNOSIS — I4891 Unspecified atrial fibrillation: Secondary | ICD-10-CM | POA: Diagnosis present

## 2020-08-23 DIAGNOSIS — U071 COVID-19: Secondary | ICD-10-CM | POA: Diagnosis present

## 2020-08-23 DIAGNOSIS — E785 Hyperlipidemia, unspecified: Secondary | ICD-10-CM | POA: Diagnosis present

## 2020-08-23 DIAGNOSIS — Z79899 Other long term (current) drug therapy: Secondary | ICD-10-CM | POA: Diagnosis not present

## 2020-08-23 DIAGNOSIS — I472 Ventricular tachycardia: Secondary | ICD-10-CM | POA: Diagnosis present

## 2020-08-23 DIAGNOSIS — N183 Chronic kidney disease, stage 3 unspecified: Secondary | ICD-10-CM | POA: Diagnosis not present

## 2020-08-23 DIAGNOSIS — I480 Paroxysmal atrial fibrillation: Secondary | ICD-10-CM | POA: Diagnosis not present

## 2020-08-23 DIAGNOSIS — N1832 Chronic kidney disease, stage 3b: Secondary | ICD-10-CM | POA: Diagnosis present

## 2020-08-23 DIAGNOSIS — N179 Acute kidney failure, unspecified: Secondary | ICD-10-CM | POA: Diagnosis not present

## 2020-08-23 DIAGNOSIS — I959 Hypotension, unspecified: Secondary | ICD-10-CM | POA: Diagnosis not present

## 2020-08-23 DIAGNOSIS — I4729 Other ventricular tachycardia: Secondary | ICD-10-CM | POA: Diagnosis present

## 2020-08-23 DIAGNOSIS — Z8679 Personal history of other diseases of the circulatory system: Secondary | ICD-10-CM | POA: Diagnosis not present

## 2020-08-23 DIAGNOSIS — R918 Other nonspecific abnormal finding of lung field: Secondary | ICD-10-CM | POA: Diagnosis not present

## 2020-08-23 DIAGNOSIS — Z66 Do not resuscitate: Secondary | ICD-10-CM | POA: Diagnosis not present

## 2020-08-23 DIAGNOSIS — I5022 Chronic systolic (congestive) heart failure: Secondary | ICD-10-CM | POA: Diagnosis not present

## 2020-08-23 DIAGNOSIS — I130 Hypertensive heart and chronic kidney disease with heart failure and stage 1 through stage 4 chronic kidney disease, or unspecified chronic kidney disease: Secondary | ICD-10-CM | POA: Diagnosis present

## 2020-08-23 DIAGNOSIS — I10 Essential (primary) hypertension: Secondary | ICD-10-CM | POA: Diagnosis not present

## 2020-08-23 DIAGNOSIS — R002 Palpitations: Secondary | ICD-10-CM | POA: Diagnosis not present

## 2020-08-23 DIAGNOSIS — Z7901 Long term (current) use of anticoagulants: Secondary | ICD-10-CM | POA: Diagnosis not present

## 2020-08-23 DIAGNOSIS — I517 Cardiomegaly: Secondary | ICD-10-CM | POA: Diagnosis not present

## 2020-08-23 DIAGNOSIS — I509 Heart failure, unspecified: Secondary | ICD-10-CM | POA: Diagnosis not present

## 2020-08-23 DIAGNOSIS — I5042 Chronic combined systolic (congestive) and diastolic (congestive) heart failure: Secondary | ICD-10-CM | POA: Diagnosis present

## 2020-08-23 DIAGNOSIS — R791 Abnormal coagulation profile: Secondary | ICD-10-CM | POA: Diagnosis present

## 2020-08-23 DIAGNOSIS — B957 Other staphylococcus as the cause of diseases classified elsewhere: Secondary | ICD-10-CM | POA: Diagnosis not present

## 2020-08-23 DIAGNOSIS — Z952 Presence of prosthetic heart valve: Secondary | ICD-10-CM | POA: Diagnosis not present

## 2020-08-23 DIAGNOSIS — R7881 Bacteremia: Secondary | ICD-10-CM | POA: Diagnosis not present

## 2020-08-23 DIAGNOSIS — I4892 Unspecified atrial flutter: Secondary | ICD-10-CM | POA: Diagnosis present

## 2020-08-23 LAB — PROTIME-INR
INR: 4.5 (ref 0.8–1.2)
Prothrombin Time: 41.3 seconds — ABNORMAL HIGH (ref 11.4–15.2)

## 2020-08-24 LAB — BLOOD CULTURE ID PANEL (REFLEXED) - BCID2

## 2020-08-24 NOTE — ED Provider Notes (Signed)
I received this patient in signout from Dr. Tyrone Nine.  Briefly, patient has history of atrial fibrillation and valve replacement on anticoagulation presenting with some shortness of breath.  Noted to have intermittent episodes of V. tach in the ED, brief and self-limited, patient alert and stable during episodes.  At time of signout, Dr. Tyrone Nine had discussed case with [facility] where patient follows and pending [facility] transfer.  I spoke 3 times with [facility] over the course of several hours and patient's bed has been confirmed. He had a few more runs of V tach. Discussed w/ [facility] cardiologist who did not feel comfortable making medication recommendations over the phone. I started to order amiodarone but was told patient currently taking amiodarone daily. Ordered procainamide which had to be couriered over from main hospital.  By the time it arrived, patient no longer having intermittent runs of V. tach and has remained in sinus rhythm. Will hold off on drip for now. Pt transferred to [facility] in guarded condition.  CRITICAL CARE Performed by: Wenda Overland Karlisa Gaubert   Total critical care time: 30 minutes  Critical care time was exclusive of separately billable procedures and treating other patients.  Critical care was necessary to treat or prevent imminent or life-threatening deterioration.  Critical care was time spent personally by me on the following activities: development of treatment plan with patient and/or surrogate as well as nursing, discussions with consultants, evaluation of patient's response to treatment, examination of patient, obtaining history from patient or surrogate, ordering and performing treatments and interventions, ordering and review of laboratory studies, ordering and review of radiographic studies, pulse oximetry and re-evaluation of patient's condition.    Jaspreet Hollings, Wenda Overland, MD 08/24/20 [phone]

## 2020-08-26 LAB — CULTURE, BLOOD (ROUTINE X 2): Special Requests: ADEQUATE

## 2020-08-27 ENCOUNTER — Telehealth: Payer: Self-pay

## 2020-08-27 LAB — CULTURE, BLOOD (ROUTINE X 2)
Culture: NO GROWTH
Special Requests: ADEQUATE

## 2020-08-27 NOTE — Telephone Encounter (Signed)
Pt with + [facility] ED 08/23/20 Admitted at [facility] and they are aware per Judee Clara D

## 2020-08-31 DIAGNOSIS — M6281 Muscle weakness (generalized): Secondary | ICD-10-CM | POA: Diagnosis not present

## 2020-08-31 DIAGNOSIS — Z5181 Encounter for therapeutic drug level monitoring: Secondary | ICD-10-CM | POA: Diagnosis not present

## 2020-08-31 DIAGNOSIS — Z9981 Dependence on supplemental oxygen: Secondary | ICD-10-CM | POA: Diagnosis not present

## 2020-08-31 DIAGNOSIS — Z7901 Long term (current) use of anticoagulants: Secondary | ICD-10-CM | POA: Diagnosis not present

## 2020-08-31 DIAGNOSIS — I5032 Chronic diastolic (congestive) heart failure: Secondary | ICD-10-CM | POA: Diagnosis not present

## 2020-08-31 DIAGNOSIS — I2510 Atherosclerotic heart disease of native coronary artery without angina pectoris: Secondary | ICD-10-CM | POA: Diagnosis not present

## 2020-08-31 DIAGNOSIS — Z952 Presence of prosthetic heart valve: Secondary | ICD-10-CM | POA: Diagnosis not present

## 2020-08-31 DIAGNOSIS — I130 Hypertensive heart and chronic kidney disease with heart failure and stage 1 through stage 4 chronic kidney disease, or unspecified chronic kidney disease: Secondary | ICD-10-CM | POA: Diagnosis not present

## 2020-08-31 DIAGNOSIS — U071 COVID-19: Secondary | ICD-10-CM | POA: Diagnosis not present

## 2020-08-31 DIAGNOSIS — I472 Ventricular tachycardia: Secondary | ICD-10-CM | POA: Diagnosis not present

## 2020-09-01 DIAGNOSIS — Z7901 Long term (current) use of anticoagulants: Secondary | ICD-10-CM | POA: Diagnosis not present

## 2020-09-01 DIAGNOSIS — I4891 Unspecified atrial fibrillation: Secondary | ICD-10-CM | POA: Diagnosis not present

## 2020-09-01 DIAGNOSIS — I129 Hypertensive chronic kidney disease with stage 1 through stage 4 chronic kidney disease, or unspecified chronic kidney disease: Secondary | ICD-10-CM | POA: Diagnosis not present

## 2020-09-01 DIAGNOSIS — N1831 Chronic kidney disease, stage 3a: Secondary | ICD-10-CM | POA: Diagnosis not present

## 2020-09-01 DIAGNOSIS — U071 COVID-19: Secondary | ICD-10-CM | POA: Diagnosis not present

## 2020-09-05 DIAGNOSIS — I130 Hypertensive heart and chronic kidney disease with heart failure and stage 1 through stage 4 chronic kidney disease, or unspecified chronic kidney disease: Secondary | ICD-10-CM | POA: Diagnosis not present

## 2020-09-05 DIAGNOSIS — U071 COVID-19: Secondary | ICD-10-CM | POA: Diagnosis not present

## 2020-09-05 DIAGNOSIS — M6281 Muscle weakness (generalized): Secondary | ICD-10-CM | POA: Diagnosis not present

## 2020-09-05 DIAGNOSIS — I2510 Atherosclerotic heart disease of native coronary artery without angina pectoris: Secondary | ICD-10-CM | POA: Diagnosis not present

## 2020-09-05 DIAGNOSIS — I5032 Chronic diastolic (congestive) heart failure: Secondary | ICD-10-CM | POA: Diagnosis not present

## 2020-09-05 DIAGNOSIS — I472 Ventricular tachycardia: Secondary | ICD-10-CM | POA: Diagnosis not present

## 2020-09-08 DIAGNOSIS — I5032 Chronic diastolic (congestive) heart failure: Secondary | ICD-10-CM | POA: Diagnosis not present

## 2020-09-08 DIAGNOSIS — M6281 Muscle weakness (generalized): Secondary | ICD-10-CM | POA: Diagnosis not present

## 2020-09-08 DIAGNOSIS — I2510 Atherosclerotic heart disease of native coronary artery without angina pectoris: Secondary | ICD-10-CM | POA: Diagnosis not present

## 2020-09-08 DIAGNOSIS — U071 COVID-19: Secondary | ICD-10-CM | POA: Diagnosis not present

## 2020-09-08 DIAGNOSIS — I130 Hypertensive heart and chronic kidney disease with heart failure and stage 1 through stage 4 chronic kidney disease, or unspecified chronic kidney disease: Secondary | ICD-10-CM | POA: Diagnosis not present

## 2020-09-08 DIAGNOSIS — I472 Ventricular tachycardia: Secondary | ICD-10-CM | POA: Diagnosis not present

## 2020-09-11 DIAGNOSIS — U071 COVID-19: Secondary | ICD-10-CM | POA: Diagnosis not present

## 2020-09-11 DIAGNOSIS — M6281 Muscle weakness (generalized): Secondary | ICD-10-CM | POA: Diagnosis not present

## 2020-09-11 DIAGNOSIS — I5032 Chronic diastolic (congestive) heart failure: Secondary | ICD-10-CM | POA: Diagnosis not present

## 2020-09-11 DIAGNOSIS — I130 Hypertensive heart and chronic kidney disease with heart failure and stage 1 through stage 4 chronic kidney disease, or unspecified chronic kidney disease: Secondary | ICD-10-CM | POA: Diagnosis not present

## 2020-09-11 DIAGNOSIS — I2510 Atherosclerotic heart disease of native coronary artery without angina pectoris: Secondary | ICD-10-CM | POA: Diagnosis not present

## 2020-09-11 DIAGNOSIS — I472 Ventricular tachycardia: Secondary | ICD-10-CM | POA: Diagnosis not present

## 2020-09-13 DIAGNOSIS — I130 Hypertensive heart and chronic kidney disease with heart failure and stage 1 through stage 4 chronic kidney disease, or unspecified chronic kidney disease: Secondary | ICD-10-CM | POA: Diagnosis not present

## 2020-09-13 DIAGNOSIS — U071 COVID-19: Secondary | ICD-10-CM | POA: Diagnosis not present

## 2020-09-13 DIAGNOSIS — I5032 Chronic diastolic (congestive) heart failure: Secondary | ICD-10-CM | POA: Diagnosis not present

## 2020-09-13 DIAGNOSIS — M6281 Muscle weakness (generalized): Secondary | ICD-10-CM | POA: Diagnosis not present

## 2020-09-13 DIAGNOSIS — I2510 Atherosclerotic heart disease of native coronary artery without angina pectoris: Secondary | ICD-10-CM | POA: Diagnosis not present

## 2020-09-13 DIAGNOSIS — I472 Ventricular tachycardia: Secondary | ICD-10-CM | POA: Diagnosis not present

## 2020-09-18 DIAGNOSIS — I348 Other nonrheumatic mitral valve disorders: Secondary | ICD-10-CM | POA: Diagnosis not present

## 2020-09-18 DIAGNOSIS — I4891 Unspecified atrial fibrillation: Secondary | ICD-10-CM | POA: Diagnosis not present

## 2020-09-18 DIAGNOSIS — Z952 Presence of prosthetic heart valve: Secondary | ICD-10-CM | POA: Diagnosis not present

## 2020-09-18 DIAGNOSIS — E669 Obesity, unspecified: Secondary | ICD-10-CM | POA: Diagnosis not present

## 2020-09-18 DIAGNOSIS — I472 Ventricular tachycardia: Secondary | ICD-10-CM | POA: Diagnosis not present

## 2020-09-18 DIAGNOSIS — N1831 Chronic kidney disease, stage 3a: Secondary | ICD-10-CM | POA: Diagnosis not present

## 2020-09-18 DIAGNOSIS — Z7901 Long term (current) use of anticoagulants: Secondary | ICD-10-CM | POA: Diagnosis not present

## 2020-09-18 DIAGNOSIS — I129 Hypertensive chronic kidney disease with stage 1 through stage 4 chronic kidney disease, or unspecified chronic kidney disease: Secondary | ICD-10-CM | POA: Diagnosis not present

## 2020-09-18 DIAGNOSIS — N320 Bladder-neck obstruction: Secondary | ICD-10-CM | POA: Diagnosis not present

## 2020-09-18 DIAGNOSIS — E785 Hyperlipidemia, unspecified: Secondary | ICD-10-CM | POA: Diagnosis not present

## 2020-09-18 DIAGNOSIS — R7301 Impaired fasting glucose: Secondary | ICD-10-CM | POA: Diagnosis not present

## 2020-09-18 DIAGNOSIS — M109 Gout, unspecified: Secondary | ICD-10-CM | POA: Diagnosis not present

## 2020-09-20 DIAGNOSIS — I2510 Atherosclerotic heart disease of native coronary artery without angina pectoris: Secondary | ICD-10-CM | POA: Diagnosis not present

## 2020-09-20 DIAGNOSIS — U071 COVID-19: Secondary | ICD-10-CM | POA: Diagnosis not present

## 2020-09-20 DIAGNOSIS — I5032 Chronic diastolic (congestive) heart failure: Secondary | ICD-10-CM | POA: Diagnosis not present

## 2020-09-20 DIAGNOSIS — M6281 Muscle weakness (generalized): Secondary | ICD-10-CM | POA: Diagnosis not present

## 2020-09-20 DIAGNOSIS — I472 Ventricular tachycardia: Secondary | ICD-10-CM | POA: Diagnosis not present

## 2020-09-20 DIAGNOSIS — I130 Hypertensive heart and chronic kidney disease with heart failure and stage 1 through stage 4 chronic kidney disease, or unspecified chronic kidney disease: Secondary | ICD-10-CM | POA: Diagnosis not present

## 2020-09-22 DIAGNOSIS — I5032 Chronic diastolic (congestive) heart failure: Secondary | ICD-10-CM | POA: Diagnosis not present

## 2020-09-22 DIAGNOSIS — U071 COVID-19: Secondary | ICD-10-CM | POA: Diagnosis not present

## 2020-09-22 DIAGNOSIS — M6281 Muscle weakness (generalized): Secondary | ICD-10-CM | POA: Diagnosis not present

## 2020-09-22 DIAGNOSIS — I2510 Atherosclerotic heart disease of native coronary artery without angina pectoris: Secondary | ICD-10-CM | POA: Diagnosis not present

## 2020-09-22 DIAGNOSIS — I472 Ventricular tachycardia: Secondary | ICD-10-CM | POA: Diagnosis not present

## 2020-09-22 DIAGNOSIS — I130 Hypertensive heart and chronic kidney disease with heart failure and stage 1 through stage 4 chronic kidney disease, or unspecified chronic kidney disease: Secondary | ICD-10-CM | POA: Diagnosis not present

## 2020-09-25 DIAGNOSIS — I472 Ventricular tachycardia: Secondary | ICD-10-CM | POA: Diagnosis not present

## 2020-09-26 DIAGNOSIS — I2510 Atherosclerotic heart disease of native coronary artery without angina pectoris: Secondary | ICD-10-CM | POA: Diagnosis not present

## 2020-09-26 DIAGNOSIS — I130 Hypertensive heart and chronic kidney disease with heart failure and stage 1 through stage 4 chronic kidney disease, or unspecified chronic kidney disease: Secondary | ICD-10-CM | POA: Diagnosis not present

## 2020-09-26 DIAGNOSIS — I5032 Chronic diastolic (congestive) heart failure: Secondary | ICD-10-CM | POA: Diagnosis not present

## 2020-09-26 DIAGNOSIS — I472 Ventricular tachycardia: Secondary | ICD-10-CM | POA: Diagnosis not present

## 2020-09-26 DIAGNOSIS — U071 COVID-19: Secondary | ICD-10-CM | POA: Diagnosis not present

## 2020-09-26 DIAGNOSIS — M6281 Muscle weakness (generalized): Secondary | ICD-10-CM | POA: Diagnosis not present

## 2020-10-02 DIAGNOSIS — Z7901 Long term (current) use of anticoagulants: Secondary | ICD-10-CM | POA: Diagnosis not present

## 2020-10-02 DIAGNOSIS — I4891 Unspecified atrial fibrillation: Secondary | ICD-10-CM | POA: Diagnosis not present

## 2020-10-02 DIAGNOSIS — I359 Nonrheumatic aortic valve disorder, unspecified: Secondary | ICD-10-CM | POA: Diagnosis not present

## 2020-10-04 DIAGNOSIS — Z79899 Other long term (current) drug therapy: Secondary | ICD-10-CM | POA: Diagnosis not present

## 2020-10-04 DIAGNOSIS — Z952 Presence of prosthetic heart valve: Secondary | ICD-10-CM | POA: Diagnosis not present

## 2020-10-04 DIAGNOSIS — I2510 Atherosclerotic heart disease of native coronary artery without angina pectoris: Secondary | ICD-10-CM | POA: Diagnosis not present

## 2020-10-04 DIAGNOSIS — Z23 Encounter for immunization: Secondary | ICD-10-CM | POA: Diagnosis not present

## 2020-10-04 DIAGNOSIS — E7800 Pure hypercholesterolemia, unspecified: Secondary | ICD-10-CM | POA: Diagnosis not present

## 2020-10-04 DIAGNOSIS — Z5181 Encounter for therapeutic drug level monitoring: Secondary | ICD-10-CM | POA: Diagnosis not present

## 2020-10-04 DIAGNOSIS — I483 Typical atrial flutter: Secondary | ICD-10-CM | POA: Diagnosis not present

## 2020-10-04 DIAGNOSIS — I10 Essential (primary) hypertension: Secondary | ICD-10-CM | POA: Diagnosis not present

## 2020-10-04 DIAGNOSIS — I472 Ventricular tachycardia: Secondary | ICD-10-CM | POA: Diagnosis not present

## 2020-10-11 DIAGNOSIS — Z7901 Long term (current) use of anticoagulants: Secondary | ICD-10-CM | POA: Diagnosis not present

## 2020-10-11 DIAGNOSIS — I4891 Unspecified atrial fibrillation: Secondary | ICD-10-CM | POA: Diagnosis not present

## 2020-10-11 DIAGNOSIS — I348 Other nonrheumatic mitral valve disorders: Secondary | ICD-10-CM | POA: Diagnosis not present

## 2020-10-13 DIAGNOSIS — I472 Ventricular tachycardia: Secondary | ICD-10-CM | POA: Diagnosis not present

## 2020-10-25 DIAGNOSIS — I4891 Unspecified atrial fibrillation: Secondary | ICD-10-CM | POA: Diagnosis not present

## 2020-10-25 DIAGNOSIS — I10 Essential (primary) hypertension: Secondary | ICD-10-CM | POA: Diagnosis not present

## 2020-10-25 DIAGNOSIS — M1A00X0 Idiopathic chronic gout, unspecified site, without tophus (tophi): Secondary | ICD-10-CM | POA: Diagnosis not present

## 2020-10-25 DIAGNOSIS — E785 Hyperlipidemia, unspecified: Secondary | ICD-10-CM | POA: Diagnosis not present

## 2020-10-25 DIAGNOSIS — I484 Atypical atrial flutter: Secondary | ICD-10-CM | POA: Diagnosis not present

## 2020-10-25 DIAGNOSIS — N189 Chronic kidney disease, unspecified: Secondary | ICD-10-CM | POA: Diagnosis not present

## 2020-10-25 DIAGNOSIS — M1A9XX0 Chronic gout, unspecified, without tophus (tophi): Secondary | ICD-10-CM | POA: Diagnosis not present

## 2020-10-25 DIAGNOSIS — Z952 Presence of prosthetic heart valve: Secondary | ICD-10-CM | POA: Diagnosis not present

## 2020-10-25 DIAGNOSIS — I130 Hypertensive heart and chronic kidney disease with heart failure and stage 1 through stage 4 chronic kidney disease, or unspecified chronic kidney disease: Secondary | ICD-10-CM | POA: Diagnosis not present

## 2020-10-25 DIAGNOSIS — Z7901 Long term (current) use of anticoagulants: Secondary | ICD-10-CM | POA: Diagnosis not present

## 2020-10-25 DIAGNOSIS — I4589 Other specified conduction disorders: Secondary | ICD-10-CM | POA: Diagnosis not present

## 2020-10-25 DIAGNOSIS — I472 Ventricular tachycardia: Secondary | ICD-10-CM | POA: Diagnosis not present

## 2020-10-25 DIAGNOSIS — I4892 Unspecified atrial flutter: Secondary | ICD-10-CM | POA: Diagnosis not present

## 2020-10-25 DIAGNOSIS — R002 Palpitations: Secondary | ICD-10-CM | POA: Diagnosis not present

## 2020-10-25 DIAGNOSIS — R509 Fever, unspecified: Secondary | ICD-10-CM | POA: Diagnosis not present

## 2020-10-25 DIAGNOSIS — Z79899 Other long term (current) drug therapy: Secondary | ICD-10-CM | POA: Diagnosis not present

## 2020-10-25 DIAGNOSIS — I2510 Atherosclerotic heart disease of native coronary artery without angina pectoris: Secondary | ICD-10-CM | POA: Diagnosis not present

## 2020-10-25 DIAGNOSIS — Z8679 Personal history of other diseases of the circulatory system: Secondary | ICD-10-CM | POA: Diagnosis not present

## 2020-10-25 DIAGNOSIS — R9431 Abnormal electrocardiogram [ECG] [EKG]: Secondary | ICD-10-CM | POA: Diagnosis not present

## 2020-10-25 DIAGNOSIS — I5032 Chronic diastolic (congestive) heart failure: Secondary | ICD-10-CM | POA: Diagnosis not present

## 2020-11-03 DIAGNOSIS — Z952 Presence of prosthetic heart valve: Secondary | ICD-10-CM | POA: Diagnosis not present

## 2020-11-03 DIAGNOSIS — I472 Ventricular tachycardia: Secondary | ICD-10-CM | POA: Diagnosis not present

## 2020-11-03 DIAGNOSIS — Z7901 Long term (current) use of anticoagulants: Secondary | ICD-10-CM | POA: Diagnosis not present

## 2020-11-09 DIAGNOSIS — Z0000 Encounter for general adult medical examination without abnormal findings: Secondary | ICD-10-CM | POA: Diagnosis not present

## 2020-11-09 DIAGNOSIS — I330 Acute and subacute infective endocarditis: Secondary | ICD-10-CM | POA: Diagnosis not present

## 2020-11-09 DIAGNOSIS — Z8719 Personal history of other diseases of the digestive system: Secondary | ICD-10-CM | POA: Diagnosis not present

## 2020-11-09 DIAGNOSIS — D631 Anemia in chronic kidney disease: Secondary | ICD-10-CM | POA: Diagnosis not present

## 2020-11-09 DIAGNOSIS — M109 Gout, unspecified: Secondary | ICD-10-CM | POA: Diagnosis not present

## 2020-11-09 DIAGNOSIS — I4891 Unspecified atrial fibrillation: Secondary | ICD-10-CM | POA: Diagnosis not present

## 2020-11-09 DIAGNOSIS — I129 Hypertensive chronic kidney disease with stage 1 through stage 4 chronic kidney disease, or unspecified chronic kidney disease: Secondary | ICD-10-CM | POA: Diagnosis not present

## 2020-11-09 DIAGNOSIS — I429 Cardiomyopathy, unspecified: Secondary | ICD-10-CM | POA: Diagnosis not present

## 2020-11-09 DIAGNOSIS — Z7901 Long term (current) use of anticoagulants: Secondary | ICD-10-CM | POA: Diagnosis not present

## 2020-11-09 DIAGNOSIS — N189 Chronic kidney disease, unspecified: Secondary | ICD-10-CM | POA: Diagnosis not present

## 2020-11-09 DIAGNOSIS — E782 Mixed hyperlipidemia: Secondary | ICD-10-CM | POA: Diagnosis not present

## 2020-11-09 DIAGNOSIS — N2581 Secondary hyperparathyroidism of renal origin: Secondary | ICD-10-CM | POA: Diagnosis not present

## 2020-11-09 DIAGNOSIS — N1832 Chronic kidney disease, stage 3b: Secondary | ICD-10-CM | POA: Diagnosis not present

## 2020-11-15 DIAGNOSIS — Z7901 Long term (current) use of anticoagulants: Secondary | ICD-10-CM | POA: Diagnosis not present

## 2020-11-15 DIAGNOSIS — I4891 Unspecified atrial fibrillation: Secondary | ICD-10-CM | POA: Diagnosis not present

## 2020-11-15 DIAGNOSIS — I348 Other nonrheumatic mitral valve disorders: Secondary | ICD-10-CM | POA: Diagnosis not present

## 2020-12-06 DIAGNOSIS — Z7901 Long term (current) use of anticoagulants: Secondary | ICD-10-CM | POA: Diagnosis not present

## 2020-12-06 DIAGNOSIS — Z952 Presence of prosthetic heart valve: Secondary | ICD-10-CM | POA: Diagnosis not present

## 2020-12-06 DIAGNOSIS — I359 Nonrheumatic aortic valve disorder, unspecified: Secondary | ICD-10-CM | POA: Diagnosis not present

## 2021-01-03 DIAGNOSIS — I359 Nonrheumatic aortic valve disorder, unspecified: Secondary | ICD-10-CM | POA: Diagnosis not present

## 2021-01-03 DIAGNOSIS — Z7901 Long term (current) use of anticoagulants: Secondary | ICD-10-CM | POA: Diagnosis not present

## 2021-01-03 DIAGNOSIS — I4891 Unspecified atrial fibrillation: Secondary | ICD-10-CM | POA: Diagnosis not present

## 2021-01-10 DIAGNOSIS — E7800 Pure hypercholesterolemia, unspecified: Secondary | ICD-10-CM | POA: Diagnosis not present

## 2021-01-10 DIAGNOSIS — Z952 Presence of prosthetic heart valve: Secondary | ICD-10-CM | POA: Diagnosis not present

## 2021-01-10 DIAGNOSIS — I2510 Atherosclerotic heart disease of native coronary artery without angina pectoris: Secondary | ICD-10-CM | POA: Diagnosis not present

## 2021-01-10 DIAGNOSIS — I472 Ventricular tachycardia: Secondary | ICD-10-CM | POA: Diagnosis not present

## 2021-01-10 DIAGNOSIS — Z7901 Long term (current) use of anticoagulants: Secondary | ICD-10-CM | POA: Diagnosis not present

## 2021-01-10 DIAGNOSIS — I484 Atypical atrial flutter: Secondary | ICD-10-CM | POA: Diagnosis not present

## 2021-01-10 DIAGNOSIS — I480 Paroxysmal atrial fibrillation: Secondary | ICD-10-CM | POA: Diagnosis not present

## 2021-01-10 DIAGNOSIS — I10 Essential (primary) hypertension: Secondary | ICD-10-CM | POA: Diagnosis not present

## 2021-01-31 DIAGNOSIS — I4891 Unspecified atrial fibrillation: Secondary | ICD-10-CM | POA: Diagnosis not present

## 2021-01-31 DIAGNOSIS — Z7901 Long term (current) use of anticoagulants: Secondary | ICD-10-CM | POA: Diagnosis not present

## 2021-01-31 DIAGNOSIS — I359 Nonrheumatic aortic valve disorder, unspecified: Secondary | ICD-10-CM | POA: Diagnosis not present

## 2021-02-12 DIAGNOSIS — I495 Sick sinus syndrome: Secondary | ICD-10-CM | POA: Diagnosis not present

## 2021-02-12 DIAGNOSIS — I4892 Unspecified atrial flutter: Secondary | ICD-10-CM | POA: Diagnosis not present

## 2021-02-12 DIAGNOSIS — I5020 Unspecified systolic (congestive) heart failure: Secondary | ICD-10-CM | POA: Diagnosis not present

## 2021-02-12 DIAGNOSIS — I472 Ventricular tachycardia: Secondary | ICD-10-CM | POA: Diagnosis not present

## 2021-02-21 DIAGNOSIS — I348 Other nonrheumatic mitral valve disorders: Secondary | ICD-10-CM | POA: Diagnosis not present

## 2021-02-21 DIAGNOSIS — I4891 Unspecified atrial fibrillation: Secondary | ICD-10-CM | POA: Diagnosis not present

## 2021-02-21 DIAGNOSIS — Z7901 Long term (current) use of anticoagulants: Secondary | ICD-10-CM | POA: Diagnosis not present

## 2021-02-26 DIAGNOSIS — R972 Elevated prostate specific antigen [PSA]: Secondary | ICD-10-CM | POA: Diagnosis not present

## 2021-03-05 DIAGNOSIS — R3912 Poor urinary stream: Secondary | ICD-10-CM | POA: Diagnosis not present

## 2021-03-05 DIAGNOSIS — N401 Enlarged prostate with lower urinary tract symptoms: Secondary | ICD-10-CM | POA: Diagnosis not present

## 2021-03-05 DIAGNOSIS — N481 Balanitis: Secondary | ICD-10-CM | POA: Diagnosis not present

## 2021-03-05 DIAGNOSIS — N402 Nodular prostate without lower urinary tract symptoms: Secondary | ICD-10-CM | POA: Diagnosis not present

## 2021-03-05 DIAGNOSIS — R972 Elevated prostate specific antigen [PSA]: Secondary | ICD-10-CM | POA: Diagnosis not present

## 2021-03-19 DIAGNOSIS — I4891 Unspecified atrial fibrillation: Secondary | ICD-10-CM | POA: Diagnosis not present

## 2021-03-19 DIAGNOSIS — Z7901 Long term (current) use of anticoagulants: Secondary | ICD-10-CM | POA: Diagnosis not present

## 2021-03-19 DIAGNOSIS — I359 Nonrheumatic aortic valve disorder, unspecified: Secondary | ICD-10-CM | POA: Diagnosis not present

## 2021-03-26 DIAGNOSIS — Z79899 Other long term (current) drug therapy: Secondary | ICD-10-CM | POA: Diagnosis not present

## 2021-03-26 DIAGNOSIS — I483 Typical atrial flutter: Secondary | ICD-10-CM | POA: Diagnosis not present

## 2021-03-26 DIAGNOSIS — I5032 Chronic diastolic (congestive) heart failure: Secondary | ICD-10-CM | POA: Diagnosis not present

## 2021-03-26 DIAGNOSIS — Z01810 Encounter for preprocedural cardiovascular examination: Secondary | ICD-10-CM | POA: Diagnosis not present

## 2021-03-26 DIAGNOSIS — Z7901 Long term (current) use of anticoagulants: Secondary | ICD-10-CM | POA: Diagnosis not present

## 2021-03-26 DIAGNOSIS — I472 Ventricular tachycardia: Secondary | ICD-10-CM | POA: Diagnosis not present

## 2021-03-26 DIAGNOSIS — Z20822 Contact with and (suspected) exposure to covid-19: Secondary | ICD-10-CM | POA: Diagnosis not present

## 2021-03-27 DIAGNOSIS — R002 Palpitations: Secondary | ICD-10-CM | POA: Diagnosis not present

## 2021-03-27 DIAGNOSIS — Z79899 Other long term (current) drug therapy: Secondary | ICD-10-CM | POA: Diagnosis not present

## 2021-03-27 DIAGNOSIS — Z7901 Long term (current) use of anticoagulants: Secondary | ICD-10-CM | POA: Diagnosis not present

## 2021-03-27 DIAGNOSIS — Z01818 Encounter for other preprocedural examination: Secondary | ICD-10-CM | POA: Diagnosis not present

## 2021-03-27 DIAGNOSIS — I130 Hypertensive heart and chronic kidney disease with heart failure and stage 1 through stage 4 chronic kidney disease, or unspecified chronic kidney disease: Secondary | ICD-10-CM | POA: Diagnosis not present

## 2021-03-27 DIAGNOSIS — N189 Chronic kidney disease, unspecified: Secondary | ICD-10-CM | POA: Diagnosis not present

## 2021-03-27 DIAGNOSIS — Z5181 Encounter for therapeutic drug level monitoring: Secondary | ICD-10-CM | POA: Diagnosis not present

## 2021-03-27 DIAGNOSIS — R000 Tachycardia, unspecified: Secondary | ICD-10-CM | POA: Diagnosis not present

## 2021-03-27 DIAGNOSIS — Z87891 Personal history of nicotine dependence: Secondary | ICD-10-CM | POA: Diagnosis not present

## 2021-03-27 DIAGNOSIS — R001 Bradycardia, unspecified: Secondary | ICD-10-CM | POA: Diagnosis not present

## 2021-03-27 DIAGNOSIS — I483 Typical atrial flutter: Secondary | ICD-10-CM | POA: Diagnosis not present

## 2021-03-27 DIAGNOSIS — I509 Heart failure, unspecified: Secondary | ICD-10-CM | POA: Diagnosis not present

## 2021-04-03 DIAGNOSIS — Z7901 Long term (current) use of anticoagulants: Secondary | ICD-10-CM | POA: Diagnosis not present

## 2021-04-03 DIAGNOSIS — I4891 Unspecified atrial fibrillation: Secondary | ICD-10-CM | POA: Diagnosis not present

## 2021-04-03 DIAGNOSIS — Z952 Presence of prosthetic heart valve: Secondary | ICD-10-CM | POA: Diagnosis not present

## 2021-04-11 DIAGNOSIS — E7800 Pure hypercholesterolemia, unspecified: Secondary | ICD-10-CM | POA: Diagnosis not present

## 2021-04-11 DIAGNOSIS — I472 Ventricular tachycardia: Secondary | ICD-10-CM | POA: Diagnosis not present

## 2021-04-11 DIAGNOSIS — Z952 Presence of prosthetic heart valve: Secondary | ICD-10-CM | POA: Diagnosis not present

## 2021-04-11 DIAGNOSIS — Z8679 Personal history of other diseases of the circulatory system: Secondary | ICD-10-CM | POA: Diagnosis not present

## 2021-04-11 DIAGNOSIS — I483 Typical atrial flutter: Secondary | ICD-10-CM | POA: Diagnosis not present

## 2021-04-11 DIAGNOSIS — I2510 Atherosclerotic heart disease of native coronary artery without angina pectoris: Secondary | ICD-10-CM | POA: Diagnosis not present

## 2021-04-11 DIAGNOSIS — I10 Essential (primary) hypertension: Secondary | ICD-10-CM | POA: Diagnosis not present

## 2021-04-26 DIAGNOSIS — Z952 Presence of prosthetic heart valve: Secondary | ICD-10-CM | POA: Diagnosis not present

## 2021-04-26 DIAGNOSIS — N401 Enlarged prostate with lower urinary tract symptoms: Secondary | ICD-10-CM | POA: Diagnosis not present

## 2021-04-26 DIAGNOSIS — I4891 Unspecified atrial fibrillation: Secondary | ICD-10-CM | POA: Diagnosis not present

## 2021-04-26 DIAGNOSIS — N1832 Chronic kidney disease, stage 3b: Secondary | ICD-10-CM | POA: Diagnosis not present

## 2021-04-26 DIAGNOSIS — R300 Dysuria: Secondary | ICD-10-CM | POA: Diagnosis not present

## 2021-04-26 DIAGNOSIS — I129 Hypertensive chronic kidney disease with stage 1 through stage 4 chronic kidney disease, or unspecified chronic kidney disease: Secondary | ICD-10-CM | POA: Diagnosis not present

## 2021-05-08 DIAGNOSIS — M109 Gout, unspecified: Secondary | ICD-10-CM | POA: Diagnosis not present

## 2021-05-08 DIAGNOSIS — R7301 Impaired fasting glucose: Secondary | ICD-10-CM | POA: Diagnosis not present

## 2021-05-08 DIAGNOSIS — E785 Hyperlipidemia, unspecified: Secondary | ICD-10-CM | POA: Diagnosis not present

## 2021-05-08 DIAGNOSIS — Z125 Encounter for screening for malignant neoplasm of prostate: Secondary | ICD-10-CM | POA: Diagnosis not present

## 2021-05-15 DIAGNOSIS — E785 Hyperlipidemia, unspecified: Secondary | ICD-10-CM | POA: Diagnosis not present

## 2021-05-15 DIAGNOSIS — R7301 Impaired fasting glucose: Secondary | ICD-10-CM | POA: Diagnosis not present

## 2021-05-15 DIAGNOSIS — M109 Gout, unspecified: Secondary | ICD-10-CM | POA: Diagnosis not present

## 2021-05-15 DIAGNOSIS — Z1212 Encounter for screening for malignant neoplasm of rectum: Secondary | ICD-10-CM | POA: Diagnosis not present

## 2021-05-15 DIAGNOSIS — R972 Elevated prostate specific antigen [PSA]: Secondary | ICD-10-CM | POA: Diagnosis not present

## 2021-05-15 DIAGNOSIS — R82998 Other abnormal findings in urine: Secondary | ICD-10-CM | POA: Diagnosis not present

## 2021-05-15 DIAGNOSIS — N1832 Chronic kidney disease, stage 3b: Secondary | ICD-10-CM | POA: Diagnosis not present

## 2021-05-15 DIAGNOSIS — E669 Obesity, unspecified: Secondary | ICD-10-CM | POA: Diagnosis not present

## 2021-05-15 DIAGNOSIS — I4891 Unspecified atrial fibrillation: Secondary | ICD-10-CM | POA: Diagnosis not present

## 2021-05-15 DIAGNOSIS — Z952 Presence of prosthetic heart valve: Secondary | ICD-10-CM | POA: Diagnosis not present

## 2021-05-15 DIAGNOSIS — K5901 Slow transit constipation: Secondary | ICD-10-CM | POA: Diagnosis not present

## 2021-05-15 DIAGNOSIS — I10 Essential (primary) hypertension: Secondary | ICD-10-CM | POA: Diagnosis not present

## 2021-05-15 DIAGNOSIS — Z7901 Long term (current) use of anticoagulants: Secondary | ICD-10-CM | POA: Diagnosis not present

## 2021-05-15 DIAGNOSIS — Z0000 Encounter for general adult medical examination without abnormal findings: Secondary | ICD-10-CM | POA: Diagnosis not present

## 2021-06-13 DIAGNOSIS — Z7901 Long term (current) use of anticoagulants: Secondary | ICD-10-CM | POA: Diagnosis not present

## 2021-06-13 DIAGNOSIS — Z952 Presence of prosthetic heart valve: Secondary | ICD-10-CM | POA: Diagnosis not present

## 2021-06-13 DIAGNOSIS — I4891 Unspecified atrial fibrillation: Secondary | ICD-10-CM | POA: Diagnosis not present

## 2021-07-12 DIAGNOSIS — I483 Typical atrial flutter: Secondary | ICD-10-CM | POA: Diagnosis not present

## 2021-07-12 DIAGNOSIS — I4892 Unspecified atrial flutter: Secondary | ICD-10-CM | POA: Diagnosis not present

## 2021-07-12 DIAGNOSIS — I472 Ventricular tachycardia: Secondary | ICD-10-CM | POA: Diagnosis not present

## 2021-07-12 DIAGNOSIS — Z5181 Encounter for therapeutic drug level monitoring: Secondary | ICD-10-CM | POA: Diagnosis not present

## 2021-07-12 DIAGNOSIS — Z79899 Other long term (current) drug therapy: Secondary | ICD-10-CM | POA: Diagnosis not present

## 2021-07-17 DIAGNOSIS — Z7901 Long term (current) use of anticoagulants: Secondary | ICD-10-CM | POA: Diagnosis not present

## 2021-07-17 DIAGNOSIS — I4891 Unspecified atrial fibrillation: Secondary | ICD-10-CM | POA: Diagnosis not present

## 2021-07-17 DIAGNOSIS — I359 Nonrheumatic aortic valve disorder, unspecified: Secondary | ICD-10-CM | POA: Diagnosis not present

## 2021-07-17 DIAGNOSIS — Z952 Presence of prosthetic heart valve: Secondary | ICD-10-CM | POA: Diagnosis not present

## 2021-07-26 DIAGNOSIS — N2581 Secondary hyperparathyroidism of renal origin: Secondary | ICD-10-CM | POA: Diagnosis not present

## 2021-07-26 DIAGNOSIS — I129 Hypertensive chronic kidney disease with stage 1 through stage 4 chronic kidney disease, or unspecified chronic kidney disease: Secondary | ICD-10-CM | POA: Diagnosis not present

## 2021-07-26 DIAGNOSIS — Z8719 Personal history of other diseases of the digestive system: Secondary | ICD-10-CM | POA: Diagnosis not present

## 2021-07-26 DIAGNOSIS — I330 Acute and subacute infective endocarditis: Secondary | ICD-10-CM | POA: Diagnosis not present

## 2021-07-26 DIAGNOSIS — I4892 Unspecified atrial flutter: Secondary | ICD-10-CM | POA: Diagnosis not present

## 2021-07-26 DIAGNOSIS — E782 Mixed hyperlipidemia: Secondary | ICD-10-CM | POA: Diagnosis not present

## 2021-07-26 DIAGNOSIS — N1832 Chronic kidney disease, stage 3b: Secondary | ICD-10-CM | POA: Diagnosis not present

## 2021-07-26 DIAGNOSIS — N189 Chronic kidney disease, unspecified: Secondary | ICD-10-CM | POA: Diagnosis not present

## 2021-07-26 DIAGNOSIS — I472 Ventricular tachycardia: Secondary | ICD-10-CM | POA: Diagnosis not present

## 2021-07-26 DIAGNOSIS — Z0000 Encounter for general adult medical examination without abnormal findings: Secondary | ICD-10-CM | POA: Diagnosis not present

## 2021-07-26 DIAGNOSIS — Z7901 Long term (current) use of anticoagulants: Secondary | ICD-10-CM | POA: Diagnosis not present

## 2021-07-26 DIAGNOSIS — D631 Anemia in chronic kidney disease: Secondary | ICD-10-CM | POA: Diagnosis not present

## 2021-07-26 DIAGNOSIS — M109 Gout, unspecified: Secondary | ICD-10-CM | POA: Diagnosis not present

## 2021-08-07 DIAGNOSIS — E119 Type 2 diabetes mellitus without complications: Secondary | ICD-10-CM | POA: Diagnosis not present

## 2021-08-07 DIAGNOSIS — H35033 Hypertensive retinopathy, bilateral: Secondary | ICD-10-CM | POA: Diagnosis not present

## 2021-08-28 DIAGNOSIS — I348 Other nonrheumatic mitral valve disorders: Secondary | ICD-10-CM | POA: Diagnosis not present

## 2021-08-28 DIAGNOSIS — I4891 Unspecified atrial fibrillation: Secondary | ICD-10-CM | POA: Diagnosis not present

## 2021-08-28 DIAGNOSIS — Z7901 Long term (current) use of anticoagulants: Secondary | ICD-10-CM | POA: Diagnosis not present

## 2021-08-29 DIAGNOSIS — R972 Elevated prostate specific antigen [PSA]: Secondary | ICD-10-CM | POA: Diagnosis not present

## 2021-09-03 DIAGNOSIS — R3912 Poor urinary stream: Secondary | ICD-10-CM | POA: Diagnosis not present

## 2021-09-03 DIAGNOSIS — N401 Enlarged prostate with lower urinary tract symptoms: Secondary | ICD-10-CM | POA: Diagnosis not present

## 2021-09-03 DIAGNOSIS — R972 Elevated prostate specific antigen [PSA]: Secondary | ICD-10-CM | POA: Diagnosis not present

## 2021-09-25 DIAGNOSIS — Z7901 Long term (current) use of anticoagulants: Secondary | ICD-10-CM | POA: Diagnosis not present

## 2021-09-25 DIAGNOSIS — Z952 Presence of prosthetic heart valve: Secondary | ICD-10-CM | POA: Diagnosis not present

## 2021-09-25 DIAGNOSIS — I4891 Unspecified atrial fibrillation: Secondary | ICD-10-CM | POA: Diagnosis not present

## 2021-10-17 DIAGNOSIS — I10 Essential (primary) hypertension: Secondary | ICD-10-CM | POA: Diagnosis not present

## 2021-10-17 DIAGNOSIS — Z7901 Long term (current) use of anticoagulants: Secondary | ICD-10-CM | POA: Diagnosis not present

## 2021-10-17 DIAGNOSIS — I5032 Chronic diastolic (congestive) heart failure: Secondary | ICD-10-CM | POA: Diagnosis not present

## 2021-10-17 DIAGNOSIS — Z952 Presence of prosthetic heart valve: Secondary | ICD-10-CM | POA: Diagnosis not present

## 2021-10-17 DIAGNOSIS — Z23 Encounter for immunization: Secondary | ICD-10-CM | POA: Diagnosis not present

## 2021-10-17 DIAGNOSIS — E7800 Pure hypercholesterolemia, unspecified: Secondary | ICD-10-CM | POA: Diagnosis not present

## 2021-10-17 DIAGNOSIS — I483 Typical atrial flutter: Secondary | ICD-10-CM | POA: Diagnosis not present

## 2021-10-17 DIAGNOSIS — I2510 Atherosclerotic heart disease of native coronary artery without angina pectoris: Secondary | ICD-10-CM | POA: Diagnosis not present

## 2021-10-17 DIAGNOSIS — Z8679 Personal history of other diseases of the circulatory system: Secondary | ICD-10-CM | POA: Diagnosis not present

## 2021-10-17 DIAGNOSIS — I472 Ventricular tachycardia, unspecified: Secondary | ICD-10-CM | POA: Diagnosis not present

## 2021-10-22 DIAGNOSIS — N1831 Chronic kidney disease, stage 3a: Secondary | ICD-10-CM | POA: Diagnosis not present

## 2021-10-22 DIAGNOSIS — I129 Hypertensive chronic kidney disease with stage 1 through stage 4 chronic kidney disease, or unspecified chronic kidney disease: Secondary | ICD-10-CM | POA: Diagnosis not present

## 2021-10-22 DIAGNOSIS — I4891 Unspecified atrial fibrillation: Secondary | ICD-10-CM | POA: Diagnosis not present

## 2021-10-22 DIAGNOSIS — I10 Essential (primary) hypertension: Secondary | ICD-10-CM | POA: Diagnosis not present

## 2021-10-24 IMAGING — DX DG CHEST 1V PORT
1 series · 1 of 1 positions shown · non-contrast
Comparison: 05/25/2020

CLINICAL DATA: Shortness of breath. Coronavirus infection.
Intermittent ventricular tachycardia.

EXAM:
PORTABLE CHEST 1 VIEW

[chest ap]
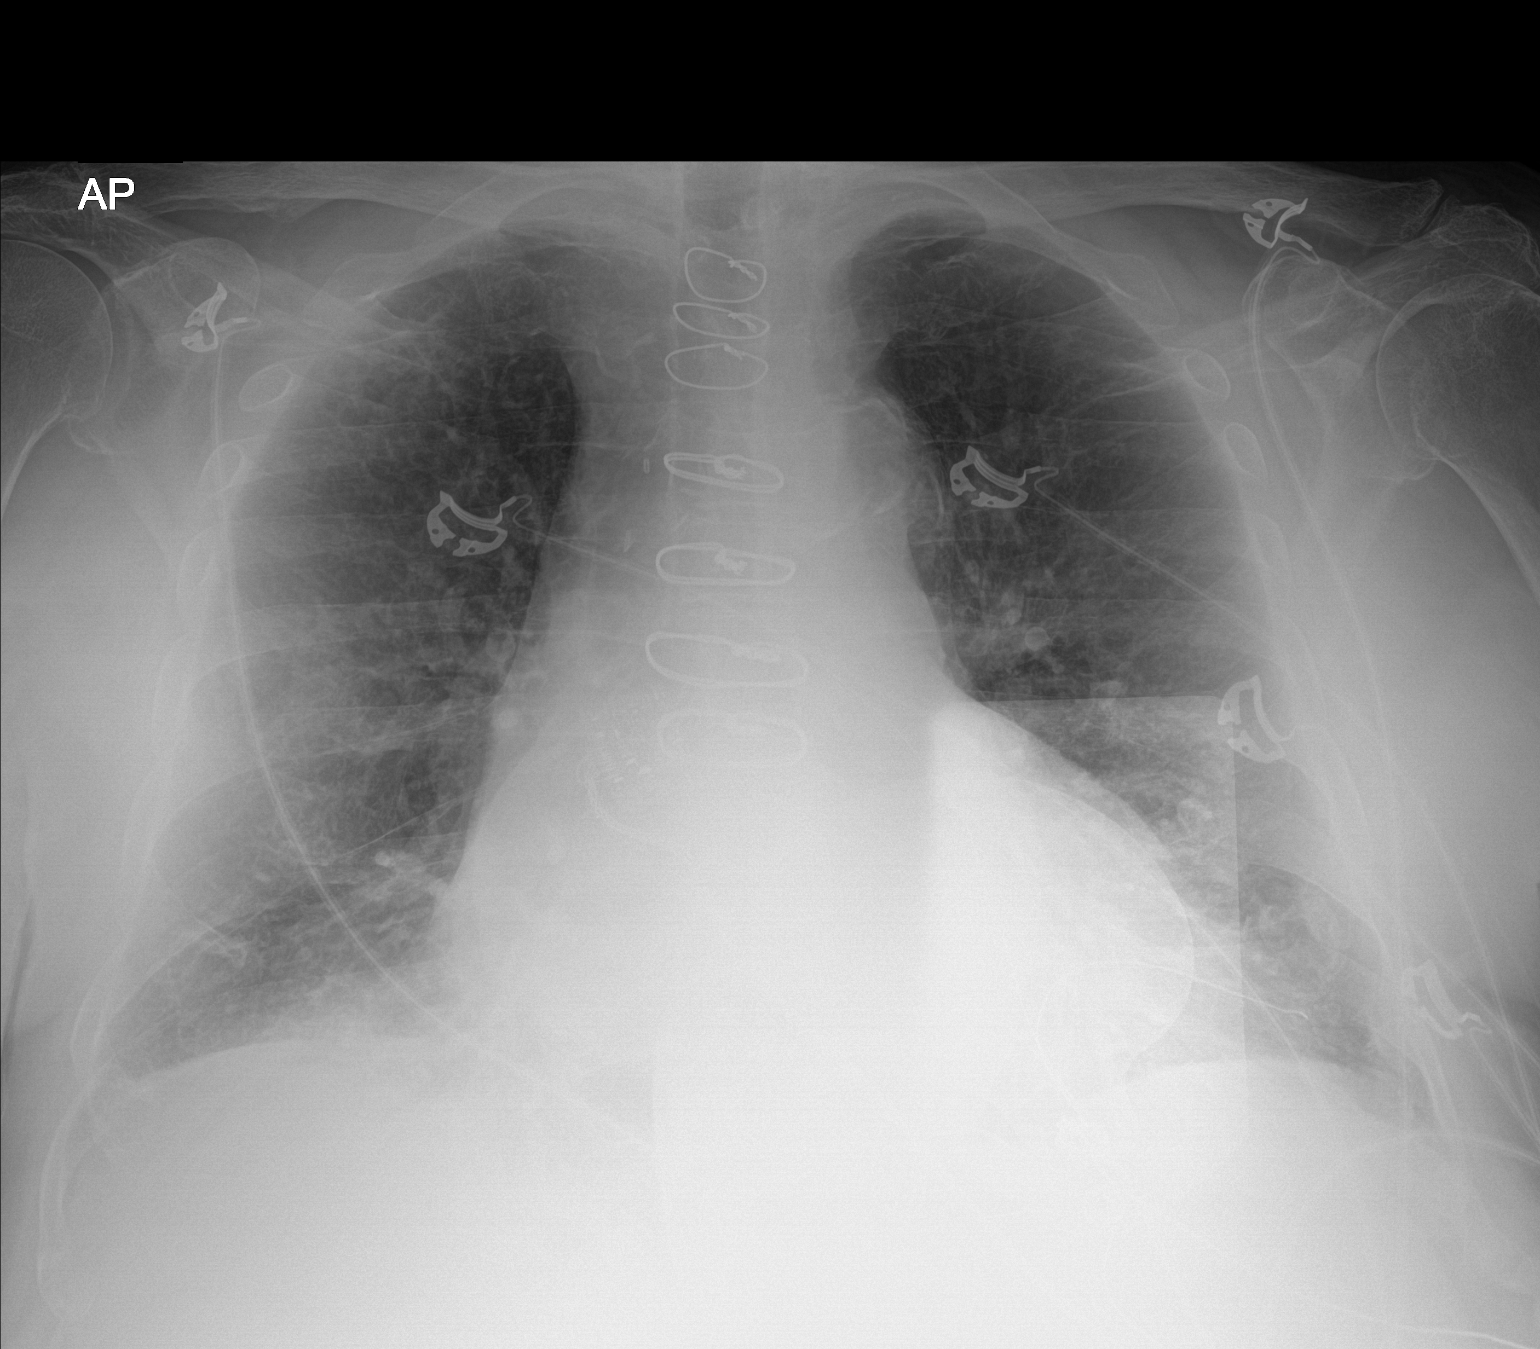

[1 of 1 positions shown; findings below may reference images not displayed]

FINDINGS: Previous median sternotomy. Cardiomegaly. Artifact overlies the
chest related to external pacer or defibrillator. Hazy/patchy
pulmonary infiltrates consistent with viral pneumonia, more notable
on the right. No dense consolidation or lobar collapse. No effusion.
No evidence of heart failure. Aortic atherosclerotic calcification
noted.
IMPRESSION: Hazy/patchy pulmonary infiltrates consistent with viral pneumonia.
No dense consolidation or lobar collapse. Cardiomegaly. No evidence
of heart failure.

## 2021-10-31 DIAGNOSIS — I4891 Unspecified atrial fibrillation: Secondary | ICD-10-CM | POA: Diagnosis not present

## 2021-10-31 DIAGNOSIS — Z7901 Long term (current) use of anticoagulants: Secondary | ICD-10-CM | POA: Diagnosis not present

## 2021-10-31 DIAGNOSIS — I359 Nonrheumatic aortic valve disorder, unspecified: Secondary | ICD-10-CM | POA: Diagnosis not present

## 2021-12-04 DIAGNOSIS — I4891 Unspecified atrial fibrillation: Secondary | ICD-10-CM | POA: Diagnosis not present

## 2021-12-04 DIAGNOSIS — U071 COVID-19: Secondary | ICD-10-CM | POA: Diagnosis not present

## 2021-12-04 DIAGNOSIS — Z7901 Long term (current) use of anticoagulants: Secondary | ICD-10-CM | POA: Diagnosis not present

## 2021-12-04 DIAGNOSIS — I359 Nonrheumatic aortic valve disorder, unspecified: Secondary | ICD-10-CM | POA: Diagnosis not present

## 2022-01-08 DIAGNOSIS — Z5181 Encounter for therapeutic drug level monitoring: Secondary | ICD-10-CM | POA: Diagnosis not present

## 2022-01-08 DIAGNOSIS — Z79899 Other long term (current) drug therapy: Secondary | ICD-10-CM | POA: Diagnosis not present

## 2022-01-15 DIAGNOSIS — I4891 Unspecified atrial fibrillation: Secondary | ICD-10-CM | POA: Diagnosis not present

## 2022-01-15 DIAGNOSIS — Z7901 Long term (current) use of anticoagulants: Secondary | ICD-10-CM | POA: Diagnosis not present

## 2022-01-15 DIAGNOSIS — Z952 Presence of prosthetic heart valve: Secondary | ICD-10-CM | POA: Diagnosis not present

## 2022-01-15 DIAGNOSIS — I359 Nonrheumatic aortic valve disorder, unspecified: Secondary | ICD-10-CM | POA: Diagnosis not present

## 2022-01-25 DIAGNOSIS — I472 Ventricular tachycardia, unspecified: Secondary | ICD-10-CM | POA: Diagnosis not present

## 2022-01-25 DIAGNOSIS — I484 Atypical atrial flutter: Secondary | ICD-10-CM | POA: Diagnosis not present

## 2022-01-25 DIAGNOSIS — Z79899 Other long term (current) drug therapy: Secondary | ICD-10-CM | POA: Diagnosis not present

## 2022-01-25 DIAGNOSIS — I459 Conduction disorder, unspecified: Secondary | ICD-10-CM | POA: Diagnosis not present

## 2022-01-25 DIAGNOSIS — I4891 Unspecified atrial fibrillation: Secondary | ICD-10-CM | POA: Diagnosis not present

## 2022-01-30 DIAGNOSIS — I4729 Other ventricular tachycardia: Secondary | ICD-10-CM | POA: Diagnosis not present

## 2022-01-30 DIAGNOSIS — I129 Hypertensive chronic kidney disease with stage 1 through stage 4 chronic kidney disease, or unspecified chronic kidney disease: Secondary | ICD-10-CM | POA: Diagnosis not present

## 2022-01-30 DIAGNOSIS — I330 Acute and subacute infective endocarditis: Secondary | ICD-10-CM | POA: Diagnosis not present

## 2022-01-30 DIAGNOSIS — I429 Cardiomyopathy, unspecified: Secondary | ICD-10-CM | POA: Diagnosis not present

## 2022-01-30 DIAGNOSIS — N189 Chronic kidney disease, unspecified: Secondary | ICD-10-CM | POA: Diagnosis not present

## 2022-01-30 DIAGNOSIS — I4892 Unspecified atrial flutter: Secondary | ICD-10-CM | POA: Diagnosis not present

## 2022-01-30 DIAGNOSIS — N2581 Secondary hyperparathyroidism of renal origin: Secondary | ICD-10-CM | POA: Diagnosis not present

## 2022-01-30 DIAGNOSIS — N1832 Chronic kidney disease, stage 3b: Secondary | ICD-10-CM | POA: Diagnosis not present

## 2022-01-30 DIAGNOSIS — D631 Anemia in chronic kidney disease: Secondary | ICD-10-CM | POA: Diagnosis not present

## 2022-01-30 DIAGNOSIS — M109 Gout, unspecified: Secondary | ICD-10-CM | POA: Diagnosis not present

## 2022-01-30 DIAGNOSIS — Z7901 Long term (current) use of anticoagulants: Secondary | ICD-10-CM | POA: Diagnosis not present

## 2022-01-30 DIAGNOSIS — Z8719 Personal history of other diseases of the digestive system: Secondary | ICD-10-CM | POA: Diagnosis not present

## 2022-01-30 DIAGNOSIS — E782 Mixed hyperlipidemia: Secondary | ICD-10-CM | POA: Diagnosis not present

## 2022-02-19 DIAGNOSIS — Z7901 Long term (current) use of anticoagulants: Secondary | ICD-10-CM | POA: Diagnosis not present

## 2022-02-19 DIAGNOSIS — Z79899 Other long term (current) drug therapy: Secondary | ICD-10-CM | POA: Diagnosis not present

## 2022-02-19 DIAGNOSIS — D6869 Other thrombophilia: Secondary | ICD-10-CM | POA: Diagnosis not present

## 2022-02-19 DIAGNOSIS — I480 Paroxysmal atrial fibrillation: Secondary | ICD-10-CM | POA: Diagnosis not present

## 2022-02-25 DIAGNOSIS — R972 Elevated prostate specific antigen [PSA]: Secondary | ICD-10-CM | POA: Diagnosis not present

## 2022-03-04 DIAGNOSIS — N401 Enlarged prostate with lower urinary tract symptoms: Secondary | ICD-10-CM | POA: Diagnosis not present

## 2022-03-04 DIAGNOSIS — R3912 Poor urinary stream: Secondary | ICD-10-CM | POA: Diagnosis not present

## 2022-03-04 DIAGNOSIS — R972 Elevated prostate specific antigen [PSA]: Secondary | ICD-10-CM | POA: Diagnosis not present

## 2022-03-27 ENCOUNTER — Inpatient Hospital Stay (HOSPITAL_COMMUNITY): Payer: Medicare Other

## 2022-03-27 ENCOUNTER — Inpatient Hospital Stay (HOSPITAL_BASED_OUTPATIENT_CLINIC_OR_DEPARTMENT_OTHER)
Admission: EM | Admit: 2022-03-27 | Discharge: 2022-04-04 | DRG: 871 | Disposition: A | Discharge status: Home Health | Payer: Medicare Other | Attending: Internal Medicine | Admitting: Internal Medicine

## 2022-03-27 ENCOUNTER — Emergency Department (HOSPITAL_BASED_OUTPATIENT_CLINIC_OR_DEPARTMENT_OTHER): Payer: Medicare Other

## 2022-03-27 ENCOUNTER — Encounter (HOSPITAL_BASED_OUTPATIENT_CLINIC_OR_DEPARTMENT_OTHER): Payer: Self-pay | Admitting: Emergency Medicine

## 2022-03-27 ENCOUNTER — Other Ambulatory Visit: Payer: Self-pay

## 2022-03-27 DIAGNOSIS — D6959 Other secondary thrombocytopenia: Secondary | ICD-10-CM | POA: Diagnosis present

## 2022-03-27 DIAGNOSIS — I248 Other forms of acute ischemic heart disease: Secondary | ICD-10-CM | POA: Diagnosis not present

## 2022-03-27 DIAGNOSIS — A491 Streptococcal infection, unspecified site: Secondary | ICD-10-CM | POA: Diagnosis present

## 2022-03-27 DIAGNOSIS — K8020 Calculus of gallbladder without cholecystitis without obstruction: Secondary | ICD-10-CM | POA: Diagnosis present

## 2022-03-27 DIAGNOSIS — N189 Chronic kidney disease, unspecified: Secondary | ICD-10-CM | POA: Diagnosis not present

## 2022-03-27 DIAGNOSIS — N184 Chronic kidney disease, stage 4 (severe): Secondary | ICD-10-CM | POA: Diagnosis not present

## 2022-03-27 DIAGNOSIS — M1A9XX0 Chronic gout, unspecified, without tophus (tophi): Secondary | ICD-10-CM | POA: Diagnosis present

## 2022-03-27 DIAGNOSIS — A419 Sepsis, unspecified organism: Principal | ICD-10-CM

## 2022-03-27 DIAGNOSIS — Z8619 Personal history of other infectious and parasitic diseases: Secondary | ICD-10-CM | POA: Diagnosis not present

## 2022-03-27 DIAGNOSIS — Z8616 Personal history of COVID-19: Secondary | ICD-10-CM

## 2022-03-27 DIAGNOSIS — Z888 Allergy status to other drugs, medicaments and biological substances status: Secondary | ICD-10-CM

## 2022-03-27 DIAGNOSIS — R7989 Other specified abnormal findings of blood chemistry: Secondary | ICD-10-CM | POA: Diagnosis present

## 2022-03-27 DIAGNOSIS — D631 Anemia in chronic kidney disease: Secondary | ICD-10-CM | POA: Diagnosis not present

## 2022-03-27 DIAGNOSIS — J9601 Acute respiratory failure with hypoxia: Secondary | ICD-10-CM | POA: Diagnosis present

## 2022-03-27 DIAGNOSIS — I088 Other rheumatic multiple valve diseases: Secondary | ICD-10-CM | POA: Diagnosis not present

## 2022-03-27 DIAGNOSIS — I517 Cardiomegaly: Secondary | ICD-10-CM | POA: Diagnosis not present

## 2022-03-27 DIAGNOSIS — R791 Abnormal coagulation profile: Secondary | ICD-10-CM | POA: Diagnosis not present

## 2022-03-27 DIAGNOSIS — R6520 Severe sepsis without septic shock: Secondary | ICD-10-CM | POA: Diagnosis not present

## 2022-03-27 DIAGNOSIS — N179 Acute kidney failure, unspecified: Secondary | ICD-10-CM | POA: Diagnosis present

## 2022-03-27 DIAGNOSIS — Z87891 Personal history of nicotine dependence: Secondary | ICD-10-CM

## 2022-03-27 DIAGNOSIS — E869 Volume depletion, unspecified: Secondary | ICD-10-CM | POA: Diagnosis present

## 2022-03-27 DIAGNOSIS — J189 Pneumonia, unspecified organism: Secondary | ICD-10-CM | POA: Diagnosis not present

## 2022-03-27 DIAGNOSIS — Z860100 Personal history of colon polyps, unspecified: Secondary | ICD-10-CM

## 2022-03-27 DIAGNOSIS — Y831 Surgical operation with implant of artificial internal device as the cause of abnormal reaction of the patient, or of later complication, without mention of misadventure at the time of the procedure: Secondary | ICD-10-CM | POA: Diagnosis present

## 2022-03-27 DIAGNOSIS — M1A00X0 Idiopathic chronic gout, unspecified site, without tophus (tophi): Secondary | ICD-10-CM | POA: Diagnosis present

## 2022-03-27 DIAGNOSIS — E213 Hyperparathyroidism, unspecified: Secondary | ICD-10-CM | POA: Diagnosis present

## 2022-03-27 DIAGNOSIS — A409 Streptococcal sepsis, unspecified: Principal | ICD-10-CM | POA: Diagnosis present

## 2022-03-27 DIAGNOSIS — I4729 Other ventricular tachycardia: Secondary | ICD-10-CM | POA: Diagnosis present

## 2022-03-27 DIAGNOSIS — I5032 Chronic diastolic (congestive) heart failure: Secondary | ICD-10-CM | POA: Diagnosis present

## 2022-03-27 DIAGNOSIS — I959 Hypotension, unspecified: Secondary | ICD-10-CM | POA: Diagnosis not present

## 2022-03-27 DIAGNOSIS — I4892 Unspecified atrial flutter: Secondary | ICD-10-CM | POA: Diagnosis present

## 2022-03-27 DIAGNOSIS — D689 Coagulation defect, unspecified: Secondary | ICD-10-CM | POA: Diagnosis present

## 2022-03-27 DIAGNOSIS — E872 Acidosis, unspecified: Secondary | ICD-10-CM | POA: Diagnosis present

## 2022-03-27 DIAGNOSIS — I330 Acute and subacute infective endocarditis: Secondary | ICD-10-CM

## 2022-03-27 DIAGNOSIS — I5042 Chronic combined systolic (congestive) and diastolic (congestive) heart failure: Secondary | ICD-10-CM | POA: Diagnosis present

## 2022-03-27 DIAGNOSIS — E66811 Obesity, class 1: Secondary | ICD-10-CM | POA: Diagnosis present

## 2022-03-27 DIAGNOSIS — E785 Hyperlipidemia, unspecified: Secondary | ICD-10-CM | POA: Diagnosis present

## 2022-03-27 DIAGNOSIS — Z952 Presence of prosthetic heart valve: Secondary | ICD-10-CM

## 2022-03-27 DIAGNOSIS — U071 COVID-19: Secondary | ICD-10-CM | POA: Insufficient documentation

## 2022-03-27 DIAGNOSIS — K5730 Diverticulosis of large intestine without perforation or abscess without bleeding: Secondary | ICD-10-CM | POA: Diagnosis not present

## 2022-03-27 DIAGNOSIS — Z91030 Bee allergy status: Secondary | ICD-10-CM

## 2022-03-27 DIAGNOSIS — I714 Abdominal aortic aneurysm, without rupture, unspecified: Secondary | ICD-10-CM | POA: Diagnosis present

## 2022-03-27 DIAGNOSIS — I4891 Unspecified atrial fibrillation: Secondary | ICD-10-CM | POA: Diagnosis not present

## 2022-03-27 DIAGNOSIS — T826XXA Infection and inflammatory reaction due to cardiac valve prosthesis, initial encounter: Secondary | ICD-10-CM | POA: Diagnosis present

## 2022-03-27 DIAGNOSIS — K6389 Other specified diseases of intestine: Secondary | ICD-10-CM | POA: Diagnosis not present

## 2022-03-27 DIAGNOSIS — J690 Pneumonitis due to inhalation of food and vomit: Secondary | ICD-10-CM | POA: Diagnosis present

## 2022-03-27 DIAGNOSIS — Z8679 Personal history of other diseases of the circulatory system: Secondary | ICD-10-CM

## 2022-03-27 DIAGNOSIS — I110 Hypertensive heart disease with heart failure: Secondary | ICD-10-CM | POA: Diagnosis not present

## 2022-03-27 DIAGNOSIS — K449 Diaphragmatic hernia without obstruction or gangrene: Secondary | ICD-10-CM | POA: Diagnosis not present

## 2022-03-27 DIAGNOSIS — M109 Gout, unspecified: Secondary | ICD-10-CM | POA: Insufficient documentation

## 2022-03-27 DIAGNOSIS — I2510 Atherosclerotic heart disease of native coronary artery without angina pectoris: Secondary | ICD-10-CM | POA: Diagnosis present

## 2022-03-27 DIAGNOSIS — I130 Hypertensive heart and chronic kidney disease with heart failure and stage 1 through stage 4 chronic kidney disease, or unspecified chronic kidney disease: Secondary | ICD-10-CM | POA: Diagnosis present

## 2022-03-27 DIAGNOSIS — R1310 Dysphagia, unspecified: Secondary | ICD-10-CM | POA: Diagnosis not present

## 2022-03-27 DIAGNOSIS — R4182 Altered mental status, unspecified: Secondary | ICD-10-CM | POA: Diagnosis not present

## 2022-03-27 DIAGNOSIS — N2581 Secondary hyperparathyroidism of renal origin: Secondary | ICD-10-CM | POA: Diagnosis not present

## 2022-03-27 DIAGNOSIS — I10 Essential (primary) hypertension: Secondary | ICD-10-CM | POA: Diagnosis present

## 2022-03-27 DIAGNOSIS — E669 Obesity, unspecified: Secondary | ICD-10-CM | POA: Diagnosis present

## 2022-03-27 DIAGNOSIS — Z7901 Long term (current) use of anticoagulants: Secondary | ICD-10-CM

## 2022-03-27 DIAGNOSIS — R531 Weakness: Secondary | ICD-10-CM | POA: Diagnosis not present

## 2022-03-27 DIAGNOSIS — K3189 Other diseases of stomach and duodenum: Secondary | ICD-10-CM | POA: Diagnosis not present

## 2022-03-27 DIAGNOSIS — Z951 Presence of aortocoronary bypass graft: Secondary | ICD-10-CM | POA: Diagnosis not present

## 2022-03-27 DIAGNOSIS — R918 Other nonspecific abnormal finding of lung field: Secondary | ICD-10-CM | POA: Diagnosis not present

## 2022-03-27 DIAGNOSIS — B955 Unspecified streptococcus as the cause of diseases classified elsewhere: Secondary | ICD-10-CM | POA: Diagnosis not present

## 2022-03-27 DIAGNOSIS — I472 Ventricular tachycardia, unspecified: Secondary | ICD-10-CM | POA: Diagnosis present

## 2022-03-27 DIAGNOSIS — Z6832 Body mass index (BMI) 32.0-32.9, adult: Secondary | ICD-10-CM

## 2022-03-27 DIAGNOSIS — R350 Frequency of micturition: Secondary | ICD-10-CM | POA: Diagnosis present

## 2022-03-27 DIAGNOSIS — Z8719 Personal history of other diseases of the digestive system: Secondary | ICD-10-CM | POA: Insufficient documentation

## 2022-03-27 DIAGNOSIS — R778 Other specified abnormalities of plasma proteins: Secondary | ICD-10-CM | POA: Diagnosis present

## 2022-03-27 DIAGNOSIS — N401 Enlarged prostate with lower urinary tract symptoms: Secondary | ICD-10-CM | POA: Insufficient documentation

## 2022-03-27 DIAGNOSIS — I480 Paroxysmal atrial fibrillation: Secondary | ICD-10-CM | POA: Diagnosis not present

## 2022-03-27 DIAGNOSIS — R7881 Bacteremia: Secondary | ICD-10-CM | POA: Diagnosis not present

## 2022-03-27 DIAGNOSIS — Z452 Encounter for adjustment and management of vascular access device: Secondary | ICD-10-CM | POA: Diagnosis not present

## 2022-03-27 DIAGNOSIS — D696 Thrombocytopenia, unspecified: Secondary | ICD-10-CM | POA: Diagnosis not present

## 2022-03-27 DIAGNOSIS — Z79899 Other long term (current) drug therapy: Secondary | ICD-10-CM

## 2022-03-27 DIAGNOSIS — I509 Heart failure, unspecified: Secondary | ICD-10-CM | POA: Diagnosis not present

## 2022-03-27 DIAGNOSIS — Z8249 Family history of ischemic heart disease and other diseases of the circulatory system: Secondary | ICD-10-CM

## 2022-03-27 LAB — CBC WITH DIFFERENTIAL/PLATELET
Abs Immature Granulocytes: 0.1 10*3/uL — ABNORMAL HIGH (ref 0.00–0.07)
Basophils Absolute: 0 10*3/uL (ref 0.0–0.1)
Basophils Relative: 0 %
Eosinophils Absolute: 0 10*3/uL (ref 0.0–0.5)
Eosinophils Relative: 0 %
HCT: 37.1 % — ABNORMAL LOW (ref 39.0–52.0)
Hemoglobin: 12.6 g/dL — ABNORMAL LOW (ref 13.0–17.0)
Immature Granulocytes: 1 %
Lymphocytes Relative: 1 %
Lymphs Abs: 0.1 10*3/uL — ABNORMAL LOW (ref 0.7–4.0)
MCH: 33.8 pg (ref 26.0–34.0)
MCHC: 34 g/dL (ref 30.0–36.0)
MCV: 99.5 fL (ref 80.0–100.0)
Monocytes Absolute: 0.2 10*3/uL (ref 0.1–1.0)
Monocytes Relative: 2 %
Neutro Abs: 13.1 10*3/uL — ABNORMAL HIGH (ref 1.7–7.7)
Neutrophils Relative %: 96 %
Platelets: 151 10*3/uL (ref 150–400)
RBC: 3.73 MIL/uL — ABNORMAL LOW (ref 4.22–5.81)
RDW: 14.8 % (ref 11.5–15.5)
WBC: 13.6 10*3/uL — ABNORMAL HIGH (ref 4.0–10.5)
nRBC: 0 % (ref 0.0–0.2)

## 2022-03-27 LAB — COMPREHENSIVE METABOLIC PANEL
ALT: 18 U/L (ref 0–44)
AST: 29 U/L (ref 15–41)
Albumin: 4.1 g/dL (ref 3.5–5.0)
Alkaline Phosphatase: 66 U/L (ref 38–126)
Anion gap: 10 (ref 5–15)
BUN: 30 mg/dL — ABNORMAL HIGH (ref 8–23)
CO2: 25 mmol/L (ref 22–32)
Calcium: 9.8 mg/dL (ref 8.9–10.3)
Chloride: 102 mmol/L (ref 98–111)
Creatinine, Ser: 2.11 mg/dL — ABNORMAL HIGH (ref 0.61–1.24)
GFR, Estimated: 29 mL/min — ABNORMAL LOW (ref >60)
Glucose, Bld: 156 mg/dL — ABNORMAL HIGH (ref 70–99)
Potassium: 3.9 mmol/L (ref 3.5–5.1)
Sodium: 137 mmol/L (ref 135–145)
Total Bilirubin: 1.3 mg/dL — ABNORMAL HIGH (ref 0.3–1.2)
Total Protein: 6.5 g/dL (ref 6.5–8.1)

## 2022-03-27 LAB — URINALYSIS, ROUTINE W REFLEX MICROSCOPIC
Bilirubin Urine: NEGATIVE
Glucose, UA: NEGATIVE mg/dL
Ketones, ur: NEGATIVE mg/dL
Leukocytes,Ua: NEGATIVE
Nitrite: NEGATIVE
Protein, ur: 30 mg/dL — AB
Specific Gravity, Urine: 1.015 (ref 1.005–1.030)
pH: 7 (ref 5.0–8.0)

## 2022-03-27 LAB — PHOSPHORUS: Phosphorus: 2.6 mg/dL (ref 2.5–4.6)

## 2022-03-27 LAB — MAGNESIUM: Magnesium: 1.9 mg/dL (ref 1.7–2.4)

## 2022-03-27 LAB — APTT: aPTT: 67 seconds — ABNORMAL HIGH (ref 24–36)

## 2022-03-27 LAB — STREP PNEUMONIAE URINARY ANTIGEN: Strep Pneumo Urinary Antigen: NEGATIVE

## 2022-03-27 LAB — PROTIME-INR
INR: 4.6 (ref 0.8–1.2)
Prothrombin Time: 42.9 seconds — ABNORMAL HIGH (ref 11.4–15.2)

## 2022-03-27 LAB — RESP PANEL BY RT-PCR (FLU A&B, COVID) ARPGX2
Influenza A by PCR: NEGATIVE
Influenza B by PCR: NEGATIVE
SARS Coronavirus 2 by RT PCR: NEGATIVE

## 2022-03-27 LAB — TROPONIN I (HIGH SENSITIVITY)
Troponin I (High Sensitivity): 44 ng/L — ABNORMAL HIGH (ref <18)
Troponin I (High Sensitivity): 67 ng/L — ABNORMAL HIGH (ref <18)

## 2022-03-27 LAB — URINALYSIS, MICROSCOPIC (REFLEX)

## 2022-03-27 LAB — BRAIN NATRIURETIC PEPTIDE: B Natriuretic Peptide: 843.4 pg/mL — ABNORMAL HIGH (ref 0.0–100.0)

## 2022-03-27 LAB — LACTIC ACID, PLASMA
Lactic Acid, Venous: 2 mmol/L (ref 0.5–1.9)
Lactic Acid, Venous: 1.8 mmol/L (ref 0.5–1.9)

## 2022-03-27 MED ORDER — METRONIDAZOLE 500 MG/100ML IV SOLN
500.0000 mg | Freq: Two times a day (BID) | INTRAVENOUS | Status: DC
  Administered 2022-03-28: 500 mg via INTRAVENOUS
  Filled 2022-03-27: qty 100

## 2022-03-27 MED ORDER — LACTATED RINGERS IV BOLUS
1000.0000 mL | Freq: Once | INTRAVENOUS | Status: AC
  Administered 2022-03-27: 1000 mL via INTRAVENOUS

## 2022-03-27 MED ORDER — CALCITRIOL 0.5 MCG PO CAPS
0.5000 ug | ORAL_CAPSULE | Freq: Every day | ORAL | Status: DC
  Administered 2022-03-27 – 2022-04-04 (×9): 0.5 ug via ORAL
  Filled 2022-03-27 (×10): qty 1

## 2022-03-27 MED ORDER — LEVALBUTEROL HCL 1.25 MG/0.5ML IN NEBU
1.2500 mg | INHALATION_SOLUTION | Freq: Four times a day (QID) | RESPIRATORY_TRACT | Status: DC | PRN
  Administered 2022-03-28: 1.25 mg via RESPIRATORY_TRACT
  Filled 2022-03-27 (×2): qty 0.5

## 2022-03-27 MED ORDER — PHYTONADIONE 5 MG PO TABS
2.5000 mg | ORAL_TABLET | Freq: Once | ORAL | Status: AC
  Administered 2022-03-27: 2.5 mg via ORAL
  Filled 2022-03-27: qty 1

## 2022-03-27 MED ORDER — TAMSULOSIN HCL 0.4 MG PO CAPS
0.4000 mg | ORAL_CAPSULE | Freq: Every day | ORAL | Status: DC
  Administered 2022-03-27 – 2022-04-04 (×9): 0.4 mg via ORAL
  Filled 2022-03-27 (×9): qty 1

## 2022-03-27 MED ORDER — METRONIDAZOLE 500 MG/100ML IV SOLN
500.0000 mg | Freq: Two times a day (BID) | INTRAVENOUS | Status: DC
  Administered 2022-03-27: 500 mg via INTRAVENOUS
  Filled 2022-03-27: qty 100

## 2022-03-27 MED ORDER — SODIUM CHLORIDE 0.9 % IV SOLN
1.0000 g | INTRAVENOUS | Status: DC
  Filled 2022-03-27: qty 10

## 2022-03-27 MED ORDER — ONDANSETRON HCL 4 MG PO TABS: 4.0000 mg | ORAL_TABLET | Freq: Four times a day (QID) | ORAL | Status: DC | PRN

## 2022-03-27 MED ORDER — AMIODARONE HCL 200 MG PO TABS
200.0000 mg | ORAL_TABLET | Freq: Every day | ORAL | Status: DC
Start: 2022-03-27 — End: 2022-04-04
  Administered 2022-03-27 – 2022-04-04 (×9): 200 mg via ORAL
  Filled 2022-03-27 (×9): qty 1

## 2022-03-27 MED ORDER — VANCOMYCIN HCL IN DEXTROSE 1-5 GM/200ML-% IV SOLN
1000.0000 mg | Freq: Once | INTRAVENOUS | Status: AC
  Administered 2022-03-27: 1000 mg via INTRAVENOUS
  Filled 2022-03-27: qty 200

## 2022-03-27 MED ORDER — SODIUM CHLORIDE 0.9 % IV SOLN
1.0000 g | Freq: Once | INTRAVENOUS | Status: AC
  Administered 2022-03-27: 1 g via INTRAVENOUS
  Filled 2022-03-27: qty 10

## 2022-03-27 MED ORDER — MEXILETINE HCL 200 MG PO CAPS
200.0000 mg | ORAL_CAPSULE | Freq: Three times a day (TID) | ORAL | Status: DC
  Administered 2022-03-27 – 2022-04-04 (×21): 200 mg via ORAL
  Filled 2022-03-27 (×26): qty 1

## 2022-03-27 MED ORDER — ACETAMINOPHEN 325 MG PO TABS
650.0000 mg | ORAL_TABLET | Freq: Four times a day (QID) | ORAL | Status: DC | PRN
  Administered 2022-03-27 – 2022-03-29 (×3): 650 mg via ORAL
  Filled 2022-03-27 (×3): qty 2

## 2022-03-27 MED ORDER — LACTATED RINGERS IV SOLN: INTRAVENOUS | Status: DC

## 2022-03-27 MED ORDER — BUMETANIDE 1 MG PO TABS
2.0000 mg | ORAL_TABLET | Freq: Every day | ORAL | Status: DC
  Administered 2022-03-27: 2 mg via ORAL
  Filled 2022-03-27 (×3): qty 2

## 2022-03-27 MED ORDER — LORATADINE 10 MG PO TABS
10.0000 mg | ORAL_TABLET | Freq: Every day | ORAL | Status: DC
  Administered 2022-03-27 – 2022-04-04 (×9): 10 mg via ORAL
  Filled 2022-03-27 (×9): qty 1

## 2022-03-27 MED ORDER — POTASSIUM CHLORIDE CRYS ER 20 MEQ PO TBCR
20.0000 meq | EXTENDED_RELEASE_TABLET | Freq: Two times a day (BID) | ORAL | Status: DC
  Administered 2022-03-27 – 2022-04-04 (×16): 20 meq via ORAL
  Filled 2022-03-27 (×16): qty 1

## 2022-03-27 MED ORDER — PRAVASTATIN SODIUM 40 MG PO TABS
80.0000 mg | ORAL_TABLET | Freq: Every day | ORAL | Status: DC
Start: 2022-03-27 — End: 2022-03-27
  Administered 2022-03-27: 80 mg via ORAL
  Filled 2022-03-27: qty 2

## 2022-03-27 MED ORDER — ONDANSETRON HCL 4 MG/2ML IJ SOLN: 4.0000 mg | Freq: Four times a day (QID) | INTRAMUSCULAR | Status: DC | PRN

## 2022-03-27 MED ORDER — ORAL CARE MOUTH RINSE
15.0000 mL | Freq: Two times a day (BID) | OROMUCOSAL | Status: DC
  Administered 2022-03-27 – 2022-04-04 (×13): 15 mL via OROMUCOSAL

## 2022-03-27 MED ORDER — VANCOMYCIN HCL 1250 MG/250ML IV SOLN
1250.0000 mg | INTRAVENOUS | Status: DC
  Filled 2022-03-27: qty 250

## 2022-03-27 MED ORDER — ACETAMINOPHEN 650 MG RE SUPP: 650.0000 mg | Freq: Four times a day (QID) | RECTAL | Status: DC | PRN

## 2022-03-27 MED ORDER — ALLOPURINOL 300 MG PO TABS
400.0000 mg | ORAL_TABLET | Freq: Every day | ORAL | Status: DC
  Administered 2022-03-27 – 2022-04-04 (×9): 400 mg via ORAL
  Filled 2022-03-27 (×9): qty 1

## 2022-03-27 MED ORDER — PANTOPRAZOLE SODIUM 40 MG PO TBEC
40.0000 mg | DELAYED_RELEASE_TABLET | Freq: Every day | ORAL | Status: DC
  Administered 2022-03-27 – 2022-04-04 (×9): 40 mg via ORAL
  Filled 2022-03-27 (×9): qty 1

## 2022-03-27 MED ORDER — SODIUM CHLORIDE 0.9 % IV SOLN
500.0000 mg | Freq: Once | INTRAVENOUS | Status: DC
  Administered 2022-03-27: 500 mg via INTRAVENOUS
  Filled 2022-03-27: qty 5

## 2022-03-27 MED ORDER — LACTATED RINGERS IV SOLN: INTRAVENOUS | Status: AC

## 2022-03-27 MED ORDER — VANCOMYCIN HCL 500 MG IV SOLR
500.0000 mg | Freq: Once | INTRAVENOUS | Status: AC
  Administered 2022-03-27: 500 mg via INTRAVENOUS

## 2022-03-27 MED ORDER — BUMETANIDE 1 MG PO TABS: 2.0000 mg | ORAL_TABLET | Freq: Two times a day (BID) | ORAL | Status: DC

## 2022-03-27 MED ORDER — BUMETANIDE 1 MG PO TABS
4.0000 mg | ORAL_TABLET | Freq: Every day | ORAL | Status: DC
  Administered 2022-03-28 – 2022-03-29 (×2): 4 mg via ORAL
  Filled 2022-03-27 (×3): qty 4

## 2022-03-27 MED ORDER — SIMETHICONE 80 MG PO CHEW: 80.0000 mg | CHEWABLE_TABLET | Freq: Four times a day (QID) | ORAL | Status: DC | PRN

## 2022-03-27 NOTE — ED Provider Notes (Addendum)
[facility] EMERGENCY DEPARTMENT Provider Note   CSN: [id number] Arrival date & time: 03/27/22  0857     History  Chief Complaint  Patient presents with   Weakness   Fever    Steve Kerwick. is a 88 y.o. male.  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  Gen weakness 2-3 days, Did use the riding lawn mower yesterday, however with worsening last night and overnight, this morning very weak, difficulty getting out of car Nausea, vomiting up phlegm last night after eating grits for dinner  101 fever, tylenol 8AM  Reports has had some urinary frequency.  Denies abdominal pain, chest pain, shortness of breath, increased leg swelling, sore throat, runny nose, rash, headache.   Past Medical History:  Diagnosis Date   Anemia    Arthritis    Diverticulosis    Endocarditis    GI bleed    Gout    Heart murmur    Hx of adenomatous colonic polyps    Hyperlipemia    Hyperparathyroidism (HCC)    Hypertension    Nephrosclerosis     Home Medications Prior to Admission medications   Medication Sig Start Date End Date Taking? Authorizing Provider  acetaminophen (TYLENOL) 650 MG CR tablet Take 650 mg by mouth every 8 (eight) hours as needed.   Yes [provider]  allopurinol (ZYLOPRIM) 100 MG tablet Take 400 mg by mouth daily.   Yes [provider]  bumetanide (BUMEX) 2 MG tablet Take 2 tablets by mouth in the morning and 1 tablet in the evening   Yes [provider]   calcitRIOL (ROCALTROL) 0.5 MCG capsule Take one tablet by mouth daily 08/10/12  Yes [provider]  cetirizine (ZYRTEC) 10 MG tablet Take 10 mg by mouth daily.   Yes [provider]  fish oil-omega-3 fatty acids 1000 MG capsule Take 1 g by mouth daily.   Yes [provider]  glucosamine-chondroitin 500-400 MG tablet Take 1 tablet by mouth daily.    Yes [provider]  Multiple Vitamins-Minerals (MULTIVITAMIN WITH MINERALS) tablet Take 1 tablet by mouth daily.   Yes [provider]  omeprazole (PRILOSEC) 20 MG capsule Take 1 capsule (20 mg total) by mouth daily. 06/20/15  Yes Hart Carwin, MD  potassium chloride SA (K-DUR,KLOR-CON) 20 MEQ tablet Take 20 mEq by mouth 2 (two) times daily.   Yes [provider]  pravastatin (PRAVACHOL) 40 MG tablet Take 80 mg by mouth daily.    Yes [provider]  tamsulosin (FLOMAX) 0.4 MG CAPS capsule Take 0.4 mg by mouth daily.   Yes [provider]  warfarin (COUMADIN) 5 MG tablet Take 5 mg by mouth daily.    Yes [provider]  diphenhydrAMINE (BENADRYL) 25 MG tablet Take 25 mg by mouth at bedtime as needed. When working with Secretary/administrator, Historical, MD  EPINEPHrine (EPIPEN JR) 0.15 MG/0.3ML injection Inject 0.15 mg into the muscle as needed.    [provider]  gemfibrozil (LOPID) 600 MG tablet Take 600 mg by mouth 2 (two)  times daily.    [provider]  metoprolol tartrate (LOPRESSOR) 25 MG tablet Take 25 mg by mouth 2 (two) times daily.  08/03/12   [provider]      Allergies    Bee venom and Ace inhibitors    Review of Systems   Review of Systems See above Physical Exam Updated Vital Signs BP 120/71   Pulse (!) 104   Temp 98.9 F (37.2 C) (Oral)   Resp (!) 22   Ht 5\' 4"  (1.626 m)   Wt 88.5 kg   SpO2 96%   BMI 33.49 kg/m  Physical Exam Vitals and nursing note reviewed.  Constitutional:      General: He is not in acute  distress.    Appearance: He is well-developed. He is ill-appearing. He is not diaphoretic.  HENT:     Head: Normocephalic and atraumatic.  Eyes:     Conjunctiva/sclera: Conjunctivae normal.  Cardiovascular:     Rate and Rhythm: Tachycardia present. Rhythm irregular.     Heart sounds: Normal heart sounds. No murmur heard.   No friction rub. No gallop.  Pulmonary:     Effort: Pulmonary effort is normal. No respiratory distress.     Breath sounds: Normal breath sounds. No wheezing or rales.  Abdominal:     General: There is no distension.     Palpations: Abdomen is soft.     Tenderness: There is no abdominal tenderness. There is no guarding.  Musculoskeletal:     Cervical back: Normal range of motion.     Right lower leg: Edema present.     Left lower leg: Edema (reports unchanged) present.  Skin:    General: Skin is warm and dry.  Neurological:     Mental Status: He is alert and oriented to person, place, and time.    ED Results / Procedures / Treatments   Labs (all labs ordered are listed, but only abnormal results are displayed) Labs Reviewed  LACTIC ACID, PLASMA - Abnormal; Notable for the following components:      Result Value   Lactic Acid, Venous 2.0 (*)    All other components within normal limits  COMPREHENSIVE METABOLIC PANEL - Abnormal; Notable for the following components:   Glucose, Bld 156 (*)    BUN 30 (*)    Creatinine, Ser 2.11 (*)    Total Bilirubin 1.3 (*)    GFR, Estimated 29 (*)    All other components within normal limits  CBC WITH DIFFERENTIAL/PLATELET - Abnormal; Notable for the following components:   WBC 13.6 (*)    RBC 3.73 (*)    Hemoglobin 12.6 (*)    HCT 37.1 (*)    Neutro Abs 13.1 (*)    Lymphs Abs 0.1 (*)    Abs Immature Granulocytes 0.10 (*)    All other components within normal limits  PROTIME-INR - Abnormal; Notable for the following components:   Prothrombin Time 42.9 (*)    INR 4.6 (*)    All other components within normal  limits  APTT - Abnormal; Notable for the following components:   aPTT 67 (*)    All other components within normal limits  URINALYSIS, ROUTINE W REFLEX MICROSCOPIC - Abnormal; Notable for the following components:   Hgb urine dipstick TRACE (*)    Protein, ur 30 (*)    All other components within normal limits  URINALYSIS, MICROSCOPIC (REFLEX) - Abnormal; Notable for the following components:   Bacteria, UA FEW (*)  All other components within normal limits  BRAIN NATRIURETIC PEPTIDE - Abnormal; Notable for the following components:   B Natriuretic Peptide 843.4 (*)    All other components within normal limits  TROPONIN I (HIGH SENSITIVITY) - Abnormal; Notable for the following components:   Troponin I (High Sensitivity) 44 (*)    All other components within normal limits  TROPONIN I (HIGH SENSITIVITY) - Abnormal; Notable for the following components:   Troponin I (High Sensitivity) 67 (*)    All other components within normal limits  RESP PANEL BY RT-PCR (FLU A&B, COVID) ARPGX2  CULTURE, BLOOD (ROUTINE X 2)  CULTURE, BLOOD (ROUTINE X 2)  URINE CULTURE  LACTIC ACID, PLASMA  MAGNESIUM  PHOSPHORUS    EKG EKG Interpretation  Date/Time:  Wednesday March 27 2022 09:31:24 EDT Ventricular Rate:  98 PR Interval:    QRS Duration: 117 QT Interval:  464 QTC Calculation: 593 R Axis:   44 Text Interpretation: Atrial fibrillation Nonspecific intraventricular conduction delay Inferior infarct, old Lateral leads are also involved No significant change since last tracing Confirmed by Alvira Monday ([id number]) on 03/27/2022 11:35:21 AM  Radiology DG Chest Port 1 View  Result Date: 03/27/2022 CLINICAL DATA:  Provided history: Questionable sepsis-evaluate abnormality. Nausea/vomiting, weakness. Fever. EXAM: PORTABLE CHEST 1 VIEW COMPARISON:  Prior chest radiographs 08/22/2020 and earlier. FINDINGS: Prior median sternotomy. Cardiomegaly. Aortic atherosclerosis. Mild ill-defined opacity within  the bilateral lung bases. No evidence of pleural effusion or pneumothorax. No acute bony abnormality identified. IMPRESSION: Mild ill-defined opacity within the bilateral lung bases. This may reflect atelectasis, however, pneumonia cannot be excluded. Cardiomegaly. Aortic Atherosclerosis (ICD10-I70.0). Electronically Signed   By: Jackey Loge D.O.   On: 03/27/2022 10:20    Procedures Procedures    Medications Ordered in ED Medications  vancomycin (VANCOREADY) IVPB 1250 mg/250 mL (has no administration in time range)  metroNIDAZOLE (FLAGYL) IVPB 500 mg (has no administration in time range)  cefTRIAXone (ROCEPHIN) 1 g in sodium chloride 0.9 % 100 mL IVPB (0 g Intravenous Stopped 03/27/22 1143)  phytonadione (VITAMIN K) tablet 2.5 mg (2.5 mg Oral Given 03/27/22 1112)  vancomycin (VANCOCIN) IVPB 1000 mg/200 mL premix (0 mg Intravenous Stopped 03/27/22 1418)    And  vancomycin (VANCOCIN) 500 mg in sodium chloride 0.9 % 100 mL IVPB (0 mg Intravenous Stopped 03/27/22 1314)    ED Course/ Medical Decision Making/ A&P                           Medical Decision Making Amount and/or Complexity of Data Reviewed Labs: ordered. Radiology: ordered. ECG/medicine tests: ordered.  Risk Prescription drug management. Decision regarding hospitalization.    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   On arrival to the emergency department, he can continue to have a fever of 100.3 despite treatment with Tylenol at home prior to arrival, heart rate  of 138, both placed on 2 L of oxygen.   Concern for possible sepsis on arrival with increased temperature, heart rate, generalized weakness.  Chest x-ray was completed and evaluated by me and showed possible pneumonia.  Urinalysis shows no sign of urinary tract infection.  Lab work was completed and interpreted by me and showed leukocytosis, lactate of 2, INR supratherapeutic at 4.6.  Creatinine similar to prior values.  Has troponin elevation likely in the setting of acute illness, with noted prior similar values.  Clinically on CXR and history do not see signs of worsening CHF, BNP is worse. Given stable BP at this time and CHF history, lactate improving, do not feel 30cc/kg fluid appropriate.  Initially ordered rocephin/azithromycin for possible pneumonia on CXR. Has some cough in the room but denied on history and not clearly the source.  Given history of prior bacteremia, broaden antibiotics to include vancomycin as well as Flagyl.  HR improved after arrival to ED, is in persistent atrial fibrillation.   Given his cardiology team is at [facility], discussed possibility of transfer there, however given concern more for acute infection, and at this time not clearly repeat endocarditis, feel admission to our facility is appropriate, and may consider later transfer as indicated.         Final Clinical Impression(s) / ED Diagnoses Final diagnoses:  Sepsis, due to unspecified organism, unspecified whether acute organ dysfunction present [facility])  Community acquired pneumonia, unspecified laterality    Rx / DC Orders ED Discharge Orders     None         Alvira Monday, MD 03/27/22 1610    Alvira Monday, MD 03/27/22 [phone]

## 2022-03-27 NOTE — ED Notes (Signed)
Report provided to [facility] for transport to [facility] 

## 2022-03-27 NOTE — Progress Notes (Signed)
Plan of Care Note for accepted transfer ? ? ?Patient: Steve Riddle. MRN: [id number]   DOA: 03/27/2022 ? ?Facility requesting transfer: [facility].  ?Requesting Provider: Gareth Morgan, MD ?Reason for transfer: Sepsis due to undetermined organism. ?Facility course:  ?88 year old male with a past medical history of ventricular tachycardia on amiodarone and mexiletine, history of atrial flutter, history of CTI ablation April 2022, history of systolic heart failure with an EF of 45%, CAD, history of mechanical heart valve, history of Enterococcus bacteremia treated empirically with vancomycin who was brought by family members to the emergency department with complaints of generalized weakness for the past 2 to 3 days, which has worsened since last night associated now with nausea and vomiting since last night and early morning.  Patient was febrile with a temperature 101.0 ?F.  He has been treated empirically with vancomycin, ceftriaxone and metronidazole.  ? ?Lab work: ? ? Component Value Units  ?Troponin I (High Sensitivity) [[id number]] (Abnormal)   ?Collected: 03/27/22 0934   ?Updated: 03/27/22 1220   ?Specimen Source: Vein   ? Troponin I (High Sensitivity) 44 High  ng/L  ?Brain natriuretic peptide [[id number]]   ?Collected: 03/27/22 0934   ?Updated: 03/27/22 1201   ?Specimen Type: Blood   ?Lactic acid, plasma [[id number]]   ?Collected: 03/27/22 1128   ?Updated: 03/27/22 1146   ?Specimen Type: Blood   ?Specimen Source: Vein   ? Lactic Acid, Venous 1.8 mmol/L  ?Urinalysis, Microscopic (reflex) [[id number]] (Abnormal)   ?Collected: 03/27/22 1035   ?Updated: 03/27/22 1058   ? RBC / HPF 0-5 RBC/hpf  ? WBC, UA 0-5 WBC/hpf  ? Bacteria, UA FEW Abnormal   ? Squamous Epithelial / LPF 0-5  ? Hyaline Casts, UA PRESENT  ?Urinalysis, Routine w reflex microscopic Urine, Clean Catch [[id number]] (Abnormal)   ?Collected: 03/27/22 1035   ?Updated: 03/27/22 1058   ?Specimen Source: Urine, Clean Catch   ? Color, Urine YELLOW  ?  APPearance CLEAR  ? Specific Gravity, Urine 1.015  ? pH 7.0  ? Glucose, UA NEGATIVE mg/dL  ? Hgb urine dipstick TRACE Abnormal   ? Bilirubin Urine NEGATIVE  ? Ketones, ur NEGATIVE mg/dL  ? Protein, ur 30 Abnormal  mg/dL  ? Nitrite NEGATIVE  ? Leukocytes,Ua NEGATIVE  ?Comprehensive metabolic panel [[id number]] (Abnormal)   ?Collected: 03/27/22 0934   ?Updated: 03/27/22 1056   ?Specimen Type: Blood   ?Specimen Source: Vein   ? Sodium 137 mmol/L  ? Potassium 3.9 mmol/L  ? Chloride 102 mmol/L  ? CO2 25 mmol/L  ? Glucose, Bld 156 High  mg/dL  ? BUN 30 High  mg/dL  ? Creatinine, Ser 2.11 High  mg/dL  ? Calcium 9.8 mg/dL  ? Total Protein 6.5 g/dL  ? Albumin 4.1 g/dL  ? AST 29 U/L  ? ALT 18 U/L  ? Alkaline Phosphatase 66 U/L  ? Total Bilirubin 1.3 High  mg/dL  ? GFR, Estimated 29 Low  mL/min  ? Anion gap 10  ?Blood Culture (routine x 2) [[id number]]   ?Collected: 03/27/22 1035   ?Updated: 03/27/22 1046   ?Specimen Type: Blood   ?Specimen Source: Peripheral   ?Urine Culture [[id number]]   ?Collected: 03/27/22 1035   ?Updated: 03/27/22 1045   ?Specimen Source: Urine, Clean Catch   ?APTT [[id number]] (Abnormal)   ?Collected: 03/27/22 0934   ?Updated: 03/27/22 1036   ?Specimen Type: Blood   ?Specimen Source: Vein   ? aPTT 67 High  seconds  ?Resp Panel by RT-PCR (Flu A&B,  Covid) Nasopharyngeal Swab [[id number]]   ?Collected: 03/27/22 0934   ?Updated: 03/27/22 1035   ?Specimen Type: Nasopharyngeal(NP) swabs in vial transport medium   ?Specimen Source: Nasopharyngeal Swab   ? SARS Coronavirus 2 by RT PCR NEGATIVE  ? Influenza A by PCR NEGATIVE  ? Influenza B by PCR NEGATIVE  ?Protime-INR [[id number]] (Abnormal)   ?Collected: 03/27/22 0934   ?Updated: 03/27/22 1031   ?Specimen Type: Blood   ?Specimen Source: Vein   ? Prothrombin Time 42.9 High  seconds  ? INR 4.6 High Panic    ?Lactic acid, plasma [[id number]] (Abnormal)   ?Collected: 03/27/22 0934   ?Updated: 03/27/22 1014   ?Specimen Type: Blood   ? Lactic Acid, Venous 2.0 High Panic    mmol/L  ?CBC WITH DIFFERENTIAL [[id number]] (Abnormal)   ?Collected: 03/27/22 0934   ?Updated: 03/27/22 1314   ?Specimen Type: Blood   ?Specimen Source: Vein   ? WBC 13.6 High  K/uL  ? RBC 3.73 Low  MIL/uL  ? Hemoglobin 12.6 Low  g/dL  ? HCT 37.1 Low  %  ? MCV 99.5 fL  ? MCH 33.8 pg  ? MCHC 34.0 g/dL  ? RDW 14.8 %  ? Platelets 151 K/uL  ? nRBC 0.0 %  ? Neutrophils Relative % 96 %  ? Neutro Abs 13.1 High  K/uL  ? Lymphocytes Relative 1 %  ? Lymphs Abs 0.1 Low  K/uL  ? Monocytes Relative 2 %  ? Monocytes Absolute 0.2 K/uL  ? Eosinophils Relative 0 %  ? Eosinophils Absolute 0.0 K/uL  ? Basophils Relative 0 %  ? Basophils Absolute 0.0 K/uL  ? Immature Granulocytes 1 %  ? Abs Immature Granulocytes 0.10 High  K/uL  ?Blood Culture (routine x 2) [[id number]]   ?Collected: 03/27/22 0934   ?Updated: 03/27/22 2820   ?Specimen Type: Blood   ?Specimen Source: Peripheral   ? ?Imaging: ?CLINICAL DATA:   ?Provided history: Questionable sepsis-evaluate ?abnormality. Nausea/vomiting, weakness. Fever. ?  ?EXAM: ?PORTABLE CHEST 1 VIEW ?  ?COMPARISON:  Prior chest radiographs 08/22/2020 and earlier. ?  ?FINDINGS: ?Prior median sternotomy. Cardiomegaly. Aortic atherosclerosis. Mild ?ill-defined opacity within the bilateral lung bases. No evidence of ?pleural effusion or pneumothorax. No acute bony abnormality ?identified. ?  ?IMPRESSION: ?Mild ill-defined opacity within the bilateral lung bases. This may ?reflect atelectasis, however, pneumonia cannot be excluded. ?  ?Cardiomegaly. ?  ?Aortic Atherosclerosis (ICD10-I70.0). ? ?Plan of care: ?The patient is accepted for admission to Progressive unit, at [facility]. ? ?Author: ?Reubin Milan, MD ?03/27/2022 ? ?Check [email or web address] for on-call coverage. ? ?Nursing staff, Please call [place] number on Amion as soon as patient's arrival, so appropriate admitting provider can evaluate the pt. ?

## 2022-03-27 NOTE — ED Triage Notes (Signed)
Per family patient has had n/v and weakness that started last night.  Very difficult to get patient out of the car.  Also running a fever.  Max of 101.  Tylenol was given at 0800.   ?

## 2022-03-27 NOTE — Progress Notes (Signed)
Pharmacy Antibiotic Note ? ?Steve Riddle. is a 88 y.o. male admitted on 03/27/2022 with sepsis.  Pharmacy has been consulted for vancomycin dosing. Pt with Tmax 100.3 and WBC is elevated at 13.6. SCr is elevated at 2.11. Lactic acid is down to 1.8.  ? ?Plan: ?Vancomycin '1500mg'$  IV x 1 then '1250mg'$  IV Q48H ?F/u renal fxn, C&S, clinical status and peak/trough at [facility] ?F/u ongoing gram neg coverage ? ?Height: '5\' 4"'$  (162.6 cm) ?Weight: 88.5 kg (195 lb 1.7 oz) ?IBW/kg (Calculated) : 59.2 ? ?Temp (24hrs), Avg:100.3 ?F (37.9 ?C), Min:100.3 ?F (37.9 ?C), Max:100.3 ?F (37.9 ?C) ? ?Recent Labs  ?Lab 03/27/22 ?2800 03/27/22 ?1128  ?WBC 13.6*  --   ?CREATININE 2.11*  --   ?LATICACIDVEN 2.0* 1.8  ?  ?Estimated Creatinine Clearance: 22.9 mL/min (A) (by C-G formula based on SCr of 2.11 mg/dL (H)).   ? ?Allergies  ?Allergen Reactions  ? Bee Venom   ? Ace Inhibitors Cough  ? ? ?Antimicrobials this admission: ?Vanc 4/5>> ?Flagyl 4/5>> ?CTX x 1 4/5 ?Azithro x 1 4/5 ? ?Dose adjustments this admission: ?N/A ? ?Microbiology results: ?Pending ? ?Thank you for allowing pharmacy to be a part of this patient?s care. ? ?Kayela Humphres, Rande Lawman ?03/27/2022 11:52 AM ? ?

## 2022-03-27 NOTE — Progress Notes (Signed)
ANTICOAGULATION CONSULT NOTE - Initial Consult ? ?Pharmacy Consult for Warfarin ?Indication:  mech MVR/AVR ? ?Allergies  ?Allergen Reactions  ? Bee Venom   ? Ace Inhibitors Cough  ? ? ?Patient Measurements: ?Height: '5\' 4"'$  (162.6 cm) ?Weight: 88.5 kg (195 lb 1.7 oz) ?IBW/kg (Calculated) : 59.2 ? ?Vital Signs: ?Temp: 98.9 ?F (37.2 ?C) (04/05 1430) ?Temp Source: Oral (04/05 1430) ?BP: 120/71 (04/05 1500) ?Pulse Rate: 104 (04/05 1500) ? ?Labs: ?Recent Labs  ?  03/27/22 ?7322 03/27/22 ?1415  ?HGB 12.6*  --   ?HCT 37.1*  --   ?PLT 151  --   ?APTT 67*  --   ?LABPROT 42.9*  --   ?INR 4.6*  --   ?CREATININE 2.11*  --   ?TROPONINIHS 44* 67*  ? ? ?Estimated Creatinine Clearance: 22.9 mL/min (A) (by C-G formula based on SCr of 2.11 mg/dL (H)). ? ? ?Medical History: ?Past Medical History:  ?Diagnosis Date  ? Anemia   ? Arthritis   ? Diverticulosis   ? Endocarditis   ? GI bleed   ? Gout   ? Heart murmur   ? Hx of adenomatous colonic polyps   ? Hyperlipemia   ? Hyperparathyroidism ([employer])   ? Hypertension   ? Nephrosclerosis   ? ? ?Assessment: ?Active Problem(s): N/V, weakness ? ?PMH: vtach, aflutter, MVR/AVR, gout, HTN, HLD, anemia, HF with EF 45%, CAD,  history of Enterococcus bacteremia  ? ?AC/Heme: Warfarin PTA ('5mg'$  daily) for mechanical MVR/AVR, - INR 4.6, s/p vit K 2.'5mg'$  PO 4/5. ? ?Goal of Therapy:  ?INR 2.5-3.5 ?Monitor platelets by anticoagulation protocol: Yes ?  ?Plan:  ?Hold Warfarin today ?Check INR daily  ? ?Josefine Fuhr S. Alford Highland, PharmD, BCPS ?Clinical Staff Pharmacist [email or web address] ?Alford Highland, [employer] ?03/27/2022,4:11 PM ? ? ?

## 2022-03-27 NOTE — H&P (Signed)
?History and Physical  ? ? ?Patient: Steve Riddle. QTM:[id number] DOB: Oct 17, 1929 ?DOA: 03/27/2022 ?DOS: the patient was seen and examined on 03/27/2022 ?PCP: [name], MD  ?Patient coming from: Home ? ?Chief Complaint:  ?Chief Complaint  ?Patient presents with  ? Weakness  ? Fever  ? ?HPI: Steve Riddle. is a 88 y.o. male with medical history significant of class I obesity, gout, secondary hyperparathyroidism, paroxysmal ventricular tachycardia on amiodarone and mexiletine, history of atrial flutter, history of CTI ablation April 2022 at [facility], history of systolic heart failure with an EF of 45%, most recently diagnosed with chronic heart failure with preserved EF, hyperlipidemia, hypertension,CAD, history of COVID myocarditis, history of mechanical heart valves, history of Enterococcus bacteremia treated empirically with vancomycin who presented to the emergency department with complaints of progressively worse generalized weakness for the past couple of weeks, although he was able to use the riding lawnmower yesterday, but then developed nausea and vomiting after eating grits for dinner, followed by fever since last night and more episodes of nausea and vomiting.  No diarrhea, abdominal pain, melena or hematochezia.  No flank pain, dysuria, frequency or hematuria.  The patient has not had any trouble breathing until today when he arrived to the emergency department and require [place] oxygen at 2 LPM.  However, the time of arrival to the progressive care unit his oxygen requirement had increased to 5 LPM.  His son [name] also described that he had a transient episode of confusion earlier, but is returning to baseline now.  He is able to answer simple questions.  He is oriented x3 with a little difficulty.  There is no headache, chest, back or abdominal pain at the moment.  I was able to speak to his daughter [name] who was able to provide information and confirm what her brother told me earlier. ? ?ED course:  Initial vital signs were temperature  ?  ?Review of Systems: As mentioned in the history of present illness. All other systems reviewed and are negative. ?Past Medical History:  ?Diagnosis Date  ? Anemia   ? Arthritis   ? Diverticulosis   ? Endocarditis   ? GI bleed   ? Gout   ? Heart murmur   ? Hx of adenomatous colonic polyps   ? Hyperlipemia   ? Hyperparathyroidism ([place])   ? Hypertension   ? Nephrosclerosis   ? ?Past Surgical History:  ?Procedure Laterality Date  ? AORTIC AND MITRAL VALVE REPLACEMENT    ? CATARACT EXTRACTION Left   ? with lens implant  ? CORONARY ARTERY BYPASS GRAFT    ? MITRAL VALVE REPLACEMENT    ? ORIF ARM    ? ?Social History:  reports that he has quit smoking. He has never used smokeless tobacco. He reports that he does not drink alcohol and does not use drugs. ? ?Allergies  ?Allergen Reactions  ? Bee Venom Anaphylaxis  ? Ace Inhibitors Cough  ? Atorvastatin Other (See Comments)  ?  Muscle Pain  ? Pravastatin Other (See Comments)  ?  Muscle Pain  ? Ranexa [Ranolazine] Other (See Comments)  ?  Tachycardia  ? ? ?Family History  ?Problem Relation Age of Onset  ? Hypertension Mother   ? Stroke Mother   ? Hypertension Father   ? Breast cancer Daughter   ? Uterine cancer Daughter   ? Liver cancer Brother 25  ? ? ?Prior to Admission medications   ?Medication Sig Start Date End Date Taking? Authorizing Provider  ?  acetaminophen (TYLENOL) 650 MG CR tablet Take 650 mg by mouth every 8 (eight) hours as needed.    [provider]  ?allopurinol (ZYLOPRIM) 100 MG tablet Take 400 mg by mouth daily.    [provider]  ?bumetanide (BUMEX) 2 MG tablet Take 2 tablets by mouth in the morning and 1 tablet in the evening    [provider]  ?calcitRIOL (ROCALTROL) 0.5 MCG capsule Take one tablet by mouth daily 08/10/12   [provider]  ?cetirizine (ZYRTEC) 10 MG tablet Take 10 mg by mouth daily.    [provider]  ?diphenhydrAMINE (BENADRYL) 25 MG tablet Take 25 mg  by mouth at bedtime as needed. When working with Loss adjuster, chartered, Historical, MD  ?EPINEPHrine (EPIPEN JR) 0.15 MG/0.3ML injection Inject 0.15 mg into the muscle as needed.    [provider]  ?fish oil-omega-3 fatty acids 1000 MG capsule Take 1 g by mouth daily.    [provider]  ?gemfibrozil (LOPID) 600 MG tablet Take 600 mg by mouth 2 (two) times daily.    [provider]  ?glucosamine-chondroitin 500-400 MG tablet Take 1 tablet by mouth daily.     [provider]  ?metoprolol tartrate (LOPRESSOR) 25 MG tablet Take 25 mg by mouth 2 (two) times daily.  08/03/12   [provider]  ?Multiple Vitamins-Minerals (MULTIVITAMIN WITH MINERALS) tablet Take 1 tablet by mouth daily.    [provider]  ?omeprazole (PRILOSEC) 20 MG capsule Take 1 capsule (20 mg total) by mouth daily. 06/20/15   [place] Dragon, MD  ?potassium chloride SA (K-DUR,KLOR-CON) 20 MEQ tablet Take 20 mEq by mouth 2 (two) times daily.    [provider]  ?pravastatin (PRAVACHOL) 40 MG tablet Take 80 mg by mouth daily.     [provider]  ?tamsulosin (FLOMAX) 0.4 MG CAPS capsule Take 0.4 mg by mouth daily.    [provider]  ?warfarin (COUMADIN) 5 MG tablet Take 5 mg by mouth daily.     [provider]  ? ? ?Physical Exam: ?Vitals:  ? 03/27/22 1500 03/27/22 1613 03/27/22 1622 03/27/22 1714  ?BP: 120/71 (!) 98/59  107/72  ?Pulse: (!) 104 (!) 102  (!) 104  ?Resp: (!) 22 (!) 32  (!) 30  ?Temp:  (!) 101.8 ?F (38.8 ?C)  (!) 100.9 ?F (38.3 ?C)  ?TempSrc:  Oral  Oral  ?SpO2: 96% 94%  96%  ?Weight:   86.2 kg   ?Height:   '5\' 4"'$  (1.626 m)   ? ?Physical Exam ?Vitals and nursing note reviewed.  ?Constitutional:   ?   Appearance: Normal appearance. He is obese.  ?HENT:  ?   Head: Normocephalic.  ?   Mouth/Throat:  ?   Mouth: Mucous membranes are moist.  ?Eyes:  ?   Pupils: Pupils are equal, round, and reactive to light.  ?Neck:  ?   Vascular: No JVD.  ?Cardiovascular:  ?    Rate and Rhythm: Normal rate and regular rhythm.  ?   Heart sounds: S1 normal and S2 normal.  ?Pulmonary:  ?   Breath sounds: Examination of the right-lower field reveals rales. Examination of the left-lower field reveals rales. Rales present.  ?Abdominal:  ?   General: There is no distension.  ?   Palpations: Abdomen is soft.  ?   Tenderness: There is no abdominal tenderness. There is no guarding.  ?Musculoskeletal:  ?   Cervical back: Neck supple.  ?  Right lower leg: No edema.  ?   Left lower leg: No edema.  ?Skin: ?   General: Skin is warm and dry.  ?   Comments: Decreased skin turgor.  ?Neurological:  ?   General: No focal deficit present.  ?   Mental Status: He is alert and oriented to person, place, and time.  ?Psychiatric:     ?   Mood and Affect: Mood normal.     ?   Behavior: Behavior normal.  ? ? ?Data Reviewed: ? ?There are no new results to review at this time. ? ?Assessment and Plan: ?Principal Problem: ?  Sepsis due to undetermined organism POA ([place]) ?Aspiration pneumonia versus bacteremia?Marland Kitchen ?Admit to PCU/inpatient. ?Continue supplemental oxygen. ?Slow infused LR bolus. ?Time-limited IV fluids. ?Continue vancomycin per pharmacy. ?Continue ceftriaxone 1 g IVPB daily. ?Continue metronidazole 500 mg IVPB every 12 hours. ?Follow-up blood culture and sensitivity. ? ?Active Problems: ?  Acute respiratory failure with hypoxia ([place]) ?Aspiration pneumonia suspected in the setting of emesis. ?Continue supplemental oxygen. ?BiPAP as needed. ?Levalbuterol nebs as needed. ?Continue ceftriaxone and metronidazole. ?Follow-up blood culture and sensitivity. ? ?  S/P MVR  ?  S/P AVR ?  Supratherapeutic INR ?Hold warfarin. ?Consult pharmacy for future dosing. ? ?  Elevated troponin ?No anginal symptoms. ?No significant uptrend after 4 hours. ?This is likely demand ischemia. ? ?  Hyperlipidemia ?No longer taking a statin. ? ?  Hypertension ?Blood pressures are soft. ?Continue IV fluids. ?As needed antihypertensive. ? ?   Class 1 obesity ?Lifestyle modifications. ?Follow-up with PCP. ? ?  Chronic gouty arthritis ?Continue allopurinol 400 mg p.o. daily. ? ?  Chronic heart failure with preserved ejection fraction ([place]) ?

## 2022-03-28 ENCOUNTER — Inpatient Hospital Stay (HOSPITAL_COMMUNITY): Payer: Medicare Other

## 2022-03-28 DIAGNOSIS — R6520 Severe sepsis without septic shock: Secondary | ICD-10-CM

## 2022-03-28 DIAGNOSIS — B955 Unspecified streptococcus as the cause of diseases classified elsewhere: Secondary | ICD-10-CM | POA: Diagnosis present

## 2022-03-28 DIAGNOSIS — R7881 Bacteremia: Secondary | ICD-10-CM | POA: Diagnosis not present

## 2022-03-28 DIAGNOSIS — J9601 Acute respiratory failure with hypoxia: Secondary | ICD-10-CM

## 2022-03-28 DIAGNOSIS — I330 Acute and subacute infective endocarditis: Secondary | ICD-10-CM

## 2022-03-28 DIAGNOSIS — I4891 Unspecified atrial fibrillation: Secondary | ICD-10-CM | POA: Diagnosis not present

## 2022-03-28 DIAGNOSIS — R1310 Dysphagia, unspecified: Secondary | ICD-10-CM | POA: Diagnosis not present

## 2022-03-28 DIAGNOSIS — A409 Streptococcal sepsis, unspecified: Secondary | ICD-10-CM | POA: Diagnosis not present

## 2022-03-28 DIAGNOSIS — D696 Thrombocytopenia, unspecified: Secondary | ICD-10-CM

## 2022-03-28 DIAGNOSIS — N184 Chronic kidney disease, stage 4 (severe): Secondary | ICD-10-CM | POA: Diagnosis present

## 2022-03-28 LAB — BLOOD CULTURE ID PANEL (REFLEXED) - BCID2

## 2022-03-28 LAB — URINE CULTURE

## 2022-03-28 LAB — ECHOCARDIOGRAM COMPLETE
AR max vel: 1.67 cm2
AV Area VTI: 1.68 cm2
AV Area mean vel: 1.69 cm2
AV Mean grad: 15 mmHg
AV Peak grad: 24.9 mmHg
Ao pk vel: 2.5 m/s
Area-P 1/2: 4.08 cm2
Calc EF: 24.5 %
Height: 64 in
MV VTI: 2.35 cm2
S' Lateral: 5.7 cm
Single Plane A2C EF: 27.2 %
Single Plane A4C EF: 23.2 %
Weight: 3040 oz

## 2022-03-28 LAB — EXPECTORATED SPUTUM ASSESSMENT W GRAM STAIN, RFLX TO RESP C

## 2022-03-28 LAB — COMPREHENSIVE METABOLIC PANEL
ALT: 32 U/L (ref 0–44)
AST: 68 U/L — ABNORMAL HIGH (ref 15–41)
Albumin: 3 g/dL — ABNORMAL LOW (ref 3.5–5.0)
Alkaline Phosphatase: 42 U/L (ref 38–126)
Anion gap: 8 (ref 5–15)
BUN: 33 mg/dL — ABNORMAL HIGH (ref 8–23)
CO2: 26 mmol/L (ref 22–32)
Calcium: 8.9 mg/dL (ref 8.9–10.3)
Chloride: 103 mmol/L (ref 98–111)
Creatinine, Ser: 1.93 mg/dL — ABNORMAL HIGH (ref 0.61–1.24)
GFR, Estimated: 32 mL/min — ABNORMAL LOW (ref >60)
Glucose, Bld: 104 mg/dL — ABNORMAL HIGH (ref 70–99)
Potassium: 4.3 mmol/L (ref 3.5–5.1)
Sodium: 137 mmol/L (ref 135–145)
Total Bilirubin: 0.7 mg/dL (ref 0.3–1.2)
Total Protein: 5.3 g/dL — ABNORMAL LOW (ref 6.5–8.1)

## 2022-03-28 LAB — CBC WITH DIFFERENTIAL/PLATELET
Abs Immature Granulocytes: 0.03 10*3/uL (ref 0.00–0.07)
Basophils Absolute: 0 10*3/uL (ref 0.0–0.1)
Basophils Relative: 0 %
Eosinophils Absolute: 0 10*3/uL (ref 0.0–0.5)
Eosinophils Relative: 0 %
HCT: 36.3 % — ABNORMAL LOW (ref 39.0–52.0)
Hemoglobin: 11.8 g/dL — ABNORMAL LOW (ref 13.0–17.0)
Immature Granulocytes: 0 %
Lymphocytes Relative: 3 %
Lymphs Abs: 0.3 10*3/uL — ABNORMAL LOW (ref 0.7–4.0)
MCH: 33.5 pg (ref 26.0–34.0)
MCHC: 32.5 g/dL (ref 30.0–36.0)
MCV: 103.1 fL — ABNORMAL HIGH (ref 80.0–100.0)
Monocytes Absolute: 0.2 10*3/uL (ref 0.1–1.0)
Monocytes Relative: 2 %
Neutro Abs: 9.1 10*3/uL — ABNORMAL HIGH (ref 1.7–7.7)
Neutrophils Relative %: 95 %
Platelets: 129 10*3/uL — ABNORMAL LOW (ref 150–400)
RBC: 3.52 MIL/uL — ABNORMAL LOW (ref 4.22–5.81)
RDW: 14.8 % (ref 11.5–15.5)
WBC: 9.6 10*3/uL (ref 4.0–10.5)
nRBC: 0 % (ref 0.0–0.2)

## 2022-03-28 LAB — PROTIME-INR
INR: 3.1 — ABNORMAL HIGH (ref 0.8–1.2)
Prothrombin Time: 31.9 seconds — ABNORMAL HIGH (ref 11.4–15.2)

## 2022-03-28 MED ORDER — WARFARIN - PHARMACIST DOSING INPATIENT: Freq: Every day | Status: DC

## 2022-03-28 MED ORDER — SODIUM CHLORIDE 0.9 % IV SOLN
2.0000 g | INTRAVENOUS | Status: DC
  Administered 2022-03-28: 2 g via INTRAVENOUS
  Filled 2022-03-28 (×2): qty 20

## 2022-03-28 MED ORDER — WARFARIN SODIUM 6 MG PO TABS
6.0000 mg | ORAL_TABLET | Freq: Once | ORAL | Status: AC
  Administered 2022-03-28: 6 mg via ORAL
  Filled 2022-03-28: qty 1

## 2022-03-28 NOTE — Hospital Course (Addendum)
40 yoM. W/ Class I obesity, gout, secondary hyperparathyroidism,paroxysmal ventricular tachycardia on amiodarone and mexiletine, history of atrial flutter, history of CTI ablation April 2022 at [facility], history of systolic heart failure with an EF of 45%, most recently diagnosed with chronic heart failure with preserved EF, hyperlipidemia, hypertension,CAD, history of COVID myocarditis, history of mechanical heart valves, history of Enterococcus bacteremia treated empirically with vancomycin presented with generalized weakness progressively worse for couple of weeks, developed nausea and vomiting after eating grits for dinner followed by fever and more episodes of nausea vomiting, so came to the ED for evaluation where he was also short of breath placed on 2 L nasal cannula.   ?Son reports he had transient episodes of confusion earlier but at baseline in the ED. In the ED further work-up showed sepsis with fever of one 101.8, elevated creatinine 2.1 BNP 843, troponin  44> 67, lactic acidosis 2.0> 1.8, leukocytosis WBC 13.6, INR 4.6 UA with normal WBC BCID positive Streps-High grade strep gallolyticus bacteremia.  ID following, needs TEE ?

## 2022-03-28 NOTE — Progress Notes (Signed)
ANTICOAGULATION CONSULT NOTE ? ?Pharmacy Consult for Warfarin ?Indication: mechanical MVR/AVR, atrial flutter  ? ?Allergies  ?Allergen Reactions  ? Bee Venom Anaphylaxis  ? Ace Inhibitors Cough  ? Atorvastatin Other (See Comments)  ?  Muscle Pain  ? Pravastatin Other (See Comments)  ?  Muscle Pain  ? Ranexa [Ranolazine] Other (See Comments)  ?  Tachycardia  ? ? ?Patient Measurements: ?Height: '5\' 4"'$  (162.6 cm) ?Weight: 86.2 kg (190 lb) ?IBW/kg (Calculated) : 59.2 ? ?Vital Signs: ?Temp: 97.6 ?F (36.4 ?C) (04/06 1213) ?Temp Source: Oral (04/06 1213) ?BP: 102/65 (04/06 1213) ?Pulse Rate: 94 (04/06 1213) ? ?Labs: ?Recent Labs  ?  03/27/22 ?2841 03/27/22 ?1415 03/28/22 ?0422  ?HGB 12.6*  --  11.8*  ?HCT 37.1*  --  36.3*  ?PLT 151  --  129*  ?APTT 67*  --   --   ?LABPROT 42.9*  --  31.9*  ?INR 4.6*  --  3.1*  ?CREATININE 2.11*  --  1.93*  ?TROPONINIHS 44* 67*  --   ? ? ? ?Estimated Creatinine Clearance: 24.7 mL/min (A) (by C-G formula based on SCr of 1.93 mg/dL (H)). ? ? ?Medical History: ?Past Medical History:  ?Diagnosis Date  ? Anemia   ? Arthritis   ? Diverticulosis   ? Endocarditis   ? GI bleed   ? Gout   ? Heart murmur   ? Hx of adenomatous colonic polyps   ? Hyperlipemia   ? Hyperparathyroidism ([place])   ? Hypertension   ? Nephrosclerosis   ? ? ?Assessment: ?54 y/oM with PMH of vtach, aflutter, MVR/AVR, gout, HTN, HLD, anemia, HF with EF 45%, CAD,  history of Enterococcus bacteremia who presented to [facility] ED with n/v, weakness, fever. INR on admission 4.6. Vitamin K 2.'5mg'$  PO was given per EDP on 4/5 at 1112. Pharmacy consulted for warfarin dosing this admission. ? ?PTA warfarin regimen: '3mg'$  daily except '6mg'$  on Fridays (regimen confirmed with [place] at Dr. Buel Ream office on 4/6) ? ?Today, 03/28/22:  ?INR 3.1 ?CBC: Hgb decreased to 11.8. Pltc decreased to 129K ?No bleeding issues noted ?PO intake: per RN, ate 100% of breakfast, 10% of lunch ? ?Goal of Therapy:  ?INR 2.5-3.5  (INR goal confirmed with Dr. Buel Ream  office) ?Monitor platelets by anticoagulation protocol: Yes ?  ?Plan:  ?Warfarin '6mg'$  PO x 1 today ?Daily PT/INR  ?CBC at least q72h ?Monitor closely for s/sx of bleeding ? ? ?Lindell Spar, PharmD, BCPS ?Clinical Pharmacist ?03/28/2022,12:16 PM ?

## 2022-03-28 NOTE — Progress Notes (Signed)
?   03/28/22 1213  ?Assess: MEWS Score  ?Temp 97.6 ?F (36.4 ?C)  ?BP 102/65  ?Pulse Rate 94  ?Resp (!) 30  ?Level of Consciousness Alert  ?SpO2 96 %  ?O2 Device Nasal Cannula  ?O2 Flow Rate (L/min) 2 L/min  ?Assess: MEWS Score  ?MEWS Temp 0  ?MEWS Systolic 0  ?MEWS Pulse 0  ?MEWS RR 2  ?MEWS LOC 0  ?MEWS Score 2  ?MEWS Score Color Yellow  ?Assess: if the MEWS score is Yellow or Red  ?Were vital signs taken at a resting state? Yes  ?Focused Assessment No change from prior assessment  ?Does the patient meet 2 or more of the SIRS criteria? Yes  ?Does the patient have a confirmed or suspected source of infection? Yes  ?Provider and Rapid Response Notified? No  ?MEWS guidelines implemented *See Row Information* No, previously yellow, continue vital signs every 4 hours  ?Notify: Charge Nurse/RN  ?Name of Charge Nurse/RN Notified Tye Savoy, RN  ?Date Charge Nurse/RN Notified 03/28/22  ?Time Charge Nurse/RN Notified 1215  ?Assess: SIRS CRITERIA  ?SIRS Temperature  0  ?SIRS Pulse 1  ?SIRS Respirations  1  ?SIRS WBC 0  ?SIRS Score Sum  2  ? ? ?

## 2022-03-28 NOTE — Progress Notes (Signed)
?PROGRESS NOTE ?Steve Riddle.  XFG:[id number] DOB: 1929-03-09 DOA: 03/27/2022 ?PCP: [name], MD  ? ?Brief Narrative/Hospital Course: ?70 yoM. W/ Class I obesity, gout, secondary hyperparathyroidism,paroxysmal ventricular tachycardia on amiodarone and mexiletine, history of atrial flutter, history of CTI ablation April 2022 at [facility], history of systolic heart failure with an EF of 45%, most recently diagnosed with chronic heart failure with preserved EF, hyperlipidemia, hypertension,CAD, history of COVID myocarditis, history of mechanical heart valves, history of Enterococcus bacteremia treated empirically with vancomycin presented with generalized weakness progressively worse for couple of weeks, developed nausea and vomiting after eating grits for dinner followed by fever and more episodes of nausea vomiting, so came to the ED for evaluation where he was also short of breath placed on 2 L nasal cannula.   ?Son reports he had transient episodes of confusion earlier but at baseline in the ED. In the ED further work-up showed sepsis with fever of one 101.8, elevated creatinine 2.1 BNP 843, troponin  44> 67, lactic acidosis 2.0> 1.8, leukocytosis WBC 13.6, INR 4.6 UA with normal WBC BCID positive Streps. ? ?Subjective: ?Seen and examined this morning.  Reports he feels much improved today denies nausea vomiting shortness of breath.Took his oxygen off and was doing well on room air. ?Tmax one 101.8 at 4 pm 4/5. ? ?Assessment and Plan: ? ?Streptococcal sepsis, unspecified: Patient presented with sepsis with fever/tachycardia leukocytosis, BC ID positive with Streptococcus 4/4.Chest x-ray with "mild ill-defined opacity within the bilateral lung bases"-not convincing for pneumonia.  Previous history of enterococcal endocarditis in 2021, and with 2 mechanical valves high risk for endocarditis, ID consulted.  Pharmacy adjusting antibiotics changed to ceftriaxone ? ?CAD ?Hyperlipidemia ?Hypertension ?Mildly elevated  troponin-demand ischemia: ?This morning no chest pain.  Blood pressure is controlled.  Troponin was relatively flat-suspected demand ischemia.continue on his Coumadin. ? ?Chronic heart failure with preserved ejection fraction: This morning appears compensated.  Monitor fluid status closely.  Continue his home dose of Bumex. ? ?Paroxysmal ventricular tachycardia  ?Atrial fibrillation: ?S/P MVR ?S/P AVR: ?Previous history of endocarditis. ?Continue Coumadin pharmacy managing.  Continue antibiotics as #1. ? ?Chronic gouty arthritis: Stable continue allopurinol ?Secondary hyperparathyroidism: Monitor calcium p.o. continue calcitriol ? ?Supratherapeutic INR: Pharmacy to dose Coumadin ?Recent Labs  ?Lab 03/27/22 ?[id number] 03/28/22 ?0422  ?INR 4.6* 3.1*  ?  ? ?Acute respiratory failure with hypoxia: Placed on supplemental oxygen on admission currently doing well on room air. ? ?Thrombocytopenia: From sepsis.  Monitor ?Recent Labs  ?Lab 03/27/22 ?[id number] 03/28/22 ?0422  ?PLT 151 129*  ?  ?CKD stage 4: Creatinine appears to be at baseline. ?Recent Labs  ?Lab 03/27/22 ?0175 03/28/22 ?0422  ?BUN 30* 33*  ?CREATININE 2.11* 1.93*  ?  ? ?Class I Obesity:Patient's Body mass index is 32.61 kg/m?. : Will benefit with PCP follow-up, weight loss  healthy lifestyle and outpatient sleep evaluation. ? ?DVT prophylaxis: coumadin ?Code Status:   Code Status: Full Code ?Family Communication: plan of care discussed with patient  at bedside. ?Patient status is: Inpatient level of care: Progressive  ?Remains inpatient because: Ongoing management of sepsis with IV antibiotics and further work-up ?Patient currently not stable ? ?Dispo: The patient is from: Home ?           Anticipated disposition: TBD ? ?Mobility Assessment (last 72 hours)   ? ? Mobility Assessment   ? ? [place] Name 03/28/22 0847 03/27/22 1703  ?  ?  ?  ? Does patient have an order for bedrest or is  patient medically unstable No - Continue assessment No - Continue assessment     ? What is  the highest level of mobility based on the progressive mobility assessment? Level 1 (Bedfast) - Unable to balance while sitting on edge of bed Level 1 (Bedfast) - Unable to balance while sitting on edge of bed     ? Is the above level different from baseline mobility prior to current illness? Yes - Recommend PT order --     ? ?  ?  ? ?  ? ?Objective: ?Vitals last 24 hrs: ?Vitals:  ? 2022-04-10 1915 April 10, 2022 2019 03/28/22 0100 03/28/22 0828  ?BP: 92/60 98/74 (!) 102/56 98/71  ?Pulse: 91 60 96 89  ?Resp: '18 20 20 20  '$ ?Temp: 99.4 ?F (37.4 ?C) 98.5 ?F (36.9 ?C) 99.5 ?F (37.5 ?C) 97.8 ?F (36.6 ?C)  ?TempSrc: Oral Oral Oral Oral  ?SpO2: 93% 91% 90% 94%  ?Weight:      ?Height:      ? ?Weight change:  ? ?Physical Examination: ?General exam: AA0X3,older than stated age, weak appearing. ?HEENT:Oral mucosa moist, Ear/Nose WNL grossly, dentition normal. ?Respiratory system: bilaterally diminished BS, no use of accessory muscle ?Cardiovascular system: S1 & S2 +, No JVD,. ?Gastrointestinal system: Abdomen soft,NT,ND, BS+ ?Nervous System:Alert, awake, moving extremities and grossly nonfocal ?Extremities: LE edema ,distal peripheral pulses palpable.  ?Skin: No rashes,no icterus. ?MSK: Normal muscle bulk,tone, power ? ?Medications reviewed:  ?Scheduled Meds: ? allopurinol  400 mg Oral Daily  ? amiodarone  200 mg Oral Daily  ? bumetanide  2 mg Oral q1800  ? bumetanide  4 mg Oral Q breakfast  ? calcitRIOL  0.5 mcg Oral Daily  ? loratadine  10 mg Oral Daily  ? mouth rinse  15 mL Mouth Rinse BID  ? mexiletine  200 mg Oral TID  ? pantoprazole  40 mg Oral Daily  ? potassium chloride SA  20 mEq Oral BID  ? tamsulosin  0.4 mg Oral Daily  ? ?Continuous Infusions: ? cefTRIAXone (ROCEPHIN)  IV    ?  ?Diet Order   ? ?       ?  Diet Heart Room service appropriate? Yes; Fluid consistency: Thin  Diet effective now       ?  ? ?  ?  ? ?  ? ? ?Unresulted Labs (From admission, onward)  ? ?  Start     Ordered  ? 03/28/22 0500  CBC with Differential   Daily,   R     ? Apr 10, 2022 1617  ? 03/28/22 0500  Protime-INR  Daily,   R     ? 2022/04/10 1617  ? 04-10-22 1035  Urine Culture  Once,   R       ? 04-10-22 1035  ? 04-10-2022 0931  Urine Culture  (Septic presentation on arrival (screening labs, nursing and treatment orders for obvious sepsis))  ONCE - STAT,   STAT       ?Question:  Indication  Answer:  Dysuria  ? 2022-04-10 0932  ? ?  ?  ? ?  ?Data Reviewed: I have personally reviewed following labs and imaging studies ?CBC: ?Recent Labs  ?Lab 04/10/2022 ?[id number] 03/28/22 ?0422  ?WBC 13.6* 9.6  ?NEUTROABS 13.1* 9.1*  ?HGB 12.6* 11.8*  ?HCT 37.1* 36.3*  ?MCV 99.5 103.1*  ?PLT 151 129*  ? ?Basic Metabolic Panel: ?Recent Labs  ?Lab 2022/04/10 ?[id number] 04/10/2022 ?1300 03/28/22 ?0422  ?NA 137  --  137  ?K 3.9  --  4.3  ?  CL 102  --  103  ?CO2 25  --  26  ?GLUCOSE 156*  --  104*  ?BUN 30*  --  33*  ?CREATININE 2.11*  --  1.93*  ?CALCIUM 9.8  --  8.9  ?MG  --  1.9  --   ?PHOS  --  2.6  --   ? ?GFR: ?Estimated Creatinine Clearance: 24.7 mL/min (A) (by C-G formula based on SCr of 1.93 mg/dL (H)). ?Liver Function Tests: ?Recent Labs  ?Lab 03/27/22 ?[id number] 03/28/22 ?0422  ?AST 29 68*  ?ALT 18 32  ?ALKPHOS 66 42  ?BILITOT 1.3* 0.7  ?PROT 6.5 5.3*  ?ALBUMIN 4.1 3.0*  ? ?No results for input(s): LIPASE, AMYLASE in the last 168 hours. ?No results for input(s): AMMONIA in the last 168 hours. ?Coagulation Profile: ?Recent Labs  ?Lab 03/27/22 ?1191 03/28/22 ?0422  ?INR 4.6* 3.1*  ? ?Recent Labs  ?Lab 03/27/22 ?[id number] 03/27/22 ?1128  ?LATICACIDVEN 2.0* 1.8  ? ? ?Recent Results (from the past 240 hour(s))  ?Resp Panel by RT-PCR (Flu A&B, Covid) Nasopharyngeal Swab     Status: None  ? Collection Time: 03/27/22  9:34 AM  ? Specimen: Nasopharyngeal Swab; Nasopharyngeal(NP) swabs in vial transport medium  ?Result Value Ref Range Status  ? SARS Coronavirus 2 by RT PCR NEGATIVE NEGATIVE Final  ?  Comment: (NOTE) ?SARS-CoV-2 target nucleic acids are NOT DETECTED. ? ?The SARS-CoV-2 RNA is generally detectable in  upper respiratory ?specimens during the acute phase of infection. The lowest ?concentration of SARS-CoV-2 viral copies this assay can detect is ?138 copies/mL. A negative result does not preclude SARS-Cov-2

## 2022-03-28 NOTE — Progress Notes (Signed)
PHARMACY - PHYSICIAN COMMUNICATION ?CRITICAL VALUE ALERT - BLOOD CULTURE IDENTIFICATION (BCID) ? ?Steve Riddle. is an 88 y.o. male who presented to [facility] on 03/27/2022 with a chief complaint of progressively worse generalized weakness. ? ?Assessment:   GPC, 4/4, strep species ? ?Name of physician (or Provider) Contacted: Clarene Essex ? ?Current antibiotics: CTX 1gm IV q24h, flagyl '500mg'$  IV q12h, vancomycin '1250mg'$  IV q48h ? ?Changes to prescribed antibiotics recommended:  ?Patient is on recommended antibiotics - No changes needed ? ?Results for orders placed or performed during the hospital encounter of 08/22/20  ?Blood Culture ID Panel (Reflexed) (Collected: 08/22/2020  2:20 PM)  ?Result Value Ref Range  ? Enterococcus faecalis NOT DETECTED NOT DETECTED  ? Enterococcus Faecium NOT DETECTED NOT DETECTED  ? Listeria monocytogenes NOT DETECTED NOT DETECTED  ? Staphylococcus species DETECTED (A) NOT DETECTED  ? Staphylococcus aureus (BCID) NOT DETECTED NOT DETECTED  ? Staphylococcus epidermidis NOT DETECTED NOT DETECTED  ? Staphylococcus lugdunensis NOT DETECTED NOT DETECTED  ? Streptococcus species NOT DETECTED NOT DETECTED  ? Streptococcus agalactiae NOT DETECTED NOT DETECTED  ? Streptococcus pneumoniae NOT DETECTED NOT DETECTED  ? Streptococcus pyogenes NOT DETECTED NOT DETECTED  ? A.calcoaceticus-baumannii NOT DETECTED NOT DETECTED  ? Bacteroides fragilis NOT DETECTED NOT DETECTED  ? Enterobacterales NOT DETECTED NOT DETECTED  ? Enterobacter cloacae complex NOT DETECTED NOT DETECTED  ? Escherichia coli NOT DETECTED NOT DETECTED  ? Klebsiella aerogenes NOT DETECTED NOT DETECTED  ? Klebsiella oxytoca NOT DETECTED NOT DETECTED  ? Klebsiella pneumoniae NOT DETECTED NOT DETECTED  ? Proteus species NOT DETECTED NOT DETECTED  ? Salmonella species NOT DETECTED NOT DETECTED  ? Serratia marcescens NOT DETECTED NOT DETECTED  ? Haemophilus influenzae NOT DETECTED NOT DETECTED  ? Neisseria meningitidis NOT DETECTED NOT  DETECTED  ? Pseudomonas aeruginosa NOT DETECTED NOT DETECTED  ? Stenotrophomonas maltophilia NOT DETECTED NOT DETECTED  ? Candida albicans NOT DETECTED NOT DETECTED  ? Candida auris NOT DETECTED NOT DETECTED  ? Candida glabrata NOT DETECTED NOT DETECTED  ? Candida krusei NOT DETECTED NOT DETECTED  ? Candida parapsilosis NOT DETECTED NOT DETECTED  ? Candida tropicalis NOT DETECTED NOT DETECTED  ? Cryptococcus neoformans/gattii NOT DETECTED NOT DETECTED  ? ? ?Dolly Rias RPh ?03/28/2022, 12:05 AM ? ?

## 2022-03-28 NOTE — Consult Note (Signed)
? ?                                            Consultation Note ? ? ?Referring Provider: [facility] ?PCP: Steve Lopes, MD ?Primary Gastroenterologist: Steve Cellar, MD ?Reason for consultation: Dysphagia  ?Hospital Day: 2 ? ? ?Assessment  ? ?88 yo male with paroxysmal ventricular tachycardia, atrial flutter, mechanical valve on chronic warfarin and multiple other medical problems resented to ED with nausea, vomiting, weakness. Admitted with streptococcal sepsis. ID following ? ?Dysphagia, not clear if esophageal , transfer dysphagia or combination of both. History from patient is vague but describes regurgitation of food sometimes. Today he "choked" on stew beef. Spoke with wife who gives a 10 year history of ''strangling", more commonly with liquids which sounds more like transfer dysphagia. ? ?Nausea / vomiting. Etiology unclear. Perhaps this is the regurgitation patient describes ? ?See PMH for additional medical problems ? ?Plan  ? ?Will obtain barium swallow with tablet.  ?If above negative can proceed with TEE and we can further evaluate for possible transfer dysphagia  (with modified barium swallow) at later date ? ? ?History of Present Illness  ? ?[facility]. is a 88 y.o. male known to Dr. Havery [facility] with a past medical history significant for remote duodenitis, small bowel AVMs, colon polyps, HTN, HLD, chronic systolic heart failure, paroxysmal ventricular tachycardia, mitral valve replacement, aortic valve replacement, history of endocarditis,  gout, hyperparathyroidism, See PMH for any additional medical problems. ?[facility] was last seen in our office in 2016 for evaluation of positive Cologuard. He had had a colonoscopy at [facility] in [facility] and polyps were reportedly found but removed as he was anticoagulated. His hgb was normal, he was asymptomatic. Since he was at increased risk fof thromboss of valve if anticoagulation was held we decided to first obtain the colonoscopy report  from [place]. Not sure if about the follow up after that.  ? ?Patient admitted yesterday with weakness, nausea / vomiting.  ?ID following for streptococcal sepsis. Being evaluated for endocarditis. Plan is for TEE but that is on hold due to reported swallowing problems. He had a supratherapeutic INR on admission ( 4.6), down to 3.1 today.  ? ?[facility] says she wasn't really having any problems swallowing prior to today. He does describe regurgitation of food sometimes. Daughter at bedside, says wife may have additional details to share Spoke with wife [facility]. They have been married for years and he has had episodes of  ?"Strangling' since then. She doesn't think he has problems with food sticking in esophagus. He may cough sometimes when drinking liquids. Otherwise she cannot really recall what behaviors he exhibits when having swallowing I issues . ? ?Studies This Admission  ? ?Labs:  ?Recent Labs  ?  03/27/22 ?3220 03/28/22 ?0422  ?WBC 13.6* 9.6  ?HGB 12.6* 11.8*  ?HCT 37.1* 36.3*  ?PLT 151 129*  ? ?Recent Labs  ?  03/27/22 ?2542 03/28/22 ?0422  ?NA 137 137  ?K 3.9 4.3  ?CL 102 103  ?CO2 25 26  ?GLUCOSE 156* 104*  ?BUN 30* 33*  ?CREATININE 2.11* 1.93*  ?CALCIUM 9.8 8.9  ? ?Recent Labs  ?  03/28/22 ?0422  ?PROT 5.3*  ?ALBUMIN 3.0*  ?AST 68*  ?ALT 32  ?ALKPHOS 42  ?BILITOT 0.7  ? ?No results for input(s): HEPBSAG, HCVAB, Ruth, HEPBIGM in the  last 72 hours. ?Recent Labs  ?  03/27/22 ?1096 03/28/22 ?0422  ?LABPROT 42.9* 31.9*  ?INR 4.6* 3.1*  ? ? ?Previous GI Evaluations  ? ?EGD 2013 for melena ?No abnormalities of esophagus mentioned ? ?Past Medical History:  ?Diagnosis Date  ? Anemia   ? Arthritis   ? Diverticulosis   ? Endocarditis   ? GI bleed   ? Gout   ? Heart murmur   ? Hx of adenomatous colonic polyps   ? Hyperlipemia   ? Hyperparathyroidism ([place])   ? Hypertension   ? Nephrosclerosis   ? ? ?Past Surgical History:  ?Procedure Laterality Date  ? AORTIC AND MITRAL VALVE REPLACEMENT    ? CATARACT EXTRACTION Left    ? with lens implant  ? CORONARY ARTERY BYPASS GRAFT    ? MITRAL VALVE REPLACEMENT    ? ORIF ARM    ? ? ?Family History  ?Problem Relation Age of Onset  ? Hypertension Mother   ? Stroke Mother   ? Hypertension Father   ? Breast cancer Daughter   ? Uterine cancer Daughter   ? Liver cancer Brother 23  ? ? ?Prior to Admission medications   ?Medication Sig Start Date End Date Taking? Authorizing Provider  ?acetaminophen (TYLENOL) 500 MG tablet Take 500 mg by mouth See admin instructions. 2- ?3 times a day   Yes [provider]  ?allopurinol (ZYLOPRIM) 100 MG tablet Take 400 mg by mouth daily.   Yes [provider]  ?amiodarone (PACERONE) 200 MG tablet Take 200 mg by mouth daily.   Yes [provider]  ?bumetanide (BUMEX) 2 MG tablet Take 2 mg by mouth 2 (two) times daily.   Yes [provider]  ?calcitRIOL (ROCALTROL) 0.5 MCG capsule Take 0.5 mcg by mouth every other day. 08/10/12  Yes [provider]  ?calcium carbonate (TUMS - DOSED IN MG ELEMENTAL CALCIUM) 500 MG chewable tablet Chew 1-2 tablets by mouth as needed for indigestion or heartburn.   Yes [provider]  ?cetirizine (ZYRTEC) 10 MG tablet Take 10 mg by mouth at bedtime.   Yes [provider]  ?diclofenac Sodium (VOLTAREN) 1 % GEL Apply 2 g topically 4 (four) times daily as needed (to painful sites).   Yes [provider]  ?diphenhydrAMINE (BENADRYL) 25 MG tablet Take 25 mg by mouth at bedtime as needed (FOR BEE STING-RELATED ALLERGIC REACTIONS).   Yes [provider]  ?glucosamine-chondroitin 500-400 MG tablet Take 1 tablet by mouth daily with lunch.   Yes [provider]  ?mexiletine (MEXITIL) 200 MG capsule Take 200 mg by mouth in the morning, at noon, and at bedtime.   Yes [provider]  ?Multiple Vitamins-Minerals (CENTRUM SILVER 50+MEN) TABS Take 1 tablet by mouth daily with breakfast.   Yes [provider]  ?potassium chloride SA  (K-DUR,KLOR-CON) 20 MEQ tablet Take 20 mEq by mouth 2 (two) times daily with a meal.   Yes [provider]  ?ROLAIDS 550-110 MG CHEW Chew 1-2 tablets by mouth as needed (for heartburn).   Yes [provider]  ?simethicone (MYLICON) 045 MG chewable tablet Chew 125 mg by mouth every 6 (six) hours as needed for flatulence.   Yes [provider]  ?tamsulosin (FLOMAX) 0.4 MG CAPS capsule Take 0.8 mg by mouth at bedtime.   Yes [provider]  ?warfarin (COUMADIN) 3 MG tablet Take 3-6 mg by mouth See admin instructions. Take '3mg'$  daily except '6mg'$  on Fridays   Yes [provider]  ?omeprazole (PRILOSEC) 20 MG capsule Take 1 capsule (20 mg total) by mouth daily. ?Patient not taking: Reported on 03/27/2022 06/20/15   Lafayette Dragon, MD  ?pravastatin (PRAVACHOL) 40 MG tablet Take 80 mg by mouth daily.  ?Patient not taking: Reported on 03/27/2022    [provider]  ? ? ?Current Facility-Administered Medications  ?Medication Dose Route Frequency Provider Last Rate Last Admin  ? acetaminophen (TYLENOL) tablet 650 mg  650 mg Oral Q6H PRN Reubin Milan, MD   650 mg at 03/27/22 1712  ? Or  ? acetaminophen (TYLENOL) suppository 650 mg  650 mg Rectal Q6H PRN Reubin Milan, MD      ? allopurinol (ZYLOPRIM) tablet 400 mg  400 mg Oral Daily Reubin Milan, MD   400 mg at 03/28/22 7124  ? amiodarone (PACERONE) tablet 200 mg  200 mg Oral Daily Reubin Milan, MD   200 mg at 03/28/22 5809  ? bumetanide (BUMEX) tablet 2 mg  2 mg Oral q1800 Reubin Milan, MD   2 mg at 03/27/22 1711  ? bumetanide (BUMEX) tablet 4 mg  4 mg Oral Q breakfast Reubin Milan, MD   4 mg at 03/28/22 0830  ? calcitRIOL (ROCALTROL) capsule 0.5 mcg  0.5 mcg Oral Daily Reubin Milan, MD   0.5 mcg at 03/28/22 9833  ? cefTRIAXone (ROCEPHIN) 2 g in sodium chloride 0.9 % 100 mL IVPB  2 g Intravenous Q24H Rosiland Oz, MD 200 mL/hr at 03/28/22 1213 2 g at 03/28/22 1213  ?  levalbuterol (XOPENEX) nebulizer solution 1.25 mg  1.25 mg Nebulization Q6H PRN Reubin Milan, MD   1.25 mg at 03/28/22 1235  ? loratadine (CLARITIN) tablet 10 mg  10 mg Oral Daily Reubin Milan, MD   10 mg at 04/06/

## 2022-03-28 NOTE — Progress Notes (Signed)
Pt got choked with lunch on both pot roast and on coffee. Wife witness episode and said that this is nothing new for patient. He has done this for at least 10 years at home. He did complain of SOB afterwards. He is on 2L [place], saturations at 95%. Does not appear to be in any distress. Gave PRN Neb treatment. Reports relief. Will continue to monitor. ?

## 2022-03-28 NOTE — Consult Note (Addendum)
? ? ?[facility]  ?                                                                                     ? ?Patient Identification: ?Patient Name: [facility]. MRN: [id number] Admit Date: 03/27/2022  9:19 AM ?Today's Date: 03/28/2022 ?Reason for consult: Bacteremia  ?Requesting provider: Antonieta Pert ? ?Principal Problem: ?  Streptococcal sepsis, unspecified ([place]) ?Active Problems: ?  Hyperlipidemia ?  Hypertension ?  Class 1 obesity ?  Atrial fibrillation ([place]) ?  Chronic gouty arthritis ?  Chronic heart failure with preserved ejection fraction ([place]) ?  Coronary artery disease ?  Paroxysmal ventricular tachycardia ([place]) ?  S/P MVR (mitral valve replacement) ?  S/P AVR ?  Secondary hyperparathyroidism ([place]) ?  Supratherapeutic INR ?  Elevated troponin ?  Acute respiratory failure with hypoxia ([place]) ?  Thrombocytopenia ([place]) ?  CKD (chronic kidney disease) stage 4, GFR 15-29 ml/min ([facility]) ? ? ?Antibiotics:  ?Vancomycin 4/5-c ?Ceftriaxone 4/5-c ?Azithromycin4/5-c ?Metronidazole 4/5-c ? ?Lines/Hardware: mechanical MV and AV, ORIF ARM  ? 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 ?# High grade strep gallolyticus bacteremia r/o PV endocarditis  ?# Acute Hypoxic Respiratory Failure with probable PNA - on 2L [place] ?# Thrombocytopenia ?# AKI  ? ?Recommendations  ?-Will switch abtx to ceftriaxone 2 g iv daily while pending echocardiogram and plan for abtx  accordingly  ?-2 sets of blood cultures ordered for 4/17 a.m. ?-TEE planned for 4/10, Cards notified. GI has been consulted for TEE candidacy due to dysphagia ?-Fu TTE ?-Follow-up sensi of strep gallolyticus ?-Monitor CBC CMP ?-Patient will also need colonoscopy for colonic neoplasm screening given strep gallolyticus bacteremia if not recently done ? ?Dr Linus Salmons will follow from tomorrow ? ?Rest of the management as per the primary team. Please call with questions or concerns.  ?Thank you for the consult ? ?Rosiland Oz, MD ?Infectious Disease Physician ?[facility] ?[address] ?[place],  [address] ?Phone: [phone]  Fax: [phone] ? ?__________________________________________________________________________________________________________ ?HPI and Hospital Course: ?88 Y O male with PMH of Obesity, Gout, secondary hyperparathyroidism,  diverticulosis, paroxysmal VT on amiodarone and mexilitine, A flutter s/p CT I ablation April 2022, CHF, HLD, HTN, h/o COVID myocarditi,s CAD s/p CABG, Abiotrophia spp endocarditiss/p mechanical AVR and MVR in June 2005, h/o E faecalis bacteremia  in June 2021 ( empirically treated for PV endocarditis with ampicillin and ceftriaxone for 6 weeks, EOT 07/08/2020, TTE and TEE did not show any obvious vegetations or aortic root abscess) who presented to the ED with progressive generalized weakness for couple of days PTA. Started having N/V after eating grits for dinner on  4/4 followed by fevers and more N/V the following day. Also has brief period of confusion per son which resolved later  ? ?At ED febrile, [place] 2L oxygen  ?Labs remarkable for CR 2.11, BNP 843, lactic acid 2.0, WBC 13.6, hemoglobin 12.6, INR 4.6 ?4/5 blood cultures both sets Streptococcus species  ? ?Imagings as below ? ?ROS: all systems reviewed with pertinent positives and negatives as listed above  ? ?Past Medical History:  ?Diagnosis Date  ? Anemia    ? Arthritis   ? Diverticulosis   ? Endocarditis   ? GI bleed   ? Gout   ? Heart murmur   ? Hx of adenomatous colonic polyps   ? Hyperlipemia   ? Hyperparathyroidism ([place])   ? Hypertension   ? Nephrosclerosis   ? ?Past Surgical History:  ?Procedure Laterality Date  ? AORTIC AND MITRAL VALVE REPLACEMENT    ? CATARACT EXTRACTION Left   ? with lens implant  ? CORONARY ARTERY BYPASS GRAFT    ? MITRAL VALVE REPLACEMENT    ? ORIF ARM    ? ? ?Scheduled Meds: ? allopurinol  400 mg Oral Daily  ? amiodarone  200 mg Oral Daily  ? bumetanide  2 mg Oral q1800  ? bumetanide  4 mg Oral Q breakfast  ? calcitRIOL  0.5 mcg Oral Daily  ? loratadine  10 mg Oral Daily  ? mouth rinse  15 mL Mouth Rinse BID  ? mexiletine  200 mg Oral TID  ? pantoprazole  40 mg Oral Daily  ? potassium chloride SA  20 mEq Oral BID  ? tamsulosin  0.4 mg Oral Daily  ? ?Continuous Infusions: ? cefTRIAXone (ROCEPHIN)  IV    ? lactated ringers 88 mL/hr at 03/27/22 2202  ? metronidazole Stopped (03/28/22 0100)  ? [START ON 03/29/2022] vancomycin    ? ?PRN Meds:.acetaminophen **OR** acetaminophen, levalbuterol, ondansetron **OR** ondansetron (ZOFRAN) IV, simethicone ? ?Allergies  ?Allergen Reactions  ? Bee Venom Anaphylaxis  ? Ace Inhibitors Cough  ? Atorvastatin Other (See Comments)  ?  Muscle Pain  ? Pravastatin Other (See Comments)  ?  Muscle Pain  ? Ranexa [Ranolazine] Other (See Comments)  ?  Tachycardia  ? ?Social History  ? ?Socioeconomic History  ? Marital status: Married  ?  Spouse name: Not on file  ? Number of children: Not on file  ? Years of education: Not on file  ? Highest education level: Not on file  ?Occupational History  ? Not on file  ?Tobacco Use  ? Smoking status: Former  ? Smokeless tobacco: Never  ?Substance and Sexual Activity  ? Alcohol use: No  ? Drug use: No  ? Sexual activity: Not on file  ?Other Topics Concern  ? Not on file  ?Social History Narrative  ? Not on file  ? ?Social Determinants of Health  ? ?Financial Resource Strain: Not on  file  ?Food Insecurity: Not on file  ?Transportation Needs: Not on file  ?Physical Activity: Not on file  ?Stress: Not on file  ?Social Connections: Not on file  ?Intimate Partner Violence: Not on file  ? ? ?Breast Cancer-relatedfamily history includes Breast cancer in his daughter. ? ?Vitals ?BP 98/71 (BP Location: Left Arm)   Pulse 89   Temp 97.8 ?F (36.6 ?C) (Oral)   Resp 20   Ht '5\' 4"'$  (1.626 m)   Wt 86.2 kg   SpO2 94%   BMI 32.61 kg/m?  ? ? ?Physical Exam ?Constitutional: Sitting up in  the bed, hard of hearing ?   Comments:  ? ?Cardiovascular:  ?   Rate and Rhythm: Normal rate and regular rhythm.  ?   Heart sounds: Mechanical heart sounds ? ?Pulmonary:  ?   Effort: Pulmonary effort is normal nasal cannula ?   Comments: Bilateral equal air entry, basal [place] ? ?Abdominal:  ?   Palpations: Abdomen is soft.  ?   Tenderness: Nontender and nondistended ? ?Musculoskeletal:     ?   General: No swelling or tenderness.  ? ?Skin: ?   Comments: No obvious lesions or rashes ? ?Neurological:  ?   General: Grossly nonfocal, awake alert and oriented ? ?Psychiatric:     ?   Mood and Affect: Mood normal.  ? ? ?Pertinent Microbiology ?Results for orders placed or performed during the hospital encounter of 03/27/22  ?Resp Panel by RT-PCR (Flu A&B, Covid) Nasopharyngeal Swab     Status: None  ? Collection Time: 03/27/22  9:34 AM  ? Specimen: Nasopharyngeal Swab; Nasopharyngeal(NP) swabs in vial transport medium  ?Result Value Ref Range Status  ? SARS Coronavirus 2 by RT PCR NEGATIVE NEGATIVE Final  ?  Comment: (NOTE) ?SARS-CoV-2 target nucleic acids are NOT DETECTED. ? ?The SARS-CoV-2 RNA is generally detectable in upper respiratory ?specimens during the acute phase of infection. The lowest ?concentration of SARS-CoV-2 viral copies this assay can detect is ?138 copies/mL. A negative result does not preclude SARS-Cov-2 ?infection and should not be used as the sole basis for treatment or ?other patient management decisions. A  negative result may occur with  ?improper specimen collection/handling, submission of specimen other ?than nasopharyngeal swab, presence of viral mutation(s) within the ?areas targeted by this assay, and inadequat

## 2022-03-28 NOTE — Evaluation (Signed)
Clinical/Bedside Swallow Evaluation ?Patient Details  ?Name: Steve Riddle. ?MRN: [id number] ?Date of Birth: Apr 02, 1929 ? ?Today's Date: 03/28/2022 ?Time: SLP Start Time (ACUTE ONLY): 0093 SLP Stop Time (ACUTE ONLY): 8182 ?SLP Time Calculation (min) (ACUTE ONLY): 20 min ? ?Past Medical History:  ?Past Medical History:  ?Diagnosis Date  ? Anemia   ? Arthritis   ? Diverticulosis   ? Endocarditis   ? GI bleed   ? Gout   ? Heart murmur   ? Hx of adenomatous colonic polyps   ? Hyperlipemia   ? Hyperparathyroidism ([place])   ? Hypertension   ? Nephrosclerosis   ? ?Past Surgical History:  ?Past Surgical History:  ?Procedure Laterality Date  ? AORTIC AND MITRAL VALVE REPLACEMENT    ? CATARACT EXTRACTION Left   ? with lens implant  ? CORONARY ARTERY BYPASS GRAFT    ? MITRAL VALVE REPLACEMENT    ? ORIF ARM    ? ?HPI:  ?Pt is a 88 yo male with H/O COVID, gout, secondary hyperparathyroidism,paroxysmal ventricular tachycardia on amiodarone and mexiletine, history of atrial flutter, history of CTI ablation April 2022 at [facility], history of systolic heart failure with an EF of 45%, most recently diagnosed with chronic heart failure with preserved EF, hyperlipidemia, hypertension,CAD, history of COVID myocarditis, history of mechanical heart valves, history of Enterococcus bacteremia treated empirically with vancomycin presented with generalized weakness progressively worse for couple of weeks, developed nausea and vomiting after eating grits for dinner followed by fever and more episodes of nausea vomiting, so came to the ED for evaluation where he was also short of breath placed on 2 L nasal cannula. PT was referred to GI due to concerns for possible stricture that may make TEE dangerous - Per GI MD, "not clear dysphagia symptoms are esophageal , transfer dysphagia or combination of both. History from patient is vague but describes regurgitation of food sometimes. Today he "choked" on stew beef. Spoke with wife who gives a 10 year history  of ''strangling", more commonly with liquids which sounds more like transfer dysphagia."  SLP swallow evaluation also ordered.   Pt endorses to SLP coughing with liquids more than foods for "years".  He denies this occurs with every meal- occuring approx once a week, states it occurs more in the afternoon.  Denies having pneumonias until last - pna at age 89 reported.  Pt brain imagind showed "Redemonstrated small chronic cortical/subcortical infarct within the  right parietal lobe.     Mild chronic small ischemic changes within the cerebral white  matter, progressed from the prior head CT of 01/02/2013.     Redemonstrated small chronic infarct within the left cerebellar  hemisphere.     Mild-to-moderate generalized cerebral atrophy.     Comparatively mild cerebellar atrophy"  Pt and his daughter deny pt having any neuro hx.  He does endores GERD and confirms that liquids will "come back up" after swallowing causing him to feel as is he will "choke".  ?  ?Assessment / Plan / Recommendation  ?Clinical Impression ? Patient noted to have mild left facial asymmetry - which daughter states is baseline for pt- otherwise no focal CN deficits present.  Pt confirms h/o choking on liquids more than foods approximately once a week and worse in the afternoon.  He endorses refluxing and coughing - causing SLP to suspect his dysphagia is due to esophageal deficits. Can not rule out pharyngeal -cervical esophageal component of dysphagia given facial nerve deficit and findings on CT imaging.  Note plan is for pt to have a barum swallow with tablet on 03/29/2022.  Reached out to xray staff re: potential scheduling - as of now potential is for noon or 1400 but nursing will need to call xray in am for definitive plan. Advised pt and his daughter to need to be npo for 3 hours before esophagram.  Pt observed consuming water, jello and cracker bolus - only minimal amount as he said he did not want to eat.  Pt advised if he does not  masticate well, he will get choked - thus agree with dys3/thin diet at this time.  Subtle eructation noted after water swallow and delayed cough after jello consumption. Pt isolates symptoms of food lodging to lower pharynx - concerning for potential referrant sensation from esophageal deficits.  Pt did clinically tolerate one consistency over another - thus advised he drink water and SLP will follow up next date after esophagram.  Educated pt and his daughter to recommendations for general aspiration precautions using teach back. ?SLP Visit Diagnosis: Dysphagia, unspecified (R13.10) ?   ?Aspiration Risk ? Mild aspiration risk  ?  ?Diet Recommendation Dysphagia 3 (Mech soft);Thin liquid  ? ?Liquid Administration via: Cup;Straw ?Medication Administration: Whole meds with liquid ?Supervision: Patient able to self feed ?Compensations: Slow rate;Small sips/bites ?Postural Changes: Seated upright at 90 degrees;Remain upright for at least 30 minutes after po intake  ?  ?Other  Recommendations Oral Care Recommendations: Oral care BID   ? ?Recommendations for follow up therapy are one component of a multi-disciplinary discharge planning process, led by the attending physician.  Recommendations may be updated based on patient status, additional functional criteria and insurance authorization. ? ?Follow up Recommendations    ? ? ?  ?Assistance Recommended at Discharge Other (comment) (TBD)  ?Functional Status Assessment Patient has not had a recent decline in their functional status  ?Frequency and Duration min 1 x/week  ?2 weeks;1 week ?  ?   ? ?Prognosis Prognosis for Safe Diet Advancement: Fair ?Barriers to Reach Goals: Time post onset  ? ?  ? ?Swallow Study   ?General Date of Onset: 03/28/22 ?HPI: Pt is a 88 yo male with H/O COVID, gout, secondary hyperparathyroidism,paroxysmal ventricular tachycardia on amiodarone and mexiletine, history of atrial flutter, history of CTI ablation April [facility] at [facility], history of systolic  heart failure with an EF of 45%, most recently diagnosed with chronic heart failure with preserved EF, hyperlipidemia, hypertension,CAD, history of COVID myocarditis, history of mechanical heart valves, history of Enterococcus bacteremia treated empirically with vancomycin presented with generalized weakness progressively worse for couple of weeks, developed nausea and vomiting after eating grits for dinner followed by fever and more episodes of nausea vomiting, so came to the ED for evaluation where he was also short of breath placed on 2 L nasal cannula. PT was referred to GI due to concerns for possible stricture that may make TEE dangerous - Per GI MD, "not clear dysphagia symptoms are esophageal , transfer dysphagia or combination of both. History from patient is vague but describes regurgitation of food sometimes. Today he "choked" on stew beef. Spoke with wife who gives a 10 year history of ''strangling", more commonly with liquids which sounds more like transfer dysphagia."  SLP swallow evaluation also ordered.   Pt endorses to SLP coughing with liquids more than foods for "years".  He denies this occurs with every meal- occuring approx once a week, states it occurs more in the afternoon.  Denies having pneumonias until  last - pna at age 89 reported.  Pt brain imagind showed "Redemonstrated small chronic cortical/subcortical infarct within the  right parietal lobe.     Mild chronic small ischemic changes within the cerebral white  matter, progressed from the prior head CT of 01/02/2013.     Redemonstrated small chronic infarct within the left cerebellar  hemisphere.     Mild-to-moderate generalized cerebral atrophy.     Comparatively mild cerebellar atrophy"  Pt and his daughter deny pt having any neuro hx.  He does endores GERD and confirms that liquids will "come back up" after swallowing causing him to feel as is he will "choke". ?Type of Study: MBS-Modified Barium Swallow Study ?Diet Prior to this Study:  Dysphagia 3 (soft);Thin liquids ?Temperature Spikes Noted: No ?Respiratory Status: Room air ?History of Recent Intubation: No ?Behavior/Cognition: Alert;Cooperative;Pleasant mood ?Oral Cavity Assessment: Dr

## 2022-03-28 NOTE — Progress Notes (Signed)
Pt c/o burning with urination, feeling as if he is unable to fully empty bladder. Noted to have frequent urination, each time around 50 cc's. Bladder scan completed, showing approximately 400 cc's. In and out cath completed, removing 750 cc's of urine. Pt reports relief. ?

## 2022-03-28 NOTE — TOC Initial Note (Signed)
Transition of Care (TOC) - Initial/Assessment Note  ? ? ?Patient Details  ?Name: Steve Riddle. ?MRN: [id number] ?Date of Birth: 06-21-1929 ? ?Transition of Care [facility]) CM/SW Contact:    ?Dessa Phi, RN ?Phone Number: ?03/28/2022, 11:54 AM ? ?Clinical Narrative:  Spoke to spouse about d/c plans-Home. May need HHC-no preference-[facility] following rep Kenzie aware;may need iv abx-Pam [facility] iv infusion w/[facility]  following.Has own transport.             ? ? ?Expected Discharge Plan: [place] ?Barriers to Discharge: Continued Medical Work up ? ? ?Patient Goals and CMS Choice ?Patient states their goals for this hospitalization and ongoing recovery are:: Home ?CMS Medicare.gov Compare Post Acute Care list provided to:: Patient Represenative (must comment) (spouse [name] [phone]) ?Choice offered to / list presented to : Spouse ? ?Expected Discharge Plan and Services ?Expected Discharge Plan: [place] ?  ?Discharge Planning Services: CM Consult ?Post Acute Care Choice: Home Health ?Living arrangements for the past 2 months: [place] ?                ?  ?  ?  ?  ?  ?  ?  ?  ?  ?  ? ?Prior Living Arrangements/Services ?Living arrangements for the past 2 months: [place] ?Lives with:: Spouse ?Patient language and need for interpreter reviewed:: Yes ?Do you feel safe going back to the place where you live?: Yes      ?Need for Family Participation in Patient Care: Yes (Comment) ?Care giver support system in place?: Yes (comment) ?Current home services: DME (cane, rollator) ?Criminal Activity/Legal Involvement Pertinent to Current Situation/Hospitalization: No - Comment as needed ? ?Activities of Daily Living ?Home Assistive Devices/Equipment: Eyeglasses, Kasandra Knudsen (specify quad or straight) ?ADL Screening (condition at time of admission) ?Patient's cognitive ability adequate to safely complete daily activities?: Yes ?Is the patient deaf or have difficulty hearing?:  No ?Does the patient have difficulty seeing, even when wearing glasses/contacts?: Yes ?Does the patient have difficulty concentrating, remembering, or making decisions?: Yes ?Patient able to express need for assistance with ADLs?: Yes ?Does the patient have difficulty dressing or bathing?: Yes ?Independently performs ADLs?: No ?Communication: Independent ?Dressing (OT): Needs assistance ?Is this a change from baseline?: Pre-admission baseline ?Grooming: Needs assistance ?Feeding: Independent, Needs assistance ?Bathing: Needs assistance ?Toileting: Needs assistance ?In/Out Bed: Needs assistance ?Walks in Home: Independent with device (comment) ?Does the patient have difficulty walking or climbing stairs?: Yes ?Weakness of Legs: Both ?Weakness of Arms/Hands: Both ? ?Permission Sought/Granted ?Permission sought to share information with : Case Manager ?Permission granted to share information with : Yes, Verbal Permission Granted ? Share Information with NAME: Case Manager ?   ?   ?   ? ?Emotional Assessment ?Appearance:: Appears stated age ?Attitude/Demeanor/Rapport: Gracious ?Affect (typically observed): Accepting ?Orientation: : Oriented to Self, Oriented to Place, Oriented to  Time, Oriented to Situation ?Alcohol / Substance Use: Not Applicable ?Psych Involvement: No (comment) ? ?Admission diagnosis:  Sepsis due to undetermined organism [facility]) [A41.9] ?Patient Active Problem List  ? Diagnosis Date Noted  ? Thrombocytopenia ([place]) 03/28/2022  ? CKD (chronic kidney disease) stage 4, GFR 15-29 ml/min (HCC) 03/28/2022  ? Streptococcal sepsis, unspecified ([place]) 03/27/2022  ? Hyperlipidemia 03/27/2022  ? Hypertension 03/27/2022  ? Class 1 obesity 03/27/2022  ? Gout 03/27/2022  ? Benign prostatic hyperplasia with lower urinary tract symptoms 03/27/2022  ? COVID-19 03/27/2022  ? History of gastrointestinal disease 03/27/2022  ?  Supratherapeutic INR 03/27/2022  ? Elevated troponin 03/27/2022  ? Acute respiratory failure with  hypoxia ([place]) 03/27/2022  ? Paroxysmal ventricular tachycardia ([place]) 08/23/2020  ? Chronic kidney disease due to hypertension 03/21/2020  ? Chronic heart failure with preserved ejection fraction ([place]) 01/19/2020  ? S/P MVR (mitral valve replacement) 01/19/2020  ? S/P AVR 01/19/2020  ? Actinic keratosis 05/12/2015  ? Black stools 04/01/2014  ? GI bleed 04/01/2014  ? Occult blood in stools 08/11/2012  ? Chronic anticoagulation 08/11/2012  ? Atrial fibrillation ([place]) 10/03/2011  ? Chronic gouty arthritis 10/03/2011  ? Coronary artery disease 10/03/2011  ? Herpes simplex viral infection 05/24/2010  ? Mitral valve disorder 01/18/2010  ? Acute and subacute bacterial endocarditis 11/07/2009  ? Secondary hyperparathyroidism ([place]) 11/07/2009  ? ?PCP:  [name], MD ?Pharmacy:   ?[facility] #[id number] - [place], [place] [address] ?[address] ?[address] [place] [address] ?Phone: [phone] Fax: [phone] ? ? ? ? ?Social Determinants of Health (SDOH) Interventions ?  ? ?Readmission Risk Interventions ?   ? View : No data to display.  ?  ?  ?  ? ? ? ?

## 2022-03-29 ENCOUNTER — Inpatient Hospital Stay (HOSPITAL_COMMUNITY): Payer: Medicare Other

## 2022-03-29 DIAGNOSIS — A409 Streptococcal sepsis, unspecified: Secondary | ICD-10-CM | POA: Diagnosis not present

## 2022-03-29 DIAGNOSIS — J9601 Acute respiratory failure with hypoxia: Secondary | ICD-10-CM | POA: Diagnosis not present

## 2022-03-29 DIAGNOSIS — R6520 Severe sepsis without septic shock: Secondary | ICD-10-CM | POA: Diagnosis not present

## 2022-03-29 LAB — CBC WITH DIFFERENTIAL/PLATELET
Abs Immature Granulocytes: 0.02 10*3/uL (ref 0.00–0.07)
Basophils Absolute: 0 10*3/uL (ref 0.0–0.1)
Basophils Relative: 0 %
Eosinophils Absolute: 0.1 10*3/uL (ref 0.0–0.5)
Eosinophils Relative: 1 %
HCT: 35.6 % — ABNORMAL LOW (ref 39.0–52.0)
Hemoglobin: 12.1 g/dL — ABNORMAL LOW (ref 13.0–17.0)
Immature Granulocytes: 0 %
Lymphocytes Relative: 5 %
Lymphs Abs: 0.5 10*3/uL — ABNORMAL LOW (ref 0.7–4.0)
MCH: 34.2 pg — ABNORMAL HIGH (ref 26.0–34.0)
MCHC: 34 g/dL (ref 30.0–36.0)
MCV: 100.6 fL — ABNORMAL HIGH (ref 80.0–100.0)
Monocytes Absolute: 0.7 10*3/uL (ref 0.1–1.0)
Monocytes Relative: 8 %
Neutro Abs: 7.5 10*3/uL (ref 1.7–7.7)
Neutrophils Relative %: 86 %
Platelets: 139 10*3/uL — ABNORMAL LOW (ref 150–400)
RBC: 3.54 MIL/uL — ABNORMAL LOW (ref 4.22–5.81)
RDW: 14.6 % (ref 11.5–15.5)
WBC: 8.7 10*3/uL (ref 4.0–10.5)
nRBC: 0 % (ref 0.0–0.2)

## 2022-03-29 LAB — CULTURE, BLOOD (ROUTINE X 2)
Special Requests: ADEQUATE
Special Requests: ADEQUATE

## 2022-03-29 LAB — URINE CULTURE: Culture: <10000 — AB

## 2022-03-29 LAB — BASIC METABOLIC PANEL
Anion gap: 8 (ref 5–15)
BUN: 36 mg/dL — ABNORMAL HIGH (ref 8–23)
CO2: 26 mmol/L (ref 22–32)
Calcium: 8.9 mg/dL (ref 8.9–10.3)
Chloride: 101 mmol/L (ref 98–111)
Creatinine, Ser: 1.93 mg/dL — ABNORMAL HIGH (ref 0.61–1.24)
GFR, Estimated: 32 mL/min — ABNORMAL LOW (ref >60)
Glucose, Bld: 106 mg/dL — ABNORMAL HIGH (ref 70–99)
Potassium: 3.7 mmol/L (ref 3.5–5.1)
Sodium: 135 mmol/L (ref 135–145)

## 2022-03-29 LAB — PROTIME-INR
INR: 2.2 — ABNORMAL HIGH (ref 0.8–1.2)
Prothrombin Time: 24.4 seconds — ABNORMAL HIGH (ref 11.4–15.2)

## 2022-03-29 LAB — MAGNESIUM: Magnesium: 2.2 mg/dL (ref 1.7–2.4)

## 2022-03-29 MED ORDER — HEPARIN BOLUS VIA INFUSION
2000.0000 [IU] | Freq: Once | INTRAVENOUS | Status: DC
  Filled 2022-03-29: qty 2000

## 2022-03-29 MED ORDER — WARFARIN SODIUM 6 MG PO TABS
6.0000 mg | ORAL_TABLET | Freq: Once | ORAL | Status: AC
  Administered 2022-03-29: 6 mg via ORAL
  Filled 2022-03-29: qty 1

## 2022-03-29 MED ORDER — ENOXAPARIN SODIUM 100 MG/ML IJ SOSY
1.0000 mg/kg | PREFILLED_SYRINGE | Freq: Every day | INTRAMUSCULAR | Status: DC
  Administered 2022-03-29: 85 mg via SUBCUTANEOUS
  Filled 2022-03-29: qty 1

## 2022-03-29 MED ORDER — ENOXAPARIN SODIUM 100 MG/ML IJ SOSY: 1.0000 mg/kg | PREFILLED_SYRINGE | INTRAMUSCULAR | Status: DC

## 2022-03-29 MED ORDER — HEPARIN (PORCINE) 25000 UT/250ML-% IV SOLN
1200.0000 [IU]/h | INTRAVENOUS | Status: DC
Start: 2022-03-29 — End: 2022-03-29
  Filled 2022-03-29: qty 250

## 2022-03-29 MED ORDER — PENICILLIN G POTASSIUM 20000000 UNITS IJ SOLR
6.0000 10*6.[IU] | Freq: Two times a day (BID) | INTRAVENOUS | Status: DC
  Administered 2022-03-29 – 2022-04-04 (×14): 6 10*6.[IU] via INTRAVENOUS
  Filled 2022-03-29 (×7): qty 6
  Filled 2022-03-29: qty 3.5
  Filled 2022-03-29 (×8): qty 6

## 2022-03-29 NOTE — Progress Notes (Signed)
SATURATION QUALIFICATIONS: (This note is used to comply with regulatory documentation for home oxygen) ? ?Patient Saturations on Room Air at Rest = 98% ? ?Patient Saturations on Room Air while Ambulating = 86% ? ?Will need to monitor ambulation saturations and titrate O2 if needed.  ? ?Blondell Reveal Kistler PT 03/29/2022  ?Acute Rehabilitation Services ?Pager [phone] ?Office [phone] ? ?

## 2022-03-29 NOTE — Progress Notes (Signed)
ANTICOAGULATION CONSULT NOTE ? ?Pharmacy Consult for Warfarin ?Indication: mechanical MVR/AVR, atrial flutter  ? ?Allergies  ?Allergen Reactions  ? Bee Venom Anaphylaxis  ? Ace Inhibitors Cough  ? Atorvastatin Other (See Comments)  ?  Muscle Pain  ? Pravastatin Other (See Comments)  ?  Muscle Pain  ? Ranexa [Ranolazine] Other (See Comments)  ?  Tachycardia  ? ? ?Patient Measurements: ?Height: '5\' 4"'$  (162.6 cm) ?Weight: 86.2 kg (190 lb) ?IBW/kg (Calculated) : 59.2 ? ? ?Vital Signs: ?Temp: 98.5 ?F (36.9 ?C) (04/07 0447) ?Temp Source: Oral (04/07 0447) ?BP: 108/73 (04/07 0447) ?Pulse Rate: 94 (04/07 0447) ? ?Labs: ?Recent Labs  ?  03/27/22 ?9211 03/27/22 ?1415 03/28/22 ?0422 03/29/22 ?9417  ?HGB 12.6*  --  11.8* 12.1*  ?HCT 37.1*  --  36.3* 35.6*  ?PLT 151  --  129* 139*  ?APTT 67*  --   --   --   ?LABPROT 42.9*  --  31.9* 24.4*  ?INR 4.6*  --  3.1* 2.2*  ?CREATININE 2.11*  --  1.93* 1.93*  ?TROPONINIHS 44* 67*  --   --   ? ? ? ?Estimated Creatinine Clearance: 24.7 mL/min (A) (by C-G formula based on SCr of 1.93 mg/dL (H)). ? ? ?Medical History: ?Past Medical History:  ?Diagnosis Date  ? Anemia   ? Arthritis   ? Diverticulosis   ? Endocarditis   ? GI bleed   ? Gout   ? Heart murmur   ? Hx of adenomatous colonic polyps   ? Hyperlipemia   ? Hyperparathyroidism ([place])   ? Hypertension   ? Nephrosclerosis   ? ? ?Assessment: ?76 y/oM with PMH of vtach, aflutter, MVR/AVR, gout, HTN, HLD, anemia, HF with EF 45%, CAD,  history of Enterococcus bacteremia who presented to [facility] ED with n/v, weakness, fever. INR on admission 4.6. Vitamin K 2.'5mg'$  PO was given per EDP on 4/5 at 1112. Pharmacy consulted for warfarin dosing this admission. ? ?PTA warfarin regimen '3mg'$  daily except '6mg'$  on Fridays with goal INR 2.5-3.5 (regimen and goal range confirmed with Dr. Buel Ream office on 4/6) ? ?Today, 03/29/22:  ?INR 2.2, now subtherapeutic (likely result of Vitamin K given in ED on 4/5) ?CBC: Hgb low/stable at 12.1. Pltc low, but increased to  139K ?No bleeding issues noted ?Drug interactions: Penicillin G can increase INR ?Per discussion with Dr. Lupita Leash and Dr. Benson Norway, start heparin drip and hold warfarin for now while awaiting results of barium swallow. Per GI, ok to use heparin boluses.  ? ?Goal of Therapy:  ?Heparin level 0.3-0.7 units/mL ?Monitor platelets by anticoagulation protocol: Yes ?  ?Plan:  ?Heparin 2000 units IV bolus x 1, then start heparin infusion at 1200 units/hr ?Heparin level 8 hours after initiation  ?Daily CBC, heparin level ?Monitor closely for s/sx of bleeding ?F/u for ability to resume warfarin  ? ? ?Lindell Spar, PharmD, BCPS ?Clinical Pharmacist ?03/29/2022,9:00 AM ? ? ?Addendum: ?Barium swallow negative for stricture. Per discussion with Dr. Lupita Leash and Benson Norway, stop heparin drip (not yet started per RN) and use Enoxaparin for bridging while INR subtherapeutic. Can also resume warfarin per MDs. ? ?Plan:  ?Warfarin 6 PO x 1 today ?Enoxaparin '1mg'$ /kg SQ q24h while INR subtherapeutic (CrCl < 30 ml/min) ?Daily PT/INR ?CBC at least q72h. Check in AM. ?Monitor closely for s/sx of bleeding. ? ? ?Lindell Spar, PharmD, BCPS ?Clinical Pharmacist  ?03/29/2022 10:03 AM ? ?

## 2022-03-29 NOTE — Progress Notes (Signed)
Speech Language Pathology Treatment: Dysphagia  ?Patient Details ?Name: Steve Riddle. ?MRN: [id number] ?DOB: 03/31/1929 ?Today's Date: 03/29/2022 ?Time: 1660-6301 ?SLP Time Calculation (min) (ACUTE ONLY): 12 min ? ?Assessment / Plan / Recommendation ?Clinical Impression ? Pt and family believe that his swallowing is improved - he has had a good appetite today and there has been no overt coughing or feeling of getting choked during PO intake. Daughter thinks he has been doing better eating in his chair today, as opposed to trying to eat in bed, but overall they all comment on his improvement. He had a delayed cough today while consuming soft solids and thin liquids but no other overt signs of dysphagia and he has no subjective complaints. SLP reviewed imaging from esophagram to watch for any overt aspiration with none identified. Pt would like to continue on Dys 3 diet and thin liquids. Aspiration and esophageal precautions were reviewed. Per family request, will f/u as able after TEE at least one more time. ?  ?HPI HPI: Pt is a 88 yo male with H/O COVID, gout, secondary hyperparathyroidism,paroxysmal ventricular tachycardia on amiodarone and mexiletine, history of atrial flutter, history of [facility]I ablation April [facility] at [facility], history of systolic heart failure with an EF of 45%, most recently diagnosed with chronic heart failure with preserved EF, hyperlipidemia, hypertension,CAD, history of COVID myocarditis, history of mechanical heart valves, history of Enterococcus bacteremia treated empirically with vancomycin presented with generalized weakness progressively worse for couple of weeks, developed nausea and vomiting after eating grits for dinner followed by fever and more episodes of nausea vomiting, so came to the ED for evaluation where he was also short of breath placed on 2 L nasal cannula. PT was referred to GI due to concerns for possible stricture that may make TEE dangerous - Per GI MD, "not clear dysphagia  symptoms are esophageal , transfer dysphagia or combination of both. History from patient is vague but describes regurgitation of food sometimes. Today he "choked" on stew beef. Spoke with wife who gives a 10 year history of ''strangling", more commonly with liquids which sounds more like transfer dysphagia."  SLP swallow evaluation also ordered.   Pt endorses to SLP coughing with liquids more than foods for "years".  He denies this occurs with every meal- occuring approx once a week, states it occurs more in the afternoon.  Denies having pneumonias until last - pna at age 26 reported.  Pt brain imagind showed "Redemonstrated small chronic cortical/subcortical infarct within the  right parietal lobe.     Mild chronic small ischemic changes within the cerebral white  matter, progressed from the prior head CT of 01/02/2013.     Redemonstrated small chronic infarct within the left cerebellar  hemisphere.     Mild-to-moderate generalized cerebral atrophy.     Comparatively mild cerebellar atrophy"  Pt and his daughter deny pt having any neuro hx.  He does endores GERD and confirms that liquids will "come back up" after swallowing causing him to feel as is he will "choke". ?  ?   ?SLP Plan ? Continue with current plan of care ? ?  ?  ?Recommendations for follow up therapy are one component of a multi-disciplinary discharge planning process, led by the attending physician.  Recommendations may be updated based on patient status, additional functional criteria and insurance authorization. ?  ? ?Recommendations  ?Diet recommendations: Dysphagia 3 (mechanical soft);Thin liquid ?Liquids provided via: Cup;Straw ?Medication Administration: Whole meds with liquid ?Supervision: Patient able to self feed;Intermittent  supervision to cue for compensatory strategies ?Compensations: Slow rate;Small sips/bites;Follow solids with liquid ?Postural Changes and/or Swallow Maneuvers: Seated upright 90 degrees;Upright 30-60 min after meal  ?    ?    ?   ? ? ? ? Oral Care Recommendations: Oral care BID ?Follow Up Recommendations: No SLP follow up ?Assistance recommended at discharge: PRN ?SLP Visit Diagnosis: Dysphagia, unspecified (R13.10) ?Plan: Continue with current plan of care ? ? ? ? ?  ?  ? ? ?Osie Bond., M.A. CCC-SLP ?Acute Rehabilitation Services ?Office [phone] ? ?Secure chat preferred ? ? ?03/29/2022, 11:35 AM ?

## 2022-03-29 NOTE — Evaluation (Signed)
Physical Therapy Evaluation ?Patient Details ?Name: Steve Riddle. ?MRN: [id number] ?DOB: [birth year] ?Today's Date: 03/29/2022 ? ?History of Present Illness ? 88 y.o. male with medical history significant of class I obesity, gout, secondary hyperparathyroidism, paroxysmal ventricular tachycardia on amiodarone and mexiletine, history of atrial flutter, history of CTI ablation April 2022 at [facility], history of systolic heart failure with an EF of 45%, most recently diagnosed with chronic heart failure with preserved EF, hyperlipidemia, hypertension,CAD, history of COVID myocarditis, history of mechanical heart valves, history of Enterococcus bacteremia treated empirically with vancomycin who presented to the emergency department with complaints of progressively worse generalized weakness for the past couple of weeks. Dx of sepsis due to undetermined organism, respiratory failure with hypoxia.  ?Clinical Impression ? Pt admitted with above diagnosis. Pt ambulated 70' with RW, SaO2 86% on room air walking, 98% room air after 1 minute seated rest with pursed lip breathing. I expect pt can DC home without f/u PT.  Pt currently with functional limitations due to the deficits listed below (see PT Problem List). Pt will benefit from skilled PT to increase their independence and safety with mobility to allow discharge to the venue listed below.   ?   ?   ? ?Recommendations for follow up therapy are one component of a multi-disciplinary discharge planning process, led by the attending physician.  Recommendations may be updated based on patient status, additional functional criteria and insurance authorization. ? ?Follow Up Recommendations No PT follow up ? ?  ?Assistance Recommended at Discharge Set up Supervision/Assistance  ?Patient can return home with the following ? A little help with bathing/dressing/bathroom ? ?  ?Equipment Recommendations None recommended by PT  ?Recommendations for Other Services ?    ?  ?Functional Status  Assessment Patient has had a recent decline in their functional status and demonstrates the ability to make significant improvements in function in a reasonable and predictable amount of time.  ? ?  ?Precautions / Restrictions Precautions ?Precautions: Other (comment) ?Precaution Comments: monitor O2 ?Restrictions ?Weight Bearing Restrictions: No  ? ?  ? ?Mobility ? Bed Mobility ?Overal bed mobility: Modified Independent ?  ?  ?  ?  ?  ?  ?General bed mobility comments: used bedrail, increased time, HOB up 20* ?  ? ?Transfers ?Overall transfer level: Needs assistance ?Equipment used: Rolling walker (2 wheels) ?Transfers: Sit to/from Stand ?Sit to Stand: Min assist ?  ?  ?  ?  ?  ?General transfer comment: VCs hand placement ?  ? ?Ambulation/Gait ?Ambulation/Gait assistance: Min guard ?Gait Distance (Feet): 90 Feet ?Assistive device: Rolling walker (2 wheels) ?Gait Pattern/deviations: Step-through pattern, Decreased stride length ?Gait velocity: WFL ?  ?  ?General Gait Details: steady, no loss of balance, SaO2 86% on room air at lowest while walking, HR 100, pt reports mild dizziness walking, SaO2 98% on room air after 1 minute rest with pursed lip breathing ? ?Stairs ?  ?  ?  ?  ?  ? ?Wheelchair Mobility ?  ? ?Modified Rankin (Stroke Patients Only) ?  ? ?  ? ?Balance Overall balance assessment: Modified Independent ?  ?  ?  ?  ?  ?  ?  ?  ?  ?  ?  ?  ?  ?  ?  ?  ?  ?  ?   ? ? ? ?Pertinent Vitals/Pain Pain Assessment ?Pain Assessment: No/denies pain ?Pain Score: 0-No pain  ? ? ?Home Living Family/patient expects to be discharged to:: Private residence ?Living  Arrangements: Spouse/significant other;Children ?Available Help at Discharge: Family;Available 24 hours/day ?Type of Home: House ?Home Access: Ramped entrance ?  ?  ?  ?Home Layout: One level ?Home Equipment: Rollator (4 wheels);Cane - single point ?   ?  ?Prior Function Prior Level of Function : Independent/Modified Independent;Driving ?  ?  ?  ?  ?  ?   ?Mobility Comments: used cane or rollator at all times; mowed lawn with riding mower day before admission, denies falls in past 6 months ?  ?  ? ? ?Hand Dominance  ?   ? ?  ?Extremity/Trunk Assessment  ? Upper Extremity Assessment ?Upper Extremity Assessment: Overall WFL for tasks assessed ?  ? ?Lower Extremity Assessment ?Lower Extremity Assessment: Overall WFL for tasks assessed ?  ? ?Cervical / Trunk Assessment ?Cervical / Trunk Assessment: Normal  ?Communication  ? Communication: No difficulties  ?Cognition Arousal/Alertness: Awake/alert ?Behavior During Therapy: [facility] for tasks assessed/performed ?Overall Cognitive Status: Within Functional Limits for tasks assessed ?  ?  ?  ?  ?  ?  ?  ?  ?  ?  ?  ?  ?  ?  ?  ?  ?  ?  ?  ? ?  ?General Comments   ? ?  ?Exercises    ? ?Assessment/Plan  ?  ?PT Assessment Patient needs continued PT services  ?PT Problem List Decreased activity tolerance;Cardiopulmonary status limiting activity;Decreased mobility ? ?   ?  ?PT Treatment Interventions Gait training;Therapeutic exercise;Patient/family education;Therapeutic activities;Functional mobility training   ? ?PT Goals (Current goals can be found in the Care Plan section)  ?Acute Rehab PT Goals ?Patient Stated Goal: return to independence ?PT Goal Formulation: With patient/family ?Time For Goal Achievement: 04/12/22 ?Potential to Achieve Goals: Good ? ?  ?Frequency Min 3X/week ?  ? ? ?Co-evaluation   ?  ?  ?  ?  ? ? ?  ?AM-PAC PT "6 Clicks" Mobility  ?Outcome Measure Help needed turning from your back to your side while in a flat bed without using bedrails?: None ?Help needed moving from lying on your back to sitting on the side of a flat bed without using bedrails?: A Little ?Help needed moving to and from a bed to a chair (including a wheelchair)?: A Little ?Help needed standing up from a chair using your arms (e.g., wheelchair or bedside chair)?: A Little ?Help needed to walk in hospital room?: A Little ?Help needed climbing  3-5 steps with a railing? : A Little ?6 Click Score: 19 ? ?  ?End of Session Equipment Utilized During Treatment: Gait belt ?Activity Tolerance: Patient tolerated treatment well ?Patient left: in chair;with call bell/phone within reach;with family/visitor present ?Nurse Communication: Mobility status ?PT Visit Diagnosis: Difficulty in walking, not elsewhere classified (R26.2) ?  ? ?Time: 3903-0092 ?PT Time Calculation (min) (ACUTE ONLY): 21 min ? ? ?Charges:   PT Evaluation ?$PT Eval Moderate Complexity: 1 Mod ?  ?  ?   ? ?Blondell Reveal Kistler PT 03/29/2022  ?Acute Rehabilitation Services ?Pager [phone] ?Office [phone] ? ? ?

## 2022-03-29 NOTE — Progress Notes (Incomplete)
Introduction: Steve Riddle is a 88 yo male with hx of paroxysmal vtach on admiodarone and mexiletine, atrial flutter, cavotricuspid isthmus ablation 03/2021, HF with EF 45%, CAD, mechanical mitral and aortic valves, enterococcus bacteriemia treated with vancomycin and HTN who presented to the ED on 4/5 with complaints of generalized weakness, n/v, and fever.  ? ?History of present illness: Pt reports weakness for the past couple of weeks but then developed N/V with phlegm after dinner (4/4) followed by fever and more episodes of n/v. Morning of admission (4/5) was very weak with difficulty getting out of car. No diarrhea, abd pain, dysuria, hematuria, headache. No trouble breathing until ED admission requiring 2L oxygen increasing to 5L. ? ?Past medical history/allergies/family history pertinent to current illness: ?PMH:  ?Anemia, Arthritis, Diverticulitis, Endocarditis, GI bleed, gout, heart murmur, hyperlipemia, Hyperparathyroidism, HTN, nephrosclerosis, vtach, CTI ablation, HF with 45% EF ?Allergies: ?Been venom - anaphylaxis  ?ACE-Is - cough ?Atorvastatin - muscle pain ?Pravastatin - muscle pain ?Ranexa - tachycardia  ?FH: mother (HTN, stroke), father (HTN) ? ?PTA meds: ?Allopurinol ?Bumetanide ?Calcitriol ?Fish oil ?Gemfibrozil ?Glucosamine ?Metoprolol ?Omeprazole ?Potassium Chloride ?Tamsulosin ?Warfarin ('3mg'$  daily except '6mg'$  on Fridays) ? ?Social hx: pt reports he has quit smoking/drinking. No other drug use ? ?Physical exam/relevant labs, diagnostic testing/imaging: ?On admission: ?-Vitals: Fever max 101 despite home tylenol, HR 104, Resp 22 ?-Tachycardic and irregular rhythm, edema of both legs (left unchanged)..HR improved after arrival but still atrial fibrillation ?-WBC 13.6, lactic acid 2, elevated troponin ?-Scr 2.11 ?-INR 4.6, vitamin K 2.'5mg'$  PO was given per EDP on 4/5 at 1112 ? ?ED ordered Ceftriaxone x1 Otho Bellows x1 for posible pneumonia on chest Xray and given hx of bacteremia abxs broadened to include  Vanc and metronidazole (Vancomycin '1500mg'$  IV x 1 then '1250mg'$  IV Q48H). Later d/c and initiatied Pencillin G after culture and suscetibilities.  ? ?Most recent Vitals 4/7 4:00am: ?-BP 108/73 ?-Temp 98.5 ?-RR 22 ?HR 95 ? ?INR (4/6 4am): 3.1, Hgb decreased to 11.8 ? ?Problem based assessment and plan: ? ?- Problem 1: Streptococcal sepsis  ? ?A. Assessment: Pt has hx of h/o E faecalis bacteremia  in June 2021 ( empirically treated for PV endocarditis with ampicillin and ceftriaxone for 6 weeks, EOT 07/08/2020. Concern for possible sepsis on arrival with increased temperature, heart rate, generalized weakness warranted abx therapy. Blood culture morning of 4/5 positive for streptococcus galloyticus. Chest xray non convincing for pneumonia (mild ill defined opacity within bilateral lung bases) ? ?B. Plan:  ?-Empiric abx given at admission 4/5 CTX 1gm IV q24h, flagyl '500mg'$  IV q12h, vancomycin '1250mg'$  IV q48h day 3 total.  ?-BC high grade strep gallolyticus bacteremia r/o pulmonary valve endocarditis ... planned switch to Ceftriaxone 2g IV daily pending ECG and blood cultures ordered am 4/7. But all abxs D/C'ed 4/7 and initiated Penicillin G 39ml units cont infusion following culture sensitivity test ?-monitor CBC ?-needs colonoscopy for colonic neoplasm but wife said he was not colonoscopy candidate  ? ?- Problem 2: Dyshpagia ? ?A. Assessment: History from patient is vague but describes regurgitation of food sometimes. Today he "choked" on stew beef. Spoke with wife who gives a 10 year history of ''strangling", more commonly with liquids which sounds more like transfer dysphagia. ? ?B. Plan:  ?-barium swallow negative for stricture - continue with TEE 4/10 ? ?- Problem : Supratharapeutic INR  ? ?A. Assessment: INR 4.6 on admission, vitamin K 2.'5mg'$  PO was given per EDP on 4/5 at 1112. Most recent INR (4/7 4am): 2.2 down  from 3.1 4/6 ? ?B. Plan:  ?-received warfarin '6mg'$  1800 4/6  ?-daily INR/PT ?-CBC monitor at least  q72h ?-s/sx bleeding ? ?New Warfarin Dosing 4/7 following negative barium swallow:  ?-Goal 2.5-3.5 ?-PTA regimen '3mg'$  daily except '6mg'$  on Fridays ?-Continue home dose of '6mg'$  4/7 1800  ?-Continue daily PT/INR/CBC monitoring in morning 4/8 ?-s/sx bleeding ?-Bridge with Lovenox '1mg'$ /kgSQ q24h until therapeutic INR.  ? ?- Problem 4: Acute respiratory failure with hypoxia  ? ?A. Assessment: Was placed on supplemental O2 at admission up to 5L but as of AM 4/6, pt denies SOB, took oxygen off and was doing well on room air  ? ?B. Plan: Cont abx and nasal cannula O2 wean as tolerated  ? ?- Problem 5: Elevated troponin  ? ?A. Assessment: No chest pain. BP stable. Troponin was slightly increasing but flat-suspect demand ischemia.  ? ?B. Plan: Continue Warfarin if no contraindications  ? ?- Problem 6: HFpEF ? ?A. Assessment: 5/7 AM appears compensate. ? ?B. Plan: Monitor fluid status. Continue oral Bumex. Monitor intake output daily  ? ?- Problem : Other problems (gout, hyperparathyroidism) controlled and continue home meds as ordered ? ?A. Assessment: ? ?B. Plan:  ? ?- Problem : ? ?A. Assessment: ? ?B. Plan:  ? ?- Problem : ? ?A. Assessment: ? ?B. Plan:  ? ?- Problem : ? ?A. Assessment: ? ?B. Plan:  ? ?- Problem : ? ?A. Assessment: ? ?B. Plan:  ?

## 2022-03-29 NOTE — Progress Notes (Signed)
?  [facility] ? ? ?Reason for visit: Follow up on bacteremia ? ?Interval History: Strep gallolyticus on blood cultures, penicillin sensitive.  Family at bedside.  No rash, no diarrhea.   ?Day 3 total antibiotics ?Esophogram negative for any gross issues ? ?Physical Exam: ?Constitutional:  ?Vitals:  ? 03/29/22 1004 03/29/22 1058  ?BP: 100/68   ?Pulse:    ?Resp: 20   ?Temp:    ?SpO2:  98%  ? patient appears in NAD ?Respiratory: Normal respiratory effort; CTA B ?Cardiovascular: RRR ?GI: soft, nt, nd ? ?Review of Systems: ?Constitutional: negative for fevers and chills ? ?Lab Results  ?Component Value Date  ? WBC 8.7 03/29/2022  ? HGB 12.1 (L) 03/29/2022  ? HCT 35.6 (L) 03/29/2022  ? MCV 100.6 (H) 03/29/2022  ? PLT 139 (L) 03/29/2022  ?  ?Lab Results  ?Component Value Date  ? CREATININE 1.93 (H) 03/29/2022  ? BUN 36 (H) 03/29/2022  ? NA 135 03/29/2022  ? K 3.7 03/29/2022  ? CL 101 03/29/2022  ? CO2 26 03/29/2022  ?  ?Lab Results  ?Component Value Date  ? ALT 32 03/28/2022  ? AST 68 (H) 03/28/2022  ? ALKPHOS 42 03/28/2022  ?  ? ?Microbiology: ?Recent Results (from the past 240 hour(s))  ?Resp Panel by RT-PCR (Flu A&B, Covid) Nasopharyngeal Swab     Status: None  ? Collection Time: 03/27/22  9:34 AM  ? Specimen: Nasopharyngeal Swab; Nasopharyngeal(NP) swabs in vial transport medium  ?Result Value Ref Range Status  ? SARS Coronavirus 2 by RT PCR NEGATIVE NEGATIVE Final  ?  Comment: (NOTE) ?SARS-CoV-2 target nucleic acids are NOT DETECTED. ? ?The SARS-CoV-2 RNA is generally detectable in upper respiratory ?specimens during the acute phase of infection. The lowest ?concentration of SARS-CoV-2 viral copies this assay can detect is ?138 copies/mL. A negative result does not preclude SARS-Cov-2 ?infection and should not be used as the sole basis for treatment or ?other patient management decisions. A negative result may occur with  ?improper specimen collection/handling, submission of specimen  other ?than nasopharyngeal swab, presence of viral mutation(s) within the ?areas targeted by this assay, and inadequate number of viral ?copies(<138 copies/mL). A negative result must be combined with ?clinical observations, patient history, and epidemiological ?information. The expected result is Negative. ? ?Fact Sheet for Patients:  ?EntrepreneurPulse.com.au ? ?Fact Sheet for Healthcare Providers:  ?IncredibleEmployment.be ? ?This test is no t yet approved or cleared by the [place] FDA and  ?has been authorized for detection and/or diagnosis of SARS-CoV-2 by ?FDA under an Emergency Use Authorization (EUA). This EUA will remain  ?in effect (meaning this test can be used) for the duration of the ?COVID-19 declaration under Section 564(b)(1) of the Act, 21 ?U.S.C.section 360bbb-3(b)(1), unless the authorization is terminated  ?or revoked sooner.  ? ? ?  ? Influenza A by PCR NEGATIVE NEGATIVE Final  ? Influenza B by PCR NEGATIVE NEGATIVE Final  ?  Comment: (NOTE) ?The Xpert Xpress SARS-CoV-2/FLU/RSV plus assay is intended as an aid ?in the diagnosis of influenza from Nasopharyngeal swab specimens and ?should not be used as a sole basis for treatment. Nasal washings and ?aspirates are unacceptable for Xpert Xpress SARS-CoV-2/FLU/RSV ?testing. ? ?Fact Sheet for Patients: ?EntrepreneurPulse.com.au ? ?Fact Sheet for Healthcare Providers: ?IncredibleEmployment.be ? ?This test is not yet approved or cleared by the [place] FDA and ?has been authorized for detection and/or diagnosis of SARS-CoV-2 by ?FDA under an Emergency Use Authorization (EUA). This EUA will remain ?  in effect (meaning this test can be used) for the duration of the ?COVID-19 declaration under Section 564(b)(1) of the Act, 21 U.S.C. ?section 360bbb-3(b)(1), unless the authorization is terminated or ?revoked. ? ?Performed at [facility], [address]  [place], [place] [address] ?  ?Blood Culture (routine x 2)     Status: Abnormal  ? Collection Time: 03/27/22  9:34 AM  ? Specimen: BLOOD  ?Result Value Ref Range Status  ? Specimen Description   Final  ?  BLOOD RIGHT ANTECUBITAL ?Performed at [facility], [address], [place], [place] [address] ?  ? Special Requests   Final  ?  BOTTLES DRAWN AEROBIC AND ANAEROBIC Blood Culture adequate volume ?Performed at [facility], [address] [place], [place] [address] ?  ? Culture  Setup Time   Final  ?  GRAM POSITIVE COCCI ?IN BOTH AEROBIC AND ANAEROBIC BOTTLES ?CRITICAL RESULT CALLED TO, READ BACK BY AND VERIFIED WITH: ?PHARMD ELLEN JACKSON 03/28/22'@00'$ :00 BY TW ?Performed at [facility], [place] [address], [place], [place] [address] ?  ? Culture STREPTOCOCCUS GALLOLYTICUS (A)  Final  ? Report Status 03/29/2022 FINAL  Final  ? Organism ID, Bacteria STREPTOCOCCUS GALLOLYTICUS  Final  ?    Susceptibility  ? Streptococcus gallolyticus - MIC*  ?  PENICILLIN 0.12 SENSITIVE Sensitive   ?  CEFTRIAXONE 0.25 SENSITIVE Sensitive   ?  ERYTHROMYCIN <=0.12 SENSITIVE Sensitive   ?  LEVOFLOXACIN 2 SENSITIVE Sensitive   ?  VANCOMYCIN 0.5 SENSITIVE Sensitive   ?  * STREPTOCOCCUS GALLOLYTICUS  ?Blood Culture ID Panel (Reflexed)     Status: Abnormal  ? Collection Time: 03/27/22  9:34 AM  ?Result Value Ref Range Status  ? Enterococcus faecalis NOT DETECTED NOT DETECTED Final  ? Enterococcus Faecium NOT DETECTED NOT DETECTED Final  ? Listeria monocytogenes NOT DETECTED NOT DETECTED Final  ? Staphylococcus species NOT DETECTED NOT DETECTED Final  ? Staphylococcus aureus (BCID) NOT DETECTED NOT DETECTED Final  ? Staphylococcus epidermidis NOT DETECTED NOT DETECTED Final  ? Staphylococcus lugdunensis NOT DETECTED NOT DETECTED Final  ? Streptococcus species DETECTED (A) NOT DETECTED Final  ?  Comment: Not Enterococcus species, Streptococcus agalactiae, Streptococcus pyogenes, or Streptococcus pneumoniae. ?CRITICAL RESULT  CALLED TO, READ BACK BY AND VERIFIED WITH: ?PHARMD ELLEN JACKSON 03/28/22'@00'$ :00 BY TW ?  ? Streptococcus agalactiae NOT DETECTED NOT DETECTED Final  ? Streptococcus pneumoniae NOT DETECTED NOT DETECTED Final  ? Streptococcus pyogenes NOT DETECTED NOT DETECTED Final  ? A.calcoaceticus-baumannii NOT DETECTED NOT DETECTED Final  ? Bacteroides fragilis NOT DETECTED NOT DETECTED Final  ? Enterobacterales NOT DETECTED NOT DETECTED Final  ? Enterobacter cloacae complex NOT DETECTED NOT DETECTED Final  ? Escherichia coli NOT DETECTED NOT DETECTED Final  ? Klebsiella aerogenes NOT DETECTED NOT DETECTED Final  ? Klebsiella oxytoca NOT DETECTED NOT DETECTED Final  ? Klebsiella pneumoniae NOT DETECTED NOT DETECTED Final  ? Proteus species NOT DETECTED NOT DETECTED Final  ? Salmonella species NOT DETECTED NOT DETECTED Final  ? Serratia marcescens NOT DETECTED NOT DETECTED Final  ? Haemophilus influenzae NOT DETECTED NOT DETECTED Final  ? Neisseria meningitidis NOT DETECTED NOT DETECTED Final  ? Pseudomonas aeruginosa NOT DETECTED NOT DETECTED Final  ? Stenotrophomonas maltophilia NOT DETECTED NOT DETECTED Final  ? Candida albicans NOT DETECTED NOT DETECTED Final  ? Candida auris NOT DETECTED NOT DETECTED Final  ? Candida glabrata NOT DETECTED NOT DETECTED Final  ? Candida krusei NOT DETECTED  NOT DETECTED Final  ? Candida parapsilosis NOT DETECTED NOT DETECTED Final  ? Candida tropicalis NOT DETECTED NOT DETECTED Final  ? Cryptococcus neoformans/gattii NOT DETECTED NOT DETECTED Final  ?  Comment: Performed at [facility], [address], [place], [place] [address]  ?Blood Culture (routine x 2)     Status: Abnormal  ? Collection Time: 03/27/22 10:35 AM  ? Specimen: BLOOD RIGHT HAND  ?Result Value Ref Range Status  ? Specimen Description   Final  ?  BLOOD RIGHT HAND ?Performed at [facility], [address] [place], [place] [address] ?  ? Special Requests   Final  ?  BOTTLES DRAWN AEROBIC AND ANAEROBIC Blood  Culture adequate volume ?Performed at [facility], [address], [place], [place] [address] ?  ? Culture  Setup Time   Final  ?  GRAM POSITIVE COCCI ?ANAEROBIC BOTTLE ONLY ?CRITICAL VALUE NOTED.  VALUE I

## 2022-03-29 NOTE — Progress Notes (Signed)
Subjective: ?Sinus drainage all night long. ? ?Objective: ?Vital signs in last 24 hours: ?Temp:  [97.6 ?F (36.4 ?C)-98.7 ?F (37.1 ?C)] 98.5 ?F (36.9 ?C) (04/07 0447) ?Pulse Rate:  [89-107] 94 (04/07 0447) ?Resp:  [20-30] 22 (04/07 0447) ?BP: (98-109)/(65-81) 108/73 (04/07 0447) ?SpO2:  [92 %-96 %] 94 % (04/07 0447) ?Last BM Date : 03/24/22 ? ?Intake/Output from previous day: ?04/06 0701 - 04/07 0700 ?In: 63 [P.O.:660; IV Piggyback:100] ?Out: 1800 [Urine:1800] ?Intake/Output this shift: ?No intake/output data recorded. ? ?General appearance: alert and no distress ?GI: soft, non-tender; bowel sounds normal; no masses,  no organomegaly ? ?Lab Results: ?Recent Labs  ?  03/27/22 ?0814 03/28/22 ?0422 03/29/22 ?4818  ?WBC 13.6* 9.6 8.7  ?HGB 12.6* 11.8* 12.1*  ?HCT 37.1* 36.3* 35.6*  ?PLT 151 129* 139*  ? ?BMET ?Recent Labs  ?  03/27/22 ?5631 03/28/22 ?0422 03/29/22 ?4970  ?NA 137 137 135  ?K 3.9 4.3 3.7  ?CL 102 103 101  ?CO2 '25 26 26  '$ ?GLUCOSE 156* 104* 106*  ?BUN 30* 33* 36*  ?CREATININE 2.11* 1.93* 1.93*  ?CALCIUM 9.8 8.9 8.9  ? ?LFT ?Recent Labs  ?  03/28/22 ?0422  ?PROT 5.3*  ?ALBUMIN 3.0*  ?AST 68*  ?ALT 32  ?ALKPHOS 42  ?BILITOT 0.7  ? ?PT/INR ?Recent Labs  ?  03/28/22 ?0422 03/29/22 ?0433  ?LABPROT 31.9* 24.4*  ?INR 3.1* 2.2*  ? ?Hepatitis Panel ?No results for input(s): HEPBSAG, HCVAB, HEPAIGM, HEPBIGM in the last 72 hours. ?C-Diff ?No results for input(s): CDIFFTOX in the last 72 hours. ?Fecal Lactopherrin ?No results for input(s): FECLLACTOFRN in the last 72 hours. ? ?Studies/Results: ?CT HEAD WO CONTRAST (5MM) ? ?Result Date: 03/27/2022 ?CLINICAL DATA:  Mental status change, unknown cause. EXAM: CT HEAD WITHOUT CONTRAST TECHNIQUE: Contiguous axial images were obtained from the base of the skull through the vertex without intravenous contrast. RADIATION DOSE REDUCTION: This exam was performed according to the departmental dose-optimization program which includes automated exposure control, adjustment of the mA  and/or kV according to patient size and/or use of iterative reconstruction technique. COMPARISON:  Head CT 01/02/2013. FINDINGS: Brain: Mild-to-moderate generalized cerebral atrophy. Comparatively mild cerebellar atrophy. Redemonstrated small chronic cortical/subcortical infarct within the right parietal lobe (series 2, image 22). Mild patchy and ill-defined hypoattenuation within the cerebral white matter, nonspecific but compatible with chronic small vessel ischemic disease. Redemonstrated small chronic infarct within the left cerebellar hemisphere (series 2, image 10). There is no acute intracranial hemorrhage. No acute demarcated cortical infarct. No extra-axial fluid collection. No evidence of an intracranial mass. No midline shift. Vascular: No hyperdense vessel.  Atherosclerotic calcifications. Skull: No calvarial fracture or focal suspicious osseous lesion. Redemonstrated prominent arachnoid granulations within the bilateral occipital calvarium. Sinuses/Orbits: Visualized orbits show no acute finding. Minimal mucosal thickening within the bilateral ethmoid and frontal sinuses. IMPRESSION: No evidence of acute intracranial abnormality. Redemonstrated small chronic cortical/subcortical infarct within the right parietal lobe. Mild chronic small ischemic changes within the cerebral white matter, progressed from the prior head CT of 01/02/2013. Redemonstrated small chronic infarct within the left cerebellar hemisphere. Mild-to-moderate generalized cerebral atrophy. Comparatively mild cerebellar atrophy. Electronically Signed   By: Kellie Simmering D.O.   On: 03/27/2022 17:55  ? ?DG Chest Port 1 View ? ?Result Date: 03/27/2022 ?CLINICAL DATA:  Provided history: Questionable sepsis-evaluate abnormality. Nausea/vomiting, weakness. Fever. EXAM: PORTABLE CHEST 1 VIEW COMPARISON:  Prior chest radiographs 08/22/2020 and earlier. FINDINGS: Prior median sternotomy. Cardiomegaly. Aortic atherosclerosis. Mild ill-defined opacity  within the bilateral lung  bases. No evidence of pleural effusion or pneumothorax. No acute bony abnormality identified. IMPRESSION: Mild ill-defined opacity within the bilateral lung bases. This may reflect atelectasis, however, pneumonia cannot be excluded. Cardiomegaly. Aortic Atherosclerosis (ICD10-I70.0). Electronically Signed   By: Kellie Simmering D.O.   On: 03/27/2022 10:20  ? ?ECHOCARDIOGRAM COMPLETE ? ?Result Date: 03/28/2022 ?   ECHOCARDIOGRAM REPORT   Patient Name:   Steve Riddle. Date of Exam: 03/28/2022 Medical Rec #:  [id number]       Height:       64.0 in Accession #:    [id number]      Weight:       190.0 lb Date of Birth:  [birth year]       BSA:          1.914 m? Patient Age:    88 years        BP:           102/56 mmHg Patient Gender: M               HR:           90 bpm. Exam Location:  Inpatient Procedure: 2D Echo, Cardiac Doppler and Color Doppler Indications:    Afib, CHF, Cardiomegaly  History:        Patient has no prior history of Echocardiogram examinations.                 CHF, CAD, Arrythmias:Atrial Fibrillation; Risk                 Factors:Hypertension.                 Aortic Valve: 21 mm St. Jude mechanical valve is present in the                 aortic position. Procedure Date: 2005 (followed at [facility]).                 Mitral Valve: 29 mm St. Jude mechanical valve valve is present                 in the mitral position. Procedure Date: 2005 (followed at [facility]).  Sonographer:    Jyl Heinz Referring Phys: [id number] DAVID MANUEL ORTIZ  Sonographer Comments: Technically difficult study due to poor echo windows. IMPRESSIONS  1. Left ventricular ejection fraction, by estimation, is 20 to 25%. Left ventricular ejection fraction by 2D MOD biplane is 24.5 %. The left ventricle has severely decreased function. The left ventricle demonstrates global hypokinesis with inferior akinesis. The left ventricular internal cavity size was moderately dilated. There is mild left ventricular hypertrophy. Left  ventricular diastolic function could not be evaluated.  2. Right ventricular systolic function is mildly reduced. The right ventricular size is normal. There is mildly elevated pulmonary artery systolic pressure. The estimated right ventricular systolic pressure is 94.7 mmHg.  3. Left atrial size was severely dilated.  4. Right atrial size was mildly dilated.  5. The mitral valve has been repaired/replaced. Trivial mitral valve regurgitation. Mild mitral stenosis. The mean mitral valve gradient is 6.0 mmHg with average heart rate of 92 bpm. There is a 29 mm St. Jude mechanical valve present in the mitral position. Procedure Date: 2005 (followed at [facility]). Echo findings are consistent with normal structure and function of the mitral valve prosthesis.  6. The aortic valve has been repaired/replaced. Aortic valve regurgitation is trivial. Mild aortic valve stenosis. There is a 21 mm St. Jude mechanical valve  present in the aortic position. Procedure Date: 2005 (followed at [facility] Echo findings are consistent with normal structure and function of the aortic valve prosthesis. Aortic valve mean gradient measures 15.0 mmHg. Aortic valve Vmax measures 2.50 m/s. (note the gradient may be falsely lower due to reduced cardiac output)  7. Aortic dilatation noted. There is borderline dilatation of the ascending aorta, measuring 38 mm.  8. The inferior vena cava is dilated in size with <50% respiratory variability, suggesting right atrial pressure of 15 mmHg. Comparison(s): Changes from prior study are noted. 01/10/2021 ([facility]) - LVEF 45-50%, AOV mean gradient 27 mmHg, MV mean gradient 2 mmHg, subequent TEE on 03/26/2021 showed normally functioning aortic and mechanical mitral valves, with MVR gradient of 4 mmHg. FINDINGS  Left Ventricle: Left ventricular ejection fraction, by estimation, is 20 to 25%. Left ventricular ejection fraction by 2D MOD biplane is 24.5 %. The left ventricle has severely decreased function. The left ventricle  demonstrates global hypokinesis. The left ventricular internal cavity size was moderately dilated. There is mild left ventricular hypertrophy. Left ventricular diastolic function could not be evaluated due t

## 2022-03-29 NOTE — Progress Notes (Signed)
?PROGRESS NOTE ?Steve Riddle.  AST:[id number] DOB: 1929-09-21 DOA: 03/27/2022 ?PCP: [name], MD  ? ?Brief Narrative/Hospital Course: ?66 yoM. W/ Class I obesity, gout, secondary hyperparathyroidism,paroxysmal ventricular tachycardia on amiodarone and mexiletine, history of atrial flutter, history of CTI ablation April 2022 at [facility], history of systolic heart failure with an EF of 45%, most recently diagnosed with chronic heart failure with preserved EF, hyperlipidemia, hypertension,CAD, history of COVID myocarditis, history of mechanical heart valves, history of Enterococcus bacteremia treated empirically with vancomycin presented with generalized weakness progressively worse for couple of weeks, developed nausea and vomiting after eating grits for dinner followed by fever and more episodes of nausea vomiting, so came to the ED for evaluation where he was also short of breath placed on 2 L nasal cannula.   ?Son reports he had transient episodes of confusion earlier but at baseline in the ED. In the ED further work-up showed sepsis with fever of one 101.8, elevated creatinine 2.1 BNP 843, troponin  44> 67, lactic acidosis 2.0> 1.8, leukocytosis WBC 13.6, INR 4.6 UA with normal WBC BCID positive [place]. ? ?Subjective: ?Seen and examined this morning he had a barium x-ray, wife daughter and son at the bedside ?Afebrile overnight ?Pt choking on food- has been chronic problems per family. ? ?Assessment and Plan: ?Principal Problem: ?  Streptococcal sepsis, unspecified ([place]) ?Active Problems: ?  Hyperlipidemia ?  Hypertension ?  Class 1 obesity ?  Atrial fibrillation ([place]) ?  Chronic gouty arthritis ?  Chronic heart failure with preserved ejection fraction ([place]) ?  Coronary artery disease ?  Paroxysmal ventricular tachycardia ([place]) ?  S/P MVR (mitral valve replacement) ?  S/P AVR ?  Secondary hyperparathyroidism ([place]) ?  Supratherapeutic INR ?  Elevated troponin ?  Acute respiratory failure with hypoxia ([place]) ?   Thrombocytopenia ([place]) ?  CKD (chronic kidney disease) stage 4, GFR 15-29 ml/min (HCC) ?  Streptococcal bacteremia ?  Acute bacterial endocarditis ?  ?Streptococcal sepsis, unspecified ?High grade strep gallolyticus bacteremia r/o PV endocarditis: ?BCID positive with Streptococcus 4/4.Chest x-ray with "mild ill-defined opacity within the bilateral lung bases"-not convincing for pneumonia.  Previous history of enterococcal endocarditis in 2021, and with 2 mechanical valves high risk for endocarditis, ID consulted, needs TEE. Cont rocephin 2 gm, repeat blood cx 4/7. Needs colonoscopy as well.  Patient's wife tells me that " he is not a candidate for colonoscopy" ? ?Dysphagia/choking while eating: Esophagogram does not show any stricture but some dysmotility, no need for EGD as per GI, speech has been consulted.   ? ?CAD ?Hyperlipidemia ?Hypertension ?Mildly elevated troponin-demand ischemia: ?no chest pain.  BP stable, Troponin was SLIGHTLY + but flat-suspected demand ischemia.continue on his Coumadin. ? ?Chronic heart failure with preserved ejection fraction: This morning appears compensated.  Monitor fluid status closely.  Continues oral Bumex.  Monitor intake output Daily balance ?Net IO Since Admission: 199.71 mL [03/29/22 1103]  ? ?Paroxysmal ventricular tachycardia  ?Atrial fibrillation: ?S/P MVR ?S/P AVR: ?Previous history of endocarditis ?Supratherapeutic on admission 4.6 S/P vitamin K in the ED. ?Continue Coumadin pharmacy managing.  Continue antibiotics as #1. ?Subtherapeutic INR so we will bridge with Lovenox- GI okay.   ? ?Chronic gouty arthritis: Stable continue allopurinol ?Secondary hyperparathyroidism: Monitor calcium p.o. continue calcitriol ? ?Acute respiratory failure with hypoxia: On 2 L nasal cannula wean as tolerated ? ?Thrombocytopenia: From sepsis.  Monitor ?Recent Labs  ?Lab 03/27/22 ?[id number] 03/28/22 ?0422 03/29/22 ?1194  ?PLT 151 129* 139*  ?  ?CKD stage 4:  Creatinine appears to be at baseline.   Monitor ?Recent Labs  ?Lab 03/27/22 ?2376 03/28/22 ?0422 03/29/22 ?2831  ?BUN 30* 33* 36*  ?CREATININE 2.11* 1.93* 1.93*  ?  ?Class I Obesity:Patient's Body mass index is 32.61 kg/m?. : Will benefit with PCP follow-up, weight loss  healthy lifestyle and outpatient sleep evaluation. ? ?DVT prophylaxis: coumadin ?Code Status:   Code Status: Full Code ?Family Communication: plan of care discussed with patient  at bedside.  Patient's wife and son and daughter updated at the bedside ?Patient status is: Inpatient level of care: Progressive  ?Remains inpatient because: Ongoing management of sepsis with IV antibiotics and further work-up ?Patient currently not stable ? ?Dispo: The patient is from: Home ?           Anticipated disposition: TBD ? ?Mobility Assessment (last 72 hours)   ? ? Mobility Assessment   ? ? [place] Name 03/29/22 [phone] 03/28/22 0847 03/27/22 1703  ?  ?  ? Does patient have an order for bedrest or is patient medically unstable No - Continue assessment No - Continue assessment No - Continue assessment    ? What is the highest level of mobility based on the progressive mobility assessment? Level 3 (Stands with assist) - Balance while standing  and cannot march in place Level 1 (Bedfast) - Unable to balance while sitting on edge of bed Level 1 (Bedfast) - Unable to balance while sitting on edge of bed    ? Is the above level different from baseline mobility prior to current illness? Yes - Recommend PT order Yes - Recommend PT order --    ? ?  ?  ? ?  ? ?Objective: ?Vitals last 24 hrs: ?Vitals:  ? 03/29/22 0210 03/29/22 0447 03/29/22 1004 03/29/22 1058  ?BP: 109/81 108/73 100/68   ?Pulse: 97 94    ?Resp: 20 (!) 22 20   ?Temp: 98.7 ?F (37.1 ?C) 98.5 ?F (36.9 ?C)    ?TempSrc: Oral Oral    ?SpO2: 92% 94%  98%  ?Weight:      ?Height:      ? ?Weight change:  ? ?Physical Examination: ?General exam: AA, elderly, pleasant older than stated age, weak appearing. ?HEENT:Oral mucosa moist, Ear/Nose WNL grossly, dentition  normal. ?Respiratory system: bilaterally diminished,no use of accessory muscle ?Cardiovascular system: S1 & S2 +, No JVD,. ?Gastrointestinal system: Abdomen soft,NT,ND, BS+ ?Nervous System:Alert, awake, moving extremities and grossly nonfocal ?Extremities: edema neg,distal peripheral pulses palpable.  ?Skin: No rashes,no icterus. ?MSK: Normal muscle bulk,tone, power ? ? ?Medications reviewed:  ?Scheduled Meds: ? allopurinol  400 mg Oral Daily  ? amiodarone  200 mg Oral Daily  ? bumetanide  2 mg Oral q1800  ? bumetanide  4 mg Oral Q breakfast  ? calcitRIOL  0.5 mcg Oral Daily  ? enoxaparin (LOVENOX) injection  1 mg/kg Subcutaneous Daily  ? loratadine  10 mg Oral Daily  ? mouth rinse  15 mL Mouth Rinse BID  ? mexiletine  200 mg Oral TID  ? pantoprazole  40 mg Oral Daily  ? potassium chloride SA  20 mEq Oral BID  ? tamsulosin  0.4 mg Oral Daily  ? Warfarin - Pharmacist Dosing Inpatient   Does not apply [id number]  ? ?Continuous Infusions: ? penicillin g continuous IV infusion 6 Million Units (03/29/22 1023)  ?  ?Diet Order   ? ?       ?  DIET DYS 3 Room service appropriate? Yes; Fluid consistency: Thin  Diet effective now       ?  ? ?  ?  ? ?  ? ? ?  Unresulted Labs (From admission, onward)  ? ?  Start     Ordered  ? 04/01/22 0500  CBC  Every 72 hours,   R     ?Question:  Specimen collection method  Answer:  Lab=Lab collect  ? 03/29/22 1006  ? 03/30/22 0500  CBC  Daily,   R     ?Question:  Specimen collection method  Answer:  Lab=Lab collect  ? 03/29/22 1006  ? 03/29/22 2130  Basic metabolic panel  Daily,   R     ?Question:  Specimen collection method  Answer:  Lab=Lab collect  ? 03/28/22 1006  ? 03/28/22 0500  Protime-INR  Daily,   R     ? 04/16/2022 1617  ? ?  ?  ? ?  ?Data Reviewed: I have personally reviewed following labs and imaging studies ?CBC: ?Recent Labs  ?Lab 2022/04/16 ?[id number] 03/28/22 ?0422 03/29/22 ?8469  ?WBC 13.6* 9.6 8.7  ?NEUTROABS 13.1* 9.1* 7.5  ?HGB 12.6* 11.8* 12.1*  ?HCT 37.1* 36.3* 35.6*  ?MCV 99.5 103.1*  100.6*  ?PLT 151 129* 139*  ? ?Basic Metabolic Panel: ?Recent Labs  ?Lab 04-16-2022 ?[id number] 04/16/2022 ?1300 03/28/22 ?0422 03/29/22 ?0433  ?NA 137  --  137 135  ?K 3.9  --  4.3 3.7  ?CL 102  --  103 101  ?CO

## 2022-03-29 NOTE — Plan of Care (Signed)
?  Problem: Clinical Measurements: Goal: Diagnostic test results will improve Outcome: Progressing   Problem: Activity: Goal: Risk for activity intolerance will decrease Outcome: Progressing   Problem: Coping: Goal: Level of anxiety will decrease Outcome: Progressing   Problem: Pain Managment: Goal: General experience of comfort will improve Outcome: Progressing   Problem: Safety: Goal: Ability to remain free from injury will improve Outcome: Progressing   

## 2022-03-30 DIAGNOSIS — R6520 Severe sepsis without septic shock: Secondary | ICD-10-CM | POA: Diagnosis not present

## 2022-03-30 DIAGNOSIS — A409 Streptococcal sepsis, unspecified: Secondary | ICD-10-CM | POA: Diagnosis not present

## 2022-03-30 DIAGNOSIS — J9601 Acute respiratory failure with hypoxia: Secondary | ICD-10-CM | POA: Diagnosis not present

## 2022-03-30 LAB — CBC
HCT: 34.5 % — ABNORMAL LOW (ref 39.0–52.0)
Hemoglobin: 11.7 g/dL — ABNORMAL LOW (ref 13.0–17.0)
MCH: 33.7 pg (ref 26.0–34.0)
MCHC: 33.9 g/dL (ref 30.0–36.0)
MCV: 99.4 fL (ref 80.0–100.0)
Platelets: 140 10*3/uL — ABNORMAL LOW (ref 150–400)
RBC: 3.47 MIL/uL — ABNORMAL LOW (ref 4.22–5.81)
RDW: 14.6 % (ref 11.5–15.5)
WBC: 7 10*3/uL (ref 4.0–10.5)
nRBC: 0 % (ref 0.0–0.2)

## 2022-03-30 LAB — BASIC METABOLIC PANEL
Anion gap: 5 (ref 5–15)
BUN: 36 mg/dL — ABNORMAL HIGH (ref 8–23)
CO2: 27 mmol/L (ref 22–32)
Calcium: 8.6 mg/dL — ABNORMAL LOW (ref 8.9–10.3)
Chloride: 102 mmol/L (ref 98–111)
Creatinine, Ser: 1.72 mg/dL — ABNORMAL HIGH (ref 0.61–1.24)
GFR, Estimated: 37 mL/min — ABNORMAL LOW (ref >60)
Glucose, Bld: 112 mg/dL — ABNORMAL HIGH (ref 70–99)
Potassium: 3.4 mmol/L — ABNORMAL LOW (ref 3.5–5.1)
Sodium: 134 mmol/L — ABNORMAL LOW (ref 135–145)

## 2022-03-30 LAB — CULTURE, RESPIRATORY W GRAM STAIN: Culture: NORMAL

## 2022-03-30 LAB — PROTIME-INR
INR: 3.8 — ABNORMAL HIGH (ref 0.8–1.2)
Prothrombin Time: 37.1 seconds — ABNORMAL HIGH (ref 11.4–15.2)

## 2022-03-30 MED ORDER — BUMETANIDE 1 MG PO TABS
2.0000 mg | ORAL_TABLET | Freq: Every day | ORAL | Status: DC
  Administered 2022-03-31 – 2022-04-01 (×2): 2 mg via ORAL
  Administered 2022-04-02: 1 mg via ORAL
  Administered 2022-04-04: 2 mg via ORAL
  Filled 2022-03-30 (×5): qty 2

## 2022-03-30 MED ORDER — WARFARIN SODIUM 2 MG PO TABS
2.0000 mg | ORAL_TABLET | Freq: Once | ORAL | Status: AC
  Administered 2022-03-30: 2 mg via ORAL
  Filled 2022-03-30: qty 1

## 2022-03-30 NOTE — Progress Notes (Signed)
ANTICOAGULATION CONSULT NOTE - Follow Up Consult ? ?Pharmacy Consult for Warfarin ?Indication: mechanical MVR/AVR, atrial flutter  ? ?Allergies  ?Allergen Reactions  ? Bee Venom Anaphylaxis  ? Ace Inhibitors Cough  ? Atorvastatin Other (See Comments)  ?  Muscle Pain  ? Pravastatin Other (See Comments)  ?  Muscle Pain  ? Ranexa [Ranolazine] Other (See Comments)  ?  Tachycardia  ? ? ?Patient Measurements: ?Height: '5\' 4"'$  (162.6 cm) ?Weight: 86.2 kg (190 lb) ?IBW/kg (Calculated) : 59.2 ?Heparin Dosing Weight:  ? ?Vital Signs: ?Temp: 98.4 ?F (36.9 ?C) (04/08 0544) ?Temp Source: Oral (04/08 0544) ?BP: 100/63 (04/08 0544) ?Pulse Rate: 97 (04/08 0544) ? ?Labs: ?Recent Labs  ?  03/27/22 ?1415 03/28/22 ?0422 03/28/22 ?0422 03/29/22 ?2035 03/30/22 ?5974  ?HGB  --  11.8*   < > 12.1* 11.7*  ?HCT  --  36.3*  --  35.6* 34.5*  ?PLT  --  129*  --  139* 140*  ?LABPROT  --  31.9*  --  24.4* 37.1*  ?INR  --  3.1*  --  2.2* 3.8*  ?CREATININE  --  1.93*  --  1.93* 1.72*  ?TROPONINIHS 67*  --   --   --   --   ? < > = values in this interval not displayed.  ? ? ?Estimated Creatinine Clearance: 27.7 mL/min (A) (by C-G formula based on SCr of 1.72 mg/dL (H)). ? ? ?Medications:  ?Scheduled:  ? allopurinol  400 mg Oral Daily  ? amiodarone  200 mg Oral Daily  ? [START ON 03/31/2022] bumetanide  2 mg Oral Q breakfast  ? calcitRIOL  0.5 mcg Oral Daily  ? loratadine  10 mg Oral Daily  ? mouth rinse  15 mL Mouth Rinse BID  ? mexiletine  200 mg Oral TID  ? pantoprazole  40 mg Oral Daily  ? potassium chloride SA  20 mEq Oral BID  ? tamsulosin  0.4 mg Oral Daily  ? Warfarin - Pharmacist Dosing Inpatient   Does not apply [id number]  ? ?Infusions:  ? penicillin g continuous IV infusion 6 Million Units (03/29/22 2321)  ? ? ?Assessment: ?58 y/oM with PMH of vtach, aflutter, MVR/AVR, gout, HTN, HLD, anemia, HF with EF 45%, CAD,  history of Enterococcus bacteremia who presented to [facility] ED with n/v, weakness, fever. INR on admission 4.6. Vitamin K 2.'5mg'$  PO was given  per EDP on 4/5 at 1112. Pharmacy consulted for warfarin dosing this admission.  Barium swallow negative for stricture. Per discussion with Dr. Lupita Leash and Benson [place], use Enoxaparin for bridging while INR subtherapeutic.  ?  ?PTA warfarin regimen '3mg'$  daily except '6mg'$  on Fridays with goal INR 2.5-3.5 (regimen and goal range confirmed with Dr. Buel Ream office on 4/6) ?  ?Today, 03/30/2022:  ?INR 3.8, now supra-therapeutic  ?Vit K given 4/5 for supratherapeutic INR, then got Warfarin '6mg'$  x2 doses ?CBC: Hgb low/stable at 11.7. Pltc low, but increased to 140K ?No bleeding issues noted ?SCr 1.72, continues to decrease ?Drug interactions: Penicillin G can increase INR.  Amiodarone is chronic home med along with warfarin and likely doesn't contribute to acute changes.  Metronidazole x2 doses and azithromycin x1 dose (4/5-4/6) may have contributed to increased INR.  ? ? ?Goal of Therapy:  ?INR 2.5-3.5 ?Monitor platelets by anticoagulation protocol: Yes ?  ?Plan:  ?Lovenox bridge already d/c by MD ?Warfarin 2 mg PO x 1 today at 16:00 ?Daily PT/INR. ?Monitor for signs and symptoms of bleeding. ? ? ?Gretta Arab PharmD, BCPS ?Clinical  Pharmacist ?[facility] [phone] ?03/30/2022 9:59 AM ? ?

## 2022-03-30 NOTE — Progress Notes (Signed)
?PROGRESS NOTE ?Steve Riddle.  KNL:[id number] DOB: [birth year] DOA: 03/27/2022 ?PCP: [name], MD  ? ?Brief Narrative/Hospital Course: ?61 yoM. W/ Class I obesity, gout, secondary hyperparathyroidism,paroxysmal ventricular tachycardia on amiodarone and mexiletine, history of atrial flutter, history of CTI ablation April 2022 at [facility], history of systolic heart failure with an EF of 45%, most recently diagnosed with chronic heart failure with preserved EF, hyperlipidemia, hypertension,CAD, history of COVID myocarditis, history of mechanical heart valves, history of Enterococcus bacteremia treated empirically with vancomycin presented with generalized weakness progressively worse for couple of weeks, developed nausea and vomiting after eating grits for dinner followed by fever and more episodes of nausea vomiting, so came to the ED for evaluation where he was also short of breath placed on 2 L nasal cannula.   ?Son reports he had transient episodes of confusion earlier but at baseline in the ED. In the ED further work-up showed sepsis with fever of one 101.8, elevated creatinine 2.1 BNP 843, troponin  44> 67, lactic acidosis 2.0> 1.8, leukocytosis WBC 13.6, INR 4.6 UA with normal WBC BCID positive [place]. ? ?Subjective: ?Seen and examined this morning.  Resting comfortably ?Reports he gets coughing episodes while in the bed so likes to use bedside chair ?Overnight no fever, blood pressure 85/66 8 PM last night ?INR up at 3.8 ? ?Assessment and Plan: ?Principal Problem: ?  Streptococcal sepsis, unspecified ([place]) ?Active Problems: ?  Hyperlipidemia ?  Hypertension ?  Class 1 obesity ?  Atrial fibrillation ([place]) ?  Chronic gouty arthritis ?  Chronic heart failure with preserved ejection fraction ([place]) ?  Coronary artery disease ?  Paroxysmal ventricular tachycardia ([place]) ?  S/P MVR (mitral valve replacement) ?  S/P AVR ?  Secondary hyperparathyroidism ([place]) ?  Supratherapeutic INR ?  Elevated troponin ?  Acute  respiratory failure with hypoxia ([place]) ?  Thrombocytopenia ([place]) ?  CKD (chronic kidney disease) stage 4, GFR 15-29 ml/min (HCC) ?  Streptococcal bacteremia ?  Acute bacterial endocarditis ?  ?High grade strep gallolyticus bacteremia/Sepsis r/o Endocarditis: ?Blood cx 4/4 positive.repeat chest x-ray 4/7 pending.  Chest x-ray in the ED no convincing pneumonia.   ?Patient with previous history of enterococcal endocarditis in 2021, and with 2 mechanical valves high risk for endocarditis.planning for TEE.  ID on board continue with  Abx- changed from Rocephin > Pen g 03/29/22. Needs colonoscopy but patient and family reluctant for c-scope wife states  "he is not a candidate for colonoscopy"-informed Dr. Benson Norway from GI and he has signed off.  I again discussed this morning and explained and he does not want to have colonoscopy ? ?Dysphagia/choking while eating:Esophagogram w/ no stricture,but some dysmotility,no need for EGD as per GI, speech following, on Dys 3 diet. ? ?CAD ?Hyperlipidemia ?Hypertension ?Mildly elevated troponin-demand ischemia: ?no chest pain.  BP noted to be hypotensive overnight-patient not taking his evening Bumex- will dc. Cut back bumex to 4> 2 mg daily for now. Troponin was SLIGHTLY + but flat-suspected demand ischemia.continue on his Coumadin. ? ?Chronic heart failure with preserved ejection fraction:compensated.  Monitor fluid status closely.  Continues oral Bumex- give soft bp cut back bumex and monitor intake output Daily balance ?Net IO Since Admission: -426.26 mL [03/30/22 0806]  ? ?Paroxysmal ventricular tachycardia  ?Atrial fibrillation: ?S/P MVR ?S/P AVR: ?Previous history of endocarditis ?Supratherapeutic on admission 4.6 S/P vitamin K in the ED. ?Continue Coumadin pharmacy managing. ?Subtherapeutic INR-so current dose of Lovenox INR therapeutic.  Monitor ?Recent Labs  ?Lab 03/27/22 ?7902 03/28/22 ?  0422 03/29/22 ?2831 03/30/22 ?5176  ?INR 4.6* 3.1* 2.2* 3.8*  ?  ?Chronic gouty arthritis:  Stable continue allopurinol ?Secondary hyperparathyroidism: Monitor calcium p.o. continue calcitriol ?Hypokalemia continuing 20 KCl twice daily. ?Acute respiratory failure with hypoxia: On 2 L nasal cannula wean as tolerated ? ?Thrombocytopenia: From sepsis,resolving. ?Recent Labs  ?Lab 03/27/22 ?1607 03/28/22 ?0422 03/29/22 ?3710 03/30/22 ?6269  ?PLT 151 129* 139* 140*  ? ?CKD stage 4: Creatinine appears to be at baseline/improving.  Monitor ?Recent Labs  ?Lab 03/27/22 ?4854 03/28/22 ?0422 03/29/22 ?6270 03/30/22 ?3500  ?BUN 30* 33* 36* 36*  ?CREATININE 2.11* 1.93* 1.93* 1.72*  ? ?Class I Obesity:Patient's Body mass index is 32.61 kg/m?. : Will benefit with PCP follow-up, weight loss  healthy lifestyle and outpatient sleep evaluation. ? ?DVT prophylaxis: coumadin ?Code Status:   Code Status: Full Code ?Family Communication: plan of care discussed with patient  at bedside.  Patient's wife and son and daughter updated at the bedside ?Patient status is: Inpatient level of care: Progressive  ?Remains inpatient because: Ongoing management of sepsis with IV antibiotics and further work-up ?Patient currently not stable ? ?Dispo: The patient is from: Home ?           Anticipated disposition: TBD ? ?Mobility Assessment (last 72 hours)   ? ? Mobility Assessment   ? ? [place] Name 03/29/22 2045 03/29/22 1101 03/29/22 0752 03/28/22 0847 03/27/22 1703  ? Does patient have an order for bedrest or is patient medically unstable No - Continue assessment -- No - Continue assessment No - Continue assessment No - Continue assessment  ? What is the highest level of mobility based on the progressive mobility assessment? Level 5 (Walks with assist in room/hall) - Balance while stepping forward/back and can walk in room with assist - Complete Level 5 (Walks with assist in room/hall) - Balance while stepping forward/back and can walk in room with assist - Complete Level 3 (Stands with assist) - Balance while standing  and cannot march in place  Level 1 (Bedfast) - Unable to balance while sitting on edge of bed Level 1 (Bedfast) - Unable to balance while sitting on edge of bed  ? Is the above level different from baseline mobility prior to current illness? Yes - Recommend PT order -- Yes - Recommend PT order Yes - Recommend PT order --  ? ?  ?  ? ?  ? ?Objective: ?Vitals last 24 hrs: ?Vitals:  ? 03/29/22 1259 03/29/22 1719 03/29/22 2038 03/30/22 0544  ?BP: 104/72  (!) 85/66 100/63  ?Pulse: 95  96 97  ?Resp: (!) '22  18 20  '$ ?Temp: 98 ?F (36.7 ?C)  98 ?F (36.7 ?C) 98.4 ?F (36.9 ?C)  ?TempSrc: Oral  Oral Oral  ?SpO2: 90% 92% 91% 91%  ?Weight:      ?Height:      ? ?Weight change:  ? ?Physical Examination: ?General exam: AA OX3, elderly,older than stated age, weak appearing. ?HEENT:Oral mucosa moist, Ear/Nose WNL grossly, dentition normal. ?Respiratory system: bilaterally clear,no use of accessory muscle ?Cardiovascular system: S1 & S2 +, No JVD,. ?Gastrointestinal system: Abdomen soft,NT,ND, BS+ ?Nervous System:Alert, awake, moving extremities and grossly nonfocal ?Extremities: edema neg,distal peripheral pulses palpable.  ?Skin: No rashes,no icterus. ?MSK: Normal muscle bulk,tone, power ? ? ?Medications reviewed:  ?Scheduled Meds: ? allopurinol  400 mg Oral Daily  ? amiodarone  200 mg Oral Daily  ? bumetanide  2 mg Oral q1800  ? bumetanide  4 mg Oral Q breakfast  ? calcitRIOL  0.5 mcg Oral Daily  ? enoxaparin (LOVENOX) injection  1 mg/kg Subcutaneous Daily  ? loratadine  10 mg Oral Daily  ? mouth rinse  15 mL Mouth Rinse BID  ? mexiletine  200 mg Oral TID  ? pantoprazole  40 mg Oral Daily  ? potassium chloride SA  20 mEq Oral BID  ? tamsulosin  0.4 mg Oral Daily  ? Warfarin - Pharmacist Dosing Inpatient   Does not apply F8182  ? ?Continuous Infusions: ? penicillin g continuous IV infusion 6 Million Units (03/29/22 2321)  ?  ?Diet Order   ? ?       ?  DIET DYS 3 Room service appropriate? Yes; Fluid consistency: Thin  Diet effective now       ?  ? ?  ?  ? ?   ? ?Unresulted Labs (From admission, onward)  ? ?  Start     Ordered  ? 04/01/22 0500  CBC  Every 72 hours,   R     ?Question:  Specimen collection method  Answer:  Lab=Lab collect  ? 03/29/22 1006  ? 03/30/22

## 2022-03-31 DIAGNOSIS — R6520 Severe sepsis without septic shock: Secondary | ICD-10-CM | POA: Diagnosis not present

## 2022-03-31 DIAGNOSIS — A409 Streptococcal sepsis, unspecified: Secondary | ICD-10-CM | POA: Diagnosis not present

## 2022-03-31 DIAGNOSIS — J9601 Acute respiratory failure with hypoxia: Secondary | ICD-10-CM | POA: Diagnosis not present

## 2022-03-31 LAB — PROTIME-INR
INR: 3.6 — ABNORMAL HIGH (ref 0.8–1.2)
Prothrombin Time: 35.4 seconds — ABNORMAL HIGH (ref 11.4–15.2)

## 2022-03-31 LAB — CBC
HCT: 35.3 % — ABNORMAL LOW (ref 39.0–52.0)
Hemoglobin: 11.9 g/dL — ABNORMAL LOW (ref 13.0–17.0)
MCH: 33.6 pg (ref 26.0–34.0)
MCHC: 33.7 g/dL (ref 30.0–36.0)
MCV: 99.7 fL (ref 80.0–100.0)
Platelets: 174 10*3/uL (ref 150–400)
RBC: 3.54 MIL/uL — ABNORMAL LOW (ref 4.22–5.81)
RDW: 14.4 % (ref 11.5–15.5)
WBC: 6.9 10*3/uL (ref 4.0–10.5)
nRBC: 0 % (ref 0.0–0.2)

## 2022-03-31 LAB — BASIC METABOLIC PANEL
Anion gap: 8 (ref 5–15)
BUN: 35 mg/dL — ABNORMAL HIGH (ref 8–23)
CO2: 27 mmol/L (ref 22–32)
Calcium: 9.1 mg/dL (ref 8.9–10.3)
Chloride: 102 mmol/L (ref 98–111)
Creatinine, Ser: 1.68 mg/dL — ABNORMAL HIGH (ref 0.61–1.24)
GFR, Estimated: 38 mL/min — ABNORMAL LOW (ref >60)
Glucose, Bld: 114 mg/dL — ABNORMAL HIGH (ref 70–99)
Potassium: 3.8 mmol/L (ref 3.5–5.1)
Sodium: 137 mmol/L (ref 135–145)

## 2022-03-31 LAB — URINALYSIS, ROUTINE W REFLEX MICROSCOPIC
Bacteria, UA: NONE SEEN
Bilirubin Urine: NEGATIVE
Glucose, UA: NEGATIVE mg/dL
Ketones, ur: NEGATIVE mg/dL
Leukocytes,Ua: NEGATIVE
Nitrite: NEGATIVE
Protein, ur: NEGATIVE mg/dL
Specific Gravity, Urine: 1.006 (ref 1.005–1.030)
pH: 5 (ref 5.0–8.0)

## 2022-03-31 MED ORDER — GUAIFENESIN 100 MG/5ML PO LIQD: 5.0000 mL | ORAL | Status: DC | PRN

## 2022-03-31 MED ORDER — WARFARIN SODIUM 2 MG PO TABS
2.0000 mg | ORAL_TABLET | Freq: Once | ORAL | Status: AC
  Administered 2022-03-31: 2 mg via ORAL
  Filled 2022-03-31: qty 1

## 2022-03-31 NOTE — Progress Notes (Signed)
?PROGRESS NOTE ?Steve Riddle.  HAL:[id number] DOB: 1929-08-25 DOA: 03/27/2022 ?PCP: [name], MD  ? ?Brief Narrative/Hospital Course: ?16 yoM. W/ Class I obesity, gout, secondary hyperparathyroidism,paroxysmal ventricular tachycardia on amiodarone and mexiletine, history of atrial flutter, history of CTI ablation April 2022 at [facility], history of systolic heart failure with an EF of 45%, most recently diagnosed with chronic heart failure with preserved EF, hyperlipidemia, hypertension,CAD, history of COVID myocarditis, history of mechanical heart valves, history of Enterococcus bacteremia treated empirically with vancomycin presented with generalized weakness progressively worse for couple of weeks, developed nausea and vomiting after eating grits for dinner followed by fever and more episodes of nausea vomiting, so came to the ED for evaluation where he was also short of breath placed on 2 L nasal cannula.   ?Son reports he had transient episodes of confusion earlier but at baseline in the ED. In the ED further work-up showed sepsis with fever of one 101.8, elevated creatinine 2.1 BNP 843, troponin  44> 67, lactic acidosis 2.0> 1.8, leukocytosis WBC 13.6, INR 4.6 UA with normal WBC BCID positive [place]-High grade strep gallolyticus bacteremia.  ID following, needs TEE ? ?Subjective: ?Seen and examined this morning.  Resting comfortably in the bedside chair, had some difficulty clearing mucus ?Overnight no fever, INR 3.6 ?Creatinine stable 1.6 ?Reports he wants to reconsider/talk about colonoscopy ? ?Assessment and Plan: ?Principal Problem: ?  Streptococcal sepsis, unspecified ([place]) ?Active Problems: ?  Hyperlipidemia ?  Hypertension ?  Class 1 obesity ?  Atrial fibrillation ([place]) ?  Chronic gouty arthritis ?  Chronic heart failure with preserved ejection fraction ([place]) ?  Coronary artery disease ?  Paroxysmal ventricular tachycardia ([place]) ?  S/P MVR (mitral valve replacement) ?  S/P AVR ?  Secondary  hyperparathyroidism ([place]) ?  Supratherapeutic INR ?  Elevated troponin ?  Acute respiratory failure with hypoxia ([place]) ?  Thrombocytopenia ([place]) ?  CKD (chronic kidney disease) stage 4, GFR 15-29 ml/min (HCC) ?  Streptococcal bacteremia ?  Acute bacterial endocarditis ?  ?High grade strep gallolyticus bacteremia/Sepsis r/o Endocarditis ?previous history of enterococcal endocarditis in 2021, and with 2 mechanical valves: ?Blood cx 4/4 positive.repeat blood cx 03/29/22 NGTD.cxr-no convincing pneumonia.   ?Patient with  high risk for endocarditis.planning for TEE-cardiology aware-ID on board continue with  Abx- changed from Rocephin > Pen g 03/29/22. Needs colonoscopy but patient and family reluctant for c-scope wife states  "he is not a candidate for colonoscopy"-informed Dr. Benson Norway from GI and he has signed off.  I again discussed 4/8 and explained and he does not want to have colonoscopy-at this morning he wants to discuss/reconsider colonoscopy- informed Dr Benson Norway- who will signout To [facility] ? ?Dysphagia/choking while eating:Esophagogram w/ no stricture,but some dysmotility,no need for EGD as per GI, speech following, on Dys 3 diet. ? ?CAD ?Hyperlipidemia ?Hypertension ?Mildly elevated troponin-demand ischemia: ?no chest pain.  BP noted to be hypotensive 4/723- Cut back bumex to 4> 2 mg.Troponin was SLIGHTLY + but flat-suspected demand ischemia.continue on his Coumadin. ? ?Chronic heart failure with preserved ejection fraction:compensated.  Continue home Bumex dose adjusted due to soft BP, monitor intake output Daily weight. Net IO Since Admission: -450.17 mL [03/31/22 1052]  ? ?Paroxysmal ventricular tachycardia  ?Paroxysmal Atrial fibrillation: ?S/P MVR ?S/P AVR: ?Previous history of endocarditis ?Supratherapeutic on admission 4.6 S/P vitamin K in the ED. ?Continue Coumadin pharmacy managing- needed lovenox x1 for inr < 2.5. ?Recent Labs  ?Lab 03/27/22 ?7353 03/28/22 ?0422 03/29/22 ?2992 03/30/22 ?4268 03/31/22 ?0409   ?  INR 4.6* 3.1* 2.2* 3.8* 3.6*  ?  ?Chronic gouty arthritis: Stable on home allopurinol ?Secondary hyperparathyroidism: Monitor calcium p.o. continue calcitriol ?Hypokalemia stable, continuing 20 KCl twice daily. ?Acute respiratory failure with hypoxia: On 2 L nasal cannula wean as tolerated ? ?Thrombocytopenia:from sepsis and resolved ?Recent Labs  ?Lab 03/27/22 ?1601 03/28/22 ?0422 03/29/22 ?0932 03/30/22 ?3557 03/31/22 ?0409  ?PLT 151 129* 139* 140* 174  ? ?CKD stage 4: Creatinine appears to be at baseline/improving.daily bmp. ?Recent Labs  ?Lab 03/27/22 ?3220 03/28/22 ?0422 03/29/22 ?2542 03/30/22 ?7062 03/31/22 ?0409  ?BUN 30* 33* 36* 36* 35*  ?CREATININE 2.11* 1.93* 1.93* 1.72* 1.68*  ? ?Class I Obesity:Patient's Body mass index is 32.61 kg/m?. : Will benefit with PCP follow-up, weight loss  healthy lifestyle and outpatient sleep evaluation. ? ?DVT prophylaxis: coumadin ?Code Status:   Code Status: Full Code ?Family Communication: plan of care discussed with patient  at bedside.  Patient's wife and son and daughter updated at the bedside previously. ?Patient status is: Inpatient level of care: Progressive  ?Remains inpatient because: Ongoing management of sepsis with IV antibiotics and further work-up ?Patient currently not stable ? ?Dispo: The patient is from: Home ?           Anticipated disposition: TBD ? ?Mobility Assessment (last 72 hours)   ? ? Mobility Assessment   ? ? [place] Name 03/31/22 0800 03/30/22 2108 03/30/22 1007 03/30/22 0939 03/29/22 2045  ? Does patient have an order for bedrest or is patient medically unstable No - Continue assessment No - Continue assessment No - Continue assessment No - Continue assessment No - Continue assessment  ? What is the highest level of mobility based on the progressive mobility assessment? Level 5 (Walks with assist in room/hall) - Balance while stepping forward/back and can walk in room with assist - Complete Level 5 (Walks with assist in room/hall) - Balance  while stepping forward/back and can walk in room with assist - Complete Level 5 (Walks with assist in room/hall) - Balance while stepping forward/back and can walk in room with assist - Complete Level 5 (Walks with assist in room/hall) - Balance while stepping forward/back and can walk in room with assist - Complete Level 5 (Walks with assist in room/hall) - Balance while stepping forward/back and can walk in room with assist - Complete  ? Is the above level different from baseline mobility prior to current illness? Yes - Recommend PT order Yes - Recommend PT order Yes - Recommend PT order Yes - Recommend PT order Yes - Recommend PT order  ? ? [place] Name 03/29/22 1101 03/29/22 0752  ?  ?  ?  ? Does patient have an order for bedrest or is patient medically unstable -- No - Continue assessment     ? What is the highest level of mobility based on the progressive mobility assessment? Level 5 (Walks with assist in room/hall) - Balance while stepping forward/back and can walk in room with assist - Complete Level 3 (Stands with assist) - Balance while standing  and cannot march in place     ? Is the above level different from baseline mobility prior to current illness? -- Yes - Recommend PT order     ? ?  ?  ? ?  ? ?Objective: ?Vitals last 24 hrs: ?Vitals:  ? 03/30/22 0544 03/30/22 1306 03/30/22 1920 03/31/22 0516  ?BP: 100/63 103/71 104/66 108/66  ?Pulse: 97 (!) 105 (!) 105 (!) 102  ?Resp: '20 20 20 20  '$ ?  Temp: 98.4 ?F (36.9 ?C) 97.7 ?F (36.5 ?C) 98.7 ?F (37.1 ?C) 98 ?F (36.7 ?C)  ?TempSrc: Oral Oral Oral Oral  ?SpO2: 91% 97% 95% 95%  ?Weight:      ?Height:      ? ?Weight change:  ? ?Physical Examination: ?General exam: AAOX3,older than stated age, weak appearing. ?HEENT:Oral mucosa moist, Ear/Nose WNL grossly, dentition normal. ?Respiratory system: bilaterally diminished, no use of accessory muscle ?Cardiovascular system: S1 & S2 +, No JVD,. ?Gastrointestinal system: Abdomen soft,NT,ND,BS+ ?Nervous System:Alert, awake,  moving extremities and grossly nonfocal ?Extremities: LE ankle edema none, distal peripheral pulses palpable.  ?Skin: No rashes,no icterus. ?MSK: Normal muscle bulk,tone, power  ? ? ?Medications reviewed:  ?Scheduled

## 2022-03-31 NOTE — Progress Notes (Signed)
ANTICOAGULATION CONSULT NOTE - Follow Up Consult ? ?Pharmacy Consult for Warfarin ?Indication: mechanical MVR/AVR, atrial flutter  ? ?Allergies  ?Allergen Reactions  ? Bee Venom Anaphylaxis  ? Ace Inhibitors Cough  ? Atorvastatin Other (See Comments)  ?  Muscle Pain  ? Pravastatin Other (See Comments)  ?  Muscle Pain  ? Ranexa [Ranolazine] Other (See Comments)  ?  Tachycardia  ? ? ?Patient Measurements: ?Height: '5\' 4"'$  (162.6 cm) ?Weight: 86.2 kg (190 lb) ?IBW/kg (Calculated) : 59.2 ?Heparin Dosing Weight:  ? ?Vital Signs: ?Temp: 98 ?F (36.7 ?C) (04/09 0516) ?Temp Source: Oral (04/09 0516) ?BP: 108/66 (04/09 0516) ?Pulse Rate: 102 (04/09 0516) ? ?Labs: ?Recent Labs  ?  03/29/22 ?0433 03/30/22 ?0865 03/31/22 ?0409  ?HGB 12.1* 11.7* 11.9*  ?HCT 35.6* 34.5* 35.3*  ?PLT 139* 140* 174  ?LABPROT 24.4* 37.1* 35.4*  ?INR 2.2* 3.8* 3.6*  ?CREATININE 1.93* 1.72* 1.68*  ? ? ? ?Estimated Creatinine Clearance: 28.4 mL/min (A) (by C-G formula based on SCr of 1.68 mg/dL (H)). ? ? ?Medications:  ?Scheduled:  ? allopurinol  400 mg Oral Daily  ? amiodarone  200 mg Oral Daily  ? bumetanide  2 mg Oral Q breakfast  ? calcitRIOL  0.5 mcg Oral Daily  ? loratadine  10 mg Oral Daily  ? mouth rinse  15 mL Mouth Rinse BID  ? mexiletine  200 mg Oral TID  ? pantoprazole  40 mg Oral Daily  ? potassium chloride SA  20 mEq Oral BID  ? tamsulosin  0.4 mg Oral Daily  ? Warfarin - Pharmacist Dosing Inpatient   Does not apply [id number]  ? ?Infusions:  ? penicillin g continuous IV infusion 6 Million Units (03/31/22 0054)  ? ? ?Assessment: ?58 y/oM with PMH of vtach, aflutter, MVR/AVR, gout, HTN, HLD, anemia, HF with EF 45%, CAD,  history of Enterococcus bacteremia who presented to [facility] ED with n/v, weakness, fever. INR on admission 4.6. Vitamin K 2.'5mg'$  PO was given per EDP on 4/5 at 1112. Pharmacy consulted for warfarin dosing this admission.  Barium swallow negative for stricture. Per discussion with Dr. Lupita Leash and Benson Norway, use Enoxaparin for bridging while INR  subtherapeutic.  ?  ?PTA warfarin regimen '3mg'$  daily except '6mg'$  on Fridays with goal INR 2.5-3.5 (regimen and goal range confirmed with PCP, Dr. Buel Ream office on 4/6) ?  ?Today, 03/31/2022:  ?INR 3.6, remains slightly supra-therapeutic ?CBC: Hgb low/stable at 11.9, Plt improved to 174k  ?No bleeding issues noted ?SCr 1.68, continues to decrease ?Drug interactions: Penicillin G can increase INR.  Amiodarone is chronic home med along with warfarin and likely doesn't contribute to acute changes.  Metronidazole x2 doses and azithromycin x1 dose (4/5-4/6) may have contributed to increased INR.  ? ? ?Goal of Therapy:  ?INR 2.5-3.5 ?Monitor platelets by anticoagulation protocol: Yes ?  ?Plan:  ?Warfarin 2 mg PO x 1 today at 16:00 ?Daily PT/INR. ?Monitor for signs and symptoms of bleeding. ? ? ?Gretta Arab PharmD, BCPS ?Clinical Pharmacist ?[facility] main [facility] [phone] ?03/31/2022 8:59 AM ? ?

## 2022-04-01 ENCOUNTER — Inpatient Hospital Stay (HOSPITAL_COMMUNITY): Payer: Medicare Other

## 2022-04-01 DIAGNOSIS — N184 Chronic kidney disease, stage 4 (severe): Secondary | ICD-10-CM

## 2022-04-01 DIAGNOSIS — Z86010 Personal history of colonic polyps: Secondary | ICD-10-CM | POA: Diagnosis not present

## 2022-04-01 DIAGNOSIS — A491 Streptococcal infection, unspecified site: Secondary | ICD-10-CM | POA: Diagnosis present

## 2022-04-01 DIAGNOSIS — D696 Thrombocytopenia, unspecified: Secondary | ICD-10-CM

## 2022-04-01 DIAGNOSIS — Z952 Presence of prosthetic heart valve: Secondary | ICD-10-CM

## 2022-04-01 DIAGNOSIS — J9601 Acute respiratory failure with hypoxia: Secondary | ICD-10-CM | POA: Diagnosis not present

## 2022-04-01 DIAGNOSIS — I480 Paroxysmal atrial fibrillation: Secondary | ICD-10-CM

## 2022-04-01 DIAGNOSIS — A409 Streptococcal sepsis, unspecified: Secondary | ICD-10-CM | POA: Diagnosis not present

## 2022-04-01 DIAGNOSIS — R6520 Severe sepsis without septic shock: Secondary | ICD-10-CM | POA: Diagnosis not present

## 2022-04-01 DIAGNOSIS — R791 Abnormal coagulation profile: Secondary | ICD-10-CM

## 2022-04-01 LAB — CBC
HCT: 35 % — ABNORMAL LOW (ref 39.0–52.0)
Hemoglobin: 11.4 g/dL — ABNORMAL LOW (ref 13.0–17.0)
MCH: 33.1 pg (ref 26.0–34.0)
MCHC: 32.6 g/dL (ref 30.0–36.0)
MCV: 101.7 fL — ABNORMAL HIGH (ref 80.0–100.0)
Platelets: 183 10*3/uL (ref 150–400)
RBC: 3.44 MIL/uL — ABNORMAL LOW (ref 4.22–5.81)
RDW: 14.4 % (ref 11.5–15.5)
WBC: 8 10*3/uL (ref 4.0–10.5)
nRBC: 0 % (ref 0.0–0.2)

## 2022-04-01 LAB — PROTIME-INR
INR: 4.5 (ref 0.8–1.2)
Prothrombin Time: 42.5 seconds — ABNORMAL HIGH (ref 11.4–15.2)

## 2022-04-01 MED ORDER — IOHEXOL 300 MG/ML  SOLN
80.0000 mL | Freq: Once | INTRAMUSCULAR | Status: AC | PRN
  Administered 2022-04-01: 80 mL via INTRAVENOUS

## 2022-04-01 MED ORDER — IOHEXOL 9 MG/ML PO SOLN
500.0000 mL | ORAL | Status: AC
  Administered 2022-04-01 (×2): 500 mL via ORAL

## 2022-04-01 MED ORDER — SODIUM CHLORIDE 0.9 % IV SOLN: INTRAVENOUS | Status: DC

## 2022-04-01 MED ORDER — SODIUM CHLORIDE (PF) 0.9 % IJ SOLN
INTRAMUSCULAR | Status: AC
  Filled 2022-04-01: qty 50

## 2022-04-01 NOTE — Progress Notes (Signed)
Physical Therapy Treatment ?Patient Details ?Name: Steve Riddle. ?MRN: [id number] ?DOB: Jun 11, 1929 ?Today's Date: 04/01/2022 ? ? ?History of Present Illness Pt is 88 y.o. male who presented to the emergency department on 03/27/22 with complaints of progressively worse generalized weakness for the past couple of weeks. Dx of sepsis due to undetermined organism, respiratory failure with hypoxia. Pt with medical history significant of class I obesity, gout, secondary hyperparathyroidism, paroxysmal ventricular tachycardia on amiodarone and mexiletine, history of atrial flutter, history of CTI ablation April 2022 at [facility], history of systolic heart failure with an EF of 45%, most recently diagnosed with chronic heart failure with preserved EF, hyperlipidemia, hypertension,CAD, history of COVID myocarditis, history of mechanical heart valves, history of Enterococcus bacteremia treated empirically with vancomycin ? ?  ?PT Comments  ? ? Pt making good progress.  O2 sats stable on RA and pt able to increase gait.  Only needed min cues.  Continue plan of care.   ?Recommendations for follow up therapy are one component of a multi-disciplinary discharge planning process, led by the attending physician.  Recommendations may be updated based on patient status, additional functional criteria and insurance authorization. ? ?Follow Up Recommendations ? No PT follow up ?  ?  ?Assistance Recommended at Discharge Set up Supervision/Assistance  ?Patient can return home with the following A little help with bathing/dressing/bathroom ?  ?Equipment Recommendations ? None recommended by PT  ?  ?Recommendations for Other Services   ? ? ?  ?Precautions / Restrictions Precautions ?Precaution Comments: monitor O2  ?  ? ?Mobility ? Bed Mobility ?  ?  ?  ?  ?  ?  ?  ?General bed mobility comments: in chair ?  ? ?Transfers ?Overall transfer level: Needs assistance ?Equipment used: Rolling walker (2 wheels) ?Transfers: Sit to/from Stand ?Sit to Stand:  Supervision ?  ?  ?  ?  ?  ?General transfer comment: VCs hand placement ?  ? ?Ambulation/Gait ?Ambulation/Gait assistance: Min guard ?Gait Distance (Feet): 200 Feet ?Assistive device: Rolling walker (2 wheels) ?Gait Pattern/deviations: Step-through pattern, Decreased stride length ?Gait velocity: WFL ?  ?  ?General Gait Details: Min cues for RW proximity; VSS on RA ? ? ?Stairs ?  ?  ?  ?  ?  ? ? ?Wheelchair Mobility ?  ? ?Modified Rankin (Stroke Patients Only) ?  ? ? ?  ?Balance Overall balance assessment: Needs assistance ?  ?Sitting balance-Leahy Scale: Good ?  ?  ?Standing balance support: Bilateral upper extremity supported, No upper extremity supported ?Standing balance-Leahy Scale: Fair ?Standing balance comment: RW to ambulate but could static stand no AD ?  ?  ?  ?  ?  ?  ?  ?  ?  ?  ?  ?  ? ?  ?Cognition Arousal/Alertness: Awake/alert ?Behavior During Therapy: [facility] for tasks assessed/performed ?Overall Cognitive Status: Within Functional Limits for tasks assessed ?  ?  ?  ?  ?  ?  ?  ?  ?  ?  ?  ?  ?  ?  ?  ?  ?  ?  ?  ? ?  ?Exercises Other Exercises ?Other Exercises: Held resistive exercise due to elevated INR ? ?  ?General Comments   ?  ?  ? ?Pertinent Vitals/Pain Pain Assessment ?Pain Assessment: No/denies pain  ? ? ?Home Living   ?  ?  ?  ?  ?  ?  ?  ?  ?  ?   ?  ?Prior Function    ?  ?  ?   ? ?  PT Goals (current goals can now be found in the care plan section) Progress towards PT goals: Progressing toward goals ? ?  ?Frequency ? ? ? Min 3X/week ? ? ? ?  ?PT Plan Current plan remains appropriate  ? ? ?Co-evaluation   ?  ?  ?  ?  ? ?  ?AM-PAC PT "6 Clicks" Mobility   ?Outcome Measure ? Help needed turning from your back to your side while in a flat bed without using bedrails?: None ?Help needed moving from lying on your back to sitting on the side of a flat bed without using bedrails?: None ?Help needed moving to and from a bed to a chair (including a wheelchair)?: A Little ?Help needed standing up from a  chair using your arms (e.g., wheelchair or bedside chair)?: A Little ?Help needed to walk in hospital room?: A Little ?Help needed climbing 3-5 steps with a railing? : A Little ?6 Click Score: 20 ? ?  ?End of Session Equipment Utilized During Treatment: Gait belt ?Activity Tolerance: Patient tolerated treatment well ?Patient left: in chair;with call bell/phone within reach;with family/visitor present;with chair alarm set ?Nurse Communication: Mobility status ?PT Visit Diagnosis: Difficulty in walking, not elsewhere classified (R26.2) ?  ? ? ?Time: 0350-0938 ?PT Time Calculation (min) (ACUTE ONLY): 20 min ? ?Charges:  $Gait Training: 8-22 mins          ?          ? ?Abran Richard, PT ?Acute Rehab Services ?Pager [phone] ?[facility] [phone] ? ? ? ?Steve Riddle Steve Riddle ?04/01/2022, 12:07 PM ? ?

## 2022-04-01 NOTE — Progress Notes (Addendum)
ANTICOAGULATION CONSULT NOTE - Follow Up Consult ? ?Pharmacy Consult for Warfarin ?Indication: Mechanical MVR/AVR, atrial flutter  ? ?Allergies  ?Allergen Reactions  ? Bee Venom Anaphylaxis  ? Ace Inhibitors Cough  ? Atorvastatin Other (See Comments)  ?  Muscle Pain  ? Pravastatin Other (See Comments)  ?  Muscle Pain  ? Ranexa [Ranolazine] Other (See Comments)  ?  Tachycardia  ? ? ?Patient Measurements: ?Height: '5\' 4"'$  (162.6 cm) ?Weight: 86.2 kg (190 lb) ?IBW/kg (Calculated) : 59.2 ? ?Vital Signs: ?Temp: 98.2 ?F (36.8 ?C) (04/10 0340) ?Temp Source: Oral (04/10 0340) ?BP: 107/72 (04/10 0340) ?Pulse Rate: 91 (04/10 0340) ? ?Labs: ?Recent Labs  ?  03/30/22 ?4782 03/31/22 ?0409 04/01/22 ?0430  ?HGB 11.7* 11.9* 11.4*  ?HCT 34.5* 35.3* 35.0*  ?PLT 140* 174 183  ?LABPROT 37.1* 35.4* 42.5*  ?INR 3.8* 3.6* 4.5*  ?CREATININE 1.72* 1.68*  --   ? ? ?Estimated Creatinine Clearance: 28.4 mL/min (A) (by C-G formula based on SCr of 1.68 mg/dL (H)). ? ? ?Medications:  ?Scheduled:  ? allopurinol  400 mg Oral Daily  ? amiodarone  200 mg Oral Daily  ? bumetanide  2 mg Oral Q breakfast  ? calcitRIOL  0.5 mcg Oral Daily  ? loratadine  10 mg Oral Daily  ? mouth rinse  15 mL Mouth Rinse BID  ? mexiletine  200 mg Oral TID  ? pantoprazole  40 mg Oral Daily  ? potassium chloride SA  20 mEq Oral BID  ? tamsulosin  0.4 mg Oral Daily  ? Warfarin - Pharmacist Dosing Inpatient   Does not apply N5621  ? ?Infusions:  ? penicillin g continuous IV infusion 6 Million Units (04/01/22 0107)  ? ? ?Assessment: ?Patient is a 88yo male with PMH of vtach, atrial flutter, HTN, anemia, HF with EF 45%, CAD, MVR/AVR and hx of Enterococcus bacteremia who presented to ED on 4/5 with complaints of generalized weakness, n/v, and fever.  ?INR 4.6 on admission, given 2.'5mg'$  Vitamin K PO on 4/5 1112. Barium swallow negative for stricture. Pharmacy consulted for warfarin dosing. Patient given 1 dose [place] Enoxaparin for bridging while INR subtherapeutic on 4/7.  ? ?PTA  warfarin regimen '3mg'$  daily except '6mg'$  on Fridays (regimen confirmed with PCP, Dr. Buel Ream office on 4/6) ? ?Today, 04/01/2022: ?-INR 4.5, supra-therapeutic ?-CBC: Hgb low/stable (11.4), platelets WNL (183) ?-no bleeding issues reported  ?-Drug interactions: Penicillin G can increase INR. Amiodarone is home med with warfarin likely not contributing to changes.  ?-100% PO meals charted   ? ?Goal of Therapy:  ?INR 2.5 - 3.5 ?Monitor platelets by anticoagulation protocol: Yes ?  ?Plan:  ?-Hold warfarin dose today  ?-Daily PT/INR ?-CBC monitoring at least q72h ?-Monitor for s/sx bleeding ? ?Trace Pigg, Student-PharmD  ?04/01/2022,9:11 AM ? ? ?I discussed / reviewed the pharmacy note by Trace Pigg, Student-PharmD, and I agree with his findings and plans as documented. ? ? ?Lindell Spar, PharmD, BCPS ?Clinical Pharmacist  ?04/01/2022 2:51 PM ? ? ?

## 2022-04-01 NOTE — Progress Notes (Addendum)
Patient ID: Steve Riddle., male   DOB: [birth year], 88 y.o.   MRN: [id number] ? ? ? Progress Note ? ? Subjective  ?Day # 5 ? ?Asked to reevaluate patient today regarding possible colonoscopy in setting of strep gallolyticus bacteremia and mechanical AVR and MVR on chronic Coumadin ? ?Echo-no obvious vegetation ?TTE recommended ? ?Labs today WBC 8.0 hemoglobin 11.4/hematocrit 35.0 ?Pro time 42/INR 4.5 ? ?Barium swallow-done due to complaints of regurgitation-no gross stricture, suggestion of mild dysmotility, small hiatal hernia ? ?Last colon 2007/[facility]-4 polyps removed. ? ?Patient up in chair, wife at bedside, patient says he is feeling okay, no GI complaints. ?Anticipating TEE today. ? ? ? ? ? Objective  ? ?Vital signs in last 24 hours: ?Temp:  [97.9 ?F (36.6 ?C)-98.7 ?F (37.1 ?C)] 98.2 ?F (36.8 ?C) (04/10 0340) ?Pulse Rate:  [91-110] 91 (04/10 0340) ?Resp:  [18-20] 20 (04/10 0340) ?BP: (107-122)/(72-82) 107/72 (04/10 0340) ?SpO2:  [93 %-96 %] 93 % (04/10 0340) ?Last BM Date : 03/31/22 ?General: [name] in NAD ?Heart:  Regular rate and rhythm; no murmurs mechanical valve clicks ?Lungs: Respirations even and unlabored, lungs CTA bilaterally ?Abdomen:  Soft, nontender and nondistended. Normal bowel sounds. ?Extremities:  Without edema. ?Neurologic:  Alert and oriented,  grossly normal neurologically. ?Psych:  Cooperative. Normal mood and affect. ? ?Intake/Output from previous day: ?04/09 0701 - 04/10 0700 ?In: 991.1 [IV Piggyback:991.1] ?Out: 725 [Urine:725] ?Intake/Output this shift: ?Total I/O ?In: 0  ?Out: 250 [Urine:250] ? ?Lab Results: ?Recent Labs  ?  03/30/22 ?6808 03/31/22 ?0409 04/01/22 ?0430  ?WBC 7.0 6.9 8.0  ?HGB 11.7* 11.9* 11.4*  ?HCT 34.5* 35.3* 35.0*  ?PLT 140* 174 183  ? ?BMET ?Recent Labs  ?  03/30/22 ?8110 03/31/22 ?0409  ?NA 134* 137  ?K 3.4* 3.8  ?CL 102 102  ?CO2 27 27  ?GLUCOSE 112* 114*  ?BUN 36* 35*  ?CREATININE 1.72* 1.68*  ?CALCIUM 8.6* 9.1  ? ?LFT ?No results for input(s):  PROT, ALBUMIN, AST, ALT, ALKPHOS, BILITOT, BILIDIR, IBILI in the last 72 hours. ?PT/INR ?Recent Labs  ?  03/31/22 ?0409 04/01/22 ?0430  ?LABPROT 35.4* 42.5*  ?INR 3.6* 4.5*  ? ? ?Studies/Results: ?No results found. ? ? ? ? Assessment / Plan:   ? ?#76 88 year old white male admitted with transient episodes of confusion, then fever to 101.8, in setting of prior mechanical aortic valve replacement and mitral valve replacement, on chronic Coumadin ?Work-up in the ER assistant with sepsis ?Blood cultures positive for high-grade strep gallolyticus. ? ?ID is following,, echo showed no definite vegetation, have recommended TEE, cardiology is seeing today. ? ?ID has also recommended consideration of colonoscopy given association with intestinal neoplasm. ?Last colonoscopy 2007 with removal of 4 polyps. ? ?#2 history of atrial fibrillation, paroxysmal V. Tach ?#3 prior history of endocarditis June 2021 ?#4 supratherapeutic INR ?#5 congestive heart failure-EF 45-50 ?#6 mild dysphagia-barium swallow shows no evidence of stricture or mass, for mild dysmotility-okay for TEE ? ?Plan; given advanced age and multiple comorbidities, patient is high risk for complications with colonoscopy and sedation. ?We would need to hold Coumadin, and bridge with Lovenox or heparin for colonoscopy. ?I do not think he would be a surgical candidate should a colonic lesion be found.  Patient and wife concur that he has decided that he will not have any surgeries, at his advanced age. ?I would favor proceeding with CT of the abdomen and pelvis with contrast and if no obvious neoplasm visualized, not pursuing further  GI work-up-Will discuss with Steve Riddle. ? ? ? ? ?Principal Problem: ?  Streptococcal sepsis, unspecified ([place]) ?Active Problems: ?  Hyperlipidemia ?  Hypertension ?  Class 1 obesity ?  Atrial fibrillation ([place]) ?  Chronic gouty arthritis ?  Chronic heart failure with preserved ejection fraction ([place]) ?  Coronary artery disease ?  Paroxysmal  ventricular tachycardia ([place]) ?  S/P MVR (mitral valve replacement) ?  S/P AVR ?  Secondary hyperparathyroidism ([place]) ?  Supratherapeutic INR ?  Elevated troponin ?  Acute respiratory failure with hypoxia ([place]) ?  Thrombocytopenia ([place]) ?  CKD (chronic kidney disease) stage 4, GFR 15-29 ml/min (H[facility]) ?  Streptococcal bacteremia ?  Acute bacterial endocarditis ? ? ? ? LOS: 5 days  ? ?Steve Dikes PA-C 04/01/2022, 9:03 AM ? ?GI ATTENDING ? ?Interval history data reviewed.  Patient seen and examined.  Agree with interval progress note.  Agree consulted for consideration of colonoscopy in the patient with Streptococcus gallolyticus bacteremia.  I agree that there is an association between colorectal neoplasia and this organism.  He has had prior colonoscopy, though remotely.  The issue is a bit mood, as he is a poor surgical candidate and not interested in surgery (should colon cancer be identified).  I do think for prognostic purposes and more available information, that a CT scan of the abdomen to rule out obvious mass lesion would provide some meaningful additional information.  We will proceed.  We will follow. ? ?Steve Riddle. Geri [place]in[place]., M.D. ?[place] ?Division of Gastroenterology  ?  ?

## 2022-04-01 NOTE — Plan of Care (Signed)
?  Problem: Health Behavior/Discharge Planning: ?Goal: Ability to manage health-related needs will improve ?Outcome: Progressing ?  ?Problem: Clinical Measurements: ?Goal: Ability to maintain clinical measurements within normal limits will improve ?Outcome: Progressing ?Goal: Will remain free from infection ?Outcome: Progressing ?Goal: Diagnostic test results will improve ?Outcome: Progressing ?Goal: Respiratory complications will improve ?Outcome: Progressing ?Goal: Cardiovascular complication will be avoided ?Outcome: Progressing ?  ?Problem: Activity: ?Goal: Risk for activity intolerance will decrease ?Outcome: Progressing ?  ?Problem: Nutrition: ?Goal: Adequate nutrition will be maintained ?Outcome: Progressing ?  ?Problem: Coping: ?Goal: Level of anxiety will decrease ?Outcome: Progressing ?  ?Problem: Elimination: ?Goal: Will not experience complications related to bowel motility ?Outcome: Progressing ?Goal: Will not experience complications related to urinary retention ?Outcome: Progressing ?  ?Problem: Pain Managment: ?Goal: General experience of comfort will improve ?Outcome: Progressing ?  ?Problem: Safety: ?Goal: Ability to remain free from injury will improve ?Outcome: Progressing ?  ?Problem: Skin Integrity: ?Goal: Risk for impaired skin integrity will decrease ?Outcome: Progressing ?  ?Problem: Fluid Volume: ?Goal: Hemodynamic stability will improve ?Outcome: Progressing ?  ?Problem: Clinical Measurements: ?Goal: Diagnostic test results will improve ?Outcome: Progressing ?Goal: Signs and symptoms of infection will decrease ?Outcome: Progressing ?  ?Problem: Respiratory: ?Goal: Ability to maintain adequate ventilation will improve ?Outcome: Progressing ?  ?Problem: Fluid Volume: ?Goal: Hemodynamic stability will improve ?Outcome: Progressing ?  ?Problem: Clinical Measurements: ?Goal: Diagnostic test results will improve ?Outcome: Progressing ?Goal: Signs and symptoms of infection will decrease ?Outcome:  Progressing ?  ?Problem: Respiratory: ?Goal: Ability to maintain adequate ventilation will improve ?Outcome: Progressing ?  ?

## 2022-04-01 NOTE — Progress Notes (Signed)
? ? ? ? ? ? ?Subjective: ?No new complaints ? ? ?Antibiotics:  ?Anti-infectives (From admission, onward)  ? ? Start     Dose/Rate Route Frequency Ordered Stop  ? 03/29/22 1200  vancomycin (VANCOREADY) IVPB 1250 mg/250 mL  Status:  Discontinued       ? 1,250 mg ?166.7 mL/hr over 90 Minutes Intravenous Every 48 hours 03/27/22 1152 03/28/22 0935  ? 03/29/22 1100  penicillin G potassium 6 Million Units in dextrose 5 % 500 mL continuous infusion       ? 6 Million Units ?41.7 mL/hr over 12 Hours Intravenous Every 12 hours 03/29/22 0937    ? 03/28/22 1100  cefTRIAXone (ROCEPHIN) 1 g in sodium chloride 0.9 % 100 mL IVPB  Status:  Discontinued       ? 1 g ?200 mL/hr over 30 Minutes Intravenous Every 24 hours 03/27/22 1617 03/28/22 0935  ? 03/28/22 1100  cefTRIAXone (ROCEPHIN) 2 g in sodium chloride 0.9 % 100 mL IVPB  Status:  Discontinued       ? 2 g ?200 mL/hr over 30 Minutes Intravenous Every 24 hours 03/28/22 0935 03/29/22 0937  ? 03/27/22 2359  metroNIDAZOLE (FLAGYL) IVPB 500 mg  Status:  Discontinued       ? 500 mg ?100 mL/hr over 60 Minutes Intravenous Every 12 hours 03/27/22 1600 03/28/22 0935  ? 03/27/22 1300  vancomycin (VANCOCIN) 500 mg in sodium chloride 0.9 % 100 mL IVPB       ?See Hyperspace for full Linked Orders Report.  ? 500 mg ?100 mL/hr over 60 Minutes Intravenous  Once 03/27/22 1144 03/27/22 1314  ? 03/27/22 1145  metroNIDAZOLE (FLAGYL) IVPB 500 mg  Status:  Discontinued       ? 500 mg ?100 mL/hr over 60 Minutes Intravenous Every 12 hours 03/27/22 1137 03/27/22 1600  ? 03/27/22 1145  vancomycin (VANCOCIN) IVPB 1000 mg/200 mL premix       ?See Hyperspace for full Linked Orders Report.  ? 1,000 mg ?200 mL/hr over 60 Minutes Intravenous  Once 03/27/22 1144 03/27/22 1418  ? 03/27/22 1100  cefTRIAXone (ROCEPHIN) 1 g in sodium chloride 0.9 % 100 mL IVPB       ? 1 g ?200 mL/hr over 30 Minutes Intravenous  Once 03/27/22 1049 03/27/22 1143  ? 03/27/22 1100  azithromycin (ZITHROMAX) 500 mg in sodium chloride 0.9 %  250 mL IVPB  Status:  Discontinued       ? 500 mg ?250 mL/hr over 60 Minutes Intravenous  Once 03/27/22 1049 03/27/22 1137  ? ?  ? ? ?Medications: ?Scheduled Meds: ? allopurinol  400 mg Oral Daily  ? amiodarone  200 mg Oral Daily  ? bumetanide  2 mg Oral Q breakfast  ? calcitRIOL  0.5 mcg Oral Daily  ? loratadine  10 mg Oral Daily  ? mouth rinse  15 mL Mouth Rinse BID  ? mexiletine  200 mg Oral TID  ? pantoprazole  40 mg Oral Daily  ? potassium chloride SA  20 mEq Oral BID  ? tamsulosin  0.4 mg Oral Daily  ? Warfarin - Pharmacist Dosing Inpatient   Does not apply [id number]  ? ?Continuous Infusions: ? penicillin g continuous IV infusion 6 Million Units (04/01/22 1354)  ? ?PRN Meds:.acetaminophen **OR** acetaminophen, guaiFENesin, levalbuterol, ondansetron **OR** ondansetron (ZOFRAN) IV, simethicone ? ? ? ?Objective: ?Weight change:  ? ?Intake/Output Summary (Last 24 hours) at 04/01/2022 1543 ?Last data filed at 04/01/2022 1236 ?Gross per 24 hour  ?Intake 1351.07  ml  ?Output 775 ml  ?Net 576.07 ml  ? ?Blood pressure 100/63, pulse 95, temperature 98.7 ?F (37.1 ?C), temperature source Oral, resp. rate 20, height '5\' 4"'$  (1.626 m), weight 86.2 kg, SpO2 95 %. ?Temp:  [98.2 ?F (36.8 ?C)-98.7 ?F (37.1 ?C)] 98.7 ?F (37.1 ?C) (04/10 1230) ?Pulse Rate:  [91-107] 95 (04/10 1230) ?Resp:  [20] 20 (04/10 1230) ?BP: (100-117)/(63-82) 100/63 (04/10 1230) ?SpO2:  [93 %-95 %] 95 % (04/10 1230) ? ?Physical Exam: ?Physical Exam ?Constitutional:   ?   Appearance: He is well-developed.  ?HENT:  ?   Head: Normocephalic and atraumatic.  ?Eyes:  ?   Conjunctiva/sclera: Conjunctivae normal.  ?Cardiovascular:  ?   Rate and Rhythm: Normal rate and regular rhythm.  ?Pulmonary:  ?   Effort: Pulmonary effort is normal. No respiratory distress.  ?   Breath sounds: No wheezing.  ?Abdominal:  ?   General: There is no distension.  ?Musculoskeletal:     ?   General: Normal range of motion.  ?   Cervical back: Normal range of motion and neck supple.  ?Skin: ?    General: Skin is warm and dry.  ?   Findings: No erythema or rash.  ?Neurological:  ?   General: No focal deficit present.  ?   Mental Status: He is alert and oriented to person, place, and time.  ?Psychiatric:     ?   Mood and Affect: Mood normal.     ?   Behavior: Behavior normal.     ?   Thought Content: Thought content normal.     ?   Judgment: Judgment normal.  ?  ? ?CBC: ? ? ? ?BMET ?Recent Labs  ?  03/30/22 ?7124 03/31/22 ?0409  ?NA 134* 137  ?K 3.4* 3.8  ?CL 102 102  ?CO2 27 27  ?GLUCOSE 112* 114*  ?BUN 36* 35*  ?CREATININE 1.72* 1.68*  ?CALCIUM 8.6* 9.1  ? ? ? ?Liver Panel ? ?No results for input(s): PROT, ALBUMIN, AST, ALT, ALKPHOS, BILITOT, BILIDIR, IBILI in the last 72 hours. ? ? ? ? ?Sedimentation Rate ?No results for input(s): ESRSEDRATE in the last 72 hours. ?C-Reactive Protein ?No results for input(s): CRP in the last 72 hours. ? ?Micro Results: ?Recent Results (from the past 720 hour(s))  ?Resp Panel by RT-PCR (Flu A&B, Covid) Nasopharyngeal Swab     Status: None  ? Collection Time: 03/27/22  9:34 AM  ? Specimen: Nasopharyngeal Swab; Nasopharyngeal(NP) swabs in vial transport medium  ?Result Value Ref Range Status  ? SARS Coronavirus 2 by RT PCR NEGATIVE NEGATIVE Final  ?  Comment: (NOTE) ?SARS-CoV-2 target nucleic acids are NOT DETECTED. ? ?The SARS-CoV-2 RNA is generally detectable in upper respiratory ?specimens during the acute phase of infection. The lowest ?concentration of SARS-CoV-2 viral copies this assay can detect is ?138 copies/mL. A negative result does not preclude SARS-Cov-2 ?infection and should not be used as the sole basis for treatment or ?other patient management decisions. A negative result may occur with  ?improper specimen collection/handling, submission of specimen other ?than nasopharyngeal swab, presence of viral mutation(s) within the ?areas targeted by this assay, and inadequate number of viral ?copies(<138 copies/mL). A negative result must be combined with ?clinical  observations, patient history, and epidemiological ?information. The expected result is Negative. ? ?Fact Sheet for Patients:  ?EntrepreneurPulse.com.au ? ?Fact Sheet for Healthcare Providers:  ?IncredibleEmployment.be ? ?This test is no t yet approved or cleared by the [place] and  ?  has been authorized for detection and/or diagnosis of SARS-CoV-2 by ?FDA under an Emergency Use Authorization (EUA). This EUA will remain  ?in effect (meaning this test can be used) for the duration of the ?COVID-19 declaration under Section 564(b)(1) of the Act, 21 ?U.S.C.section 360bbb-3(b)(1), unless the authorization is terminated  ?or revoked sooner.  ? ? ?  ? Influenza A by PCR NEGATIVE NEGATIVE Final  ? Influenza B by PCR NEGATIVE NEGATIVE Final  ?  Comment: (NOTE) ?The Xpert Xpress SARS-CoV-2/FLU/RSV plus assay is intended as an aid ?in the diagnosis of influenza from Nasopharyngeal swab specimens and ?should not be used as a sole basis for treatment. Nasal washings and ?aspirates are unacceptable for Xpert Xpress SARS-CoV-2/FLU/RSV ?testing. ? ?Fact Sheet for Patients: ?EntrepreneurPulse.com.au ? ?Fact Sheet for Healthcare Providers: ?IncredibleEmployment.be ? ?This test is not yet approved or cleared by the [place] FDA and ?has been authorized for detection and/or diagnosis of SARS-CoV-2 by ?FDA under an Emergency Use Authorization (EUA). This EUA will remain ?in effect (meaning this test can be used) for the duration of the ?COVID-19 declaration under Section 564(b)(1) of the Act, 21 U.S.C. ?section 360bbb-3(b)(1), unless the authorization is terminated or ?revoked. ? ?Performed at [facility], [address] ?[place], [place] [address] ?  ?Blood Culture (routine x 2)     Status: Abnormal  ? Collection Time: 03/27/22  9:34 AM  ? Specimen: BLOOD  ?Result Value Ref Range Status  ? Specimen Description   Final  ?  BLOOD RIGHT  ANTECUBITAL ?Performed at [facility], [address] [place], [place] [address] ?  ? Special Requests   Final  ?  BOTTLES DRAWN AEROBIC AND ANAEROBIC Blood Culture adequate volume ?Performed a

## 2022-04-01 NOTE — Progress Notes (Signed)
?PROGRESS NOTE ?Steve Riddle.  KYH:[id number] DOB: 28-Nov-1929 DOA: 03/27/2022 ?PCP: [name], MD  ? ?Brief Narrative/Hospital Course: ?59 yoM. W/ Class I obesity, gout, secondary hyperparathyroidism,paroxysmal ventricular tachycardia on amiodarone and mexiletine, history of atrial flutter, history of CTI ablation April 2022 at [facility], history of systolic heart failure with an EF of 45%, most recently diagnosed with chronic heart failure with preserved EF, hyperlipidemia, hypertension,CAD, history of COVID myocarditis, history of mechanical heart valves, history of Enterococcus bacteremia treated empirically with vancomycin presented with generalized weakness progressively worse for couple of weeks, developed nausea and vomiting after eating grits for dinner followed by fever and more episodes of nausea vomiting, so came to the ED for evaluation where he was also short of breath placed on 2 L nasal cannula.   ?Son reports he had transient episodes of confusion earlier but at baseline in the ED. In the ED further work-up showed sepsis with fever of one 101.8, elevated creatinine 2.1 BNP 843, troponin  44> 67, lactic acidosis 2.0> 1.8, leukocytosis WBC 13.6, INR 4.6 UA with normal WBC BCID positive [place]-High grade strep gallolyticus bacteremia.  ID following, needs TEE ? ?Subjective: ?Seen examined this am ?Resting comfortably in bedside chair ?Wife at the bedside ?Overnight no fever.  Yesterday was complaining of dysuria UA was unremarkable ? ?Assessment and Plan: ?Principal Problem: ?  Streptococcal sepsis, unspecified ([place]) ?Active Problems: ?  Hyperlipidemia ?  Hypertension ?  Class 1 obesity ?  Atrial fibrillation ([place]) ?  Chronic gouty arthritis ?  Chronic heart failure with preserved ejection fraction ([place]) ?  Coronary artery disease ?  Paroxysmal ventricular tachycardia ([place]) ?  S/P MVR (mitral valve replacement) ?  S/P AVR ?  Secondary hyperparathyroidism ([place]) ?  Supratherapeutic INR ?  Elevated  troponin ?  Acute respiratory failure with hypoxia ([place]) ?  Thrombocytopenia ([place]) ?  CKD (chronic kidney disease) stage 4, GFR 15-29 ml/min (H[facility]) ?  Streptococcal bacteremia ?  Acute bacterial endocarditis ?  ?High grade strep gallolyticus bacteremia/Sepsis r/o Endocarditis ?previous history of enterococcal endocarditis in 2021, and with 2 mechanical valves: ?Blood cx 4/4 positive.repeat blood cx 03/29/22 negative.Patient with  high risk for endocarditis.planning for TEE-cardiology aware and seeing him.ID on board continue with antibiotics Rocephin changed > Pen G 03/29/22.  Per ID needs colonoscopy for source but patient and family reluctant but 4/9 he mentioned he wants to discuss with GI and GI has been consulted.  Patient wife mentioned that due to ?bleeding/being on coumadin "He is not a candidate for colonoscopy" ? ?Dysphagia/choking while eating:Esophagogram w/ no stricture,but some dysmotility,no need for EGD as per GI, speech following, on Dys 3 diet. ? ?CAD ?Hyperlipidemia ?Hypertension ?Mildly elevated troponin-demand ischemia: ?Stable, no chest pain and blood pressure ok, wasd hypotensive 4/723- Cut back bumex to 4> 2 mg.Troponin was SLIGHTLY + but flat-suspected demand ischemia.continue on his Coumadin. ? ?Chronic heart failure with preserved ejection fraction:compensated.  Continue home Bumex dose adjusted due to soft BP, monitor intake output Daily weight. Net IO Since Admission: -234.1 mL [04/01/22 1049]  ? ?Paroxysmal ventricular tachycardia  ?Paroxysmal Atrial fibrillation: ?S/P MVR ?S/P AVR: ?Previous history of endocarditis ?Supratherapeutic on admission 4.6 S/P vitamin K in the ED. ?Heart rate is stable.  No chest pain no shortness of breath no signs of CHF.  On Coumadin pharmacy managing, INR supratherapeutic. Needed lovenox x1 03/29/22 for inr < 2.5. ?Recent Labs  ?Lab 03/28/22 ?0422 03/29/22 ?0433 03/30/22 ?1517 03/31/22 ?0409 04/01/22 ?0430  ?INR 3.1* 2.2* 3.8*  3.6* 4.5*  ?  ?Chronic gouty  arthritis: No pain.  Continue allopurinol ?Secondary hyperparathyroidism: Monitor calcium p.o. continue calcitriol ?Hypokalemia stable, continuing 20 KCl twice daily. ?Acute respiratory failure with hypoxia: On 2 L nasal cannula wean as tolerated ? ?Thrombocytopenia:from sepsis .  Resolved  ?Recent Labs  ?Lab 03/28/22 ?0422 03/29/22 ?0433 03/30/22 ?8416 03/31/22 ?0409 04/01/22 ?0430  ?PLT 129* 139* 140* 174 183  ? ?CKD stage 4: Creatinine appears to be at baseline/improving.daily bmp. ?Recent Labs  ?Lab 03/27/22 ?6063 03/28/22 ?0422 03/29/22 ?0160 03/30/22 ?1093 03/31/22 ?0409  ?BUN 30* 33* 36* 36* 35*  ?CREATININE 2.11* 1.93* 1.93* 1.72* 1.68*  ? ?Class I Obesity:Patient's Body mass index is 32.61 kg/m?. : Will benefit with PCP follow-up, weight loss  healthy lifestyle and outpatient sleep evaluation. ? ?DVT prophylaxis: coumadin ?Code Status:   Code Status: Full Code ?Family Communication: plan of care discussed with patient  at bedside.  Patient's wife and son and daughter updated at the bedside previously. ?Patient status is: Inpatient level of care: Progressive  ?Remains inpatient because: Ongoing management of sepsis with IV antibiotics and further work-up ?Patient currently not stable ? ?Dispo: The patient is from: Home ?           Anticipated disposition: TBD ? ?Mobility Assessment (last 72 hours)   ? ? Mobility Assessment   ? ? [place] Name 03/31/22 2124 03/31/22 0800 03/30/22 2108 03/30/22 1007 03/30/22 0939  ? Does patient have an order for bedrest or is patient medically unstable No - Continue assessment No - Continue assessment No - Continue assessment No - Continue assessment No - Continue assessment  ? What is the highest level of mobility based on the progressive mobility assessment? Level 5 (Walks with assist in room/hall) - Balance while stepping forward/back and can walk in room with assist - Complete Level 5 (Walks with assist in room/hall) - Balance while stepping forward/back and can walk in room  with assist - Complete Level 5 (Walks with assist in room/hall) - Balance while stepping forward/back and can walk in room with assist - Complete Level 5 (Walks with assist in room/hall) - Balance while stepping forward/back and can walk in room with assist - Complete Level 5 (Walks with assist in room/hall) - Balance while stepping forward/back and can walk in room with assist - Complete  ? Is the above level different from baseline mobility prior to current illness? Yes - Recommend PT order Yes - Recommend PT order Yes - Recommend PT order Yes - Recommend PT order Yes - Recommend PT order  ? ? [place] Name 03/29/22 2045 03/29/22 1101  ?  ?  ?  ? Does patient have an order for bedrest or is patient medically unstable No - Continue assessment --     ? What is the highest level of mobility based on the progressive mobility assessment? Level 5 (Walks with assist in room/hall) - Balance while stepping forward/back and can walk in room with assist - Complete Level 5 (Walks with assist in room/hall) - Balance while stepping forward/back and can walk in room with assist - Complete     ? Is the above level different from baseline mobility prior to current illness? Yes - Recommend PT order --     ? ?  ?  ? ?  ? ?Objective: ?Vitals last 24 hrs: ?Vitals:  ? 03/31/22 0516 03/31/22 1319 03/31/22 1952 04/01/22 0340  ?BP: 108/66 122/77 117/82 107/72  ?Pulse: (!) 102 (!) 110 (!) 107 91  ?  Resp: '20 18 20 20  '$ ?Temp: 98 ?F (36.7 ?C) 97.9 ?F (36.6 ?C) 98.7 ?F (37.1 ?C) 98.2 ?F (36.8 ?C)  ?TempSrc: Oral Oral Oral Oral  ?SpO2: 95% 96% 94% 93%  ?Weight:      ?Height:      ? ?Weight change:  ? ?Physical Examination: ?General exam: AAox3,older than stated age, weak appearing. ?HEENT:Oral mucosa moist, Ear/Nose WNL grossly, dentition normal. ?Respiratory system: bilaterally clear,no use of accessory muscle ?Cardiovascular system: S1 & S2 +, No JVD,. ?Gastrointestinal system: Abdomen soft,NT,ND, BS+ ?Nervous System:Alert, awake, moving  extremities and grossly nonfocal ?Extremities: edema neg,distal peripheral pulses palpable.  ?Skin: No rashes,no icterus. ?MSK: Normal muscle bulk,tone, power ? ? ? ?Medications reviewed:  ?Scheduled Meds: ? allopurinol

## 2022-04-01 NOTE — Consult Note (Addendum)
?Cardiology Consultation:  ? ?Patient ID: Steve Riddle. ?MRN: [id number]; DOB: 15-Dec-1929 ? ?Admit date: 03/27/2022 ?Date of Consult: 04/01/2022 ? ?PCP:  [name], MD ?  ?[facility] Providers ?Cardiologist:  None  ([place])  ? ? ?Patient Profile:  ? ?Steve Riddle. is a 88 y.o. male with a history of atrial flutter, history of [facility]I ablation April 2022 at [facility], history of systolic heart failure with EF 45, hyperlipidemia, hypertension, CAD, history of COVID myocarditis, history of mechanical heart valves, history of enterococcus bacteremia in 6/21, gout, class I obesity, secondary hyperparathyroidism, paroxysmal ventricular tachycardia on amiodarone and mexiletine, who is being seen 04/01/2022 for the evaluation of TEE clearance at the request of Dr. Lupita Leash.  ? ?History of Present Illness:  ? ?Steve Riddle is a 88 year old male with above medical history who is followed by [facility]. Per chart review, patient had a mechanical St. Jude aortic valve and a mechanical St. Jude mitral valve placed in 2005 due to abiotropha endocarditis and is chronically on warfarin. Patient developed e.faecalis bacteremia in June 2021. TEE on May 26 2020 showed no evidence of vegetations, abscess, valvular regurgitation. Patient was treated empirically for mechanical valve endocarditis with a 6 week course of antibiotics.  ? ?He has a known history of CAD and his most recent LHC was completed on 07/20/2008. Showed 30% disease in proximal RCA, normal Lcx, 40% disease in mid LAD.  ? ?In 2012, patient developed atrial fibrillation after he had surgery to repair a right groin AV fistula. On warfarin. Required cardioversion in 04/2019 and 11/2019. Holder monitor from 03/2020 showed no VT, but patient presented to the hospital on 09/25/20 with symptomatic VT, thought to be related to his active COVID-19 infection. Stabilized on procainamide, amiodarone, and beta blocker. AT his follow up visit, patient was instructed to wear a holter monitor  for 2 weeks. Wore the monitor in 09/2020 that showed a constant heart rate with 2-1 atrial flutter, sinus bradycardia. Continued to have symptomatic atrial flutter, had a successful cardioversion in November 2021. However, patient was found to be back in atrial flutter in February 2022 and patient underwent catheter ablation in April 2022. Due to patient's bradycardia, considered a pacemaker but ultimately decided to avoid due to prior endocarditis and mechanical valves.  ? ?Patient has a known history of chronic HFrEF, echocardiogram on 08/23/20 showed EF 35%, moderate LVH, mild RV systolic dysfunction. Given his reduced EF and his history of VT, providers coonsidered ICD, but decided against it due to high risk of infection.  More recently, echocardiogram in 01/10/2021 showed EF 45-50%, moderate LVH, mild RV systolic dysfunction.  ? ?Patient presented to the ED on 03/27/22 complaining of nausea, vomiting, and weakness that started the night prior. Patient also had a fever with a max temp 101. Patient was admitted to the hospitalist service for treatment of suspected sepsis, acute respiratory failure with hypoxia. Infectious disease consulted, found patient to have high grade strep gallolyticus bacteremia. ID recommended TEE, but first needed GI clearance due to dysphagia. GI recommended barium swallow to identify any possible strictures. Study completed on 03/29/22 showed no gross stricture mass, suggestion of mild dysmotility, no gastroesophageal reflux.  ? ?On interview, patient reports that he still has a productive cough, but his breathing has improved and he is tolerating room air. Denies any chest pain or palpitations. Has some swelling in his ankles, about the same as he usually has.  ? ?Past Medical History:  ?Diagnosis Date  ?  Anemia   ? Arthritis   ? Diverticulosis   ? Endocarditis   ? GI bleed   ? Gout   ? Heart murmur   ? Hx of adenomatous colonic polyps   ? Hyperlipemia   ? Hyperparathyroidism ([place])   ?  Hypertension   ? Nephrosclerosis   ? ? ?Past Surgical History:  ?Procedure Laterality Date  ? AORTIC AND MITRAL VALVE REPLACEMENT    ? CATARACT EXTRACTION Left   ? with lens implant  ? CORONARY ARTERY BYPASS GRAFT    ? MITRAL VALVE REPLACEMENT    ? ORIF ARM    ?  ? ?Home Medications:  ?Prior to Admission medications   ?Medication Sig Start Date End Date Taking? Authorizing Provider  ?acetaminophen (TYLENOL) 500 MG tablet Take 500 mg by mouth See admin instructions. 2- ?3 times a day   Yes [provider]  ?allopurinol (ZYLOPRIM) 100 MG tablet Take 400 mg by mouth daily.   Yes [provider]  ?amiodarone (PACERONE) 200 MG tablet Take 200 mg by mouth daily.   Yes [provider]  ?bumetanide (BUMEX) 2 MG tablet Take 2 mg by mouth 2 (two) times daily.   Yes [provider]  ?calcitRIOL (ROCALTROL) 0.5 MCG capsule Take 0.5 mcg by mouth every other day. 08/10/12  Yes [provider]  ?calcium carbonate (TUMS - DOSED IN MG ELEMENTAL CALCIUM) 500 MG chewable tablet Chew 1-2 tablets by mouth as needed for indigestion or heartburn.   Yes [provider]  ?cetirizine (ZYRTEC) 10 MG tablet Take 10 mg by mouth at bedtime.   Yes [provider]  ?diclofenac Sodium (VOLTAREN) 1 % GEL Apply 2 g topically 4 (four) times daily as needed (to painful sites).   Yes [provider]  ?diphenhydrAMINE (BENADRYL) 25 MG tablet Take 25 mg by mouth at bedtime as needed (FOR BEE STING-RELATED ALLERGIC REACTIONS).   Yes [provider]  ?glucosamine-chondroitin 500-400 MG tablet Take 1 tablet by mouth daily with lunch.   Yes [provider]  ?mexiletine (MEXITIL) 200 MG capsule Take 200 mg by mouth in the morning, at noon, and at bedtime.   Yes [provider]  ?Multiple Vitamins-Minerals (CENTRUM SILVER 50+MEN) TABS Take 1 tablet by mouth daily with breakfast.   Yes [provider]  ?potassium chloride SA (K-DUR,KLOR-CON) 20 MEQ tablet  Take 20 mEq by mouth 2 (two) times daily with a meal.   Yes [provider]  ?ROLAIDS 550-110 MG CHEW Chew 1-2 tablets by mouth as needed (for heartburn).   Yes [provider]  ?simethicone (MYLICON) 782 MG chewable tablet Chew 125 mg by mouth every 6 (six) hours as needed for flatulence.   Yes [provider]  ?tamsulosin (FLOMAX) 0.4 MG CAPS capsule Take 0.8 mg by mouth at bedtime.   Yes [provider]  ?warfarin (COUMADIN) 3 MG tablet Take 3-6 mg by mouth See admin instructions. Take '3mg'$  daily except '6mg'$  on Fridays   Yes [provider]  ?omeprazole (PRILOSEC) 20 MG capsule Take 1 capsule (20 mg total) by mouth daily. ?Patient not taking: Reported on 03/27/2022 06/20/15   Lafayette Dragon, MD  ?pravastatin (PRAVACHOL) 40 MG tablet Take 80 mg by mouth daily.  ?Patient not taking: Reported on 03/27/2022    [provider]  ? ? ?Inpatient Medications: ?Scheduled Meds: ? allopurinol  400 mg Oral Daily  ? amiodarone  200 mg Oral Daily  ? bumetanide  2 mg Oral Q breakfast  ?  calcitRIOL  0.5 mcg Oral Daily  ? loratadine  10 mg Oral Daily  ? mouth rinse  15 mL Mouth Rinse BID  ? mexiletine  200 mg Oral TID  ? pantoprazole  40 mg Oral Daily  ? potassium chloride SA  20 mEq Oral BID  ? tamsulosin  0.4 mg Oral Daily  ? Warfarin - Pharmacist Dosing Inpatient   Does not apply S9702  ? ?Continuous Infusions: ? penicillin g continuous IV infusion 6 Million Units (04/01/22 0107)  ? ?PRN Meds: ?acetaminophen **OR** acetaminophen, guaiFENesin, levalbuterol, ondansetron **OR** ondansetron (ZOFRAN) IV, simethicone ? ?Allergies:    ?Allergies  ?Allergen Reactions  ? Bee Venom Anaphylaxis  ? Ace Inhibitors Cough  ? Atorvastatin Other (See Comments)  ?  Muscle Pain  ? Pravastatin Other (See Comments)  ?  Muscle Pain  ? Ranexa [Ranolazine] Other (See Comments)  ?  Tachycardia  ? ? ?Social History:   ?Social History  ? ?Socioeconomic History  ? Marital status: Married  ?  Spouse name: Not  on file  ? Number of children: Not on file  ? Years of education: Not on file  ? Highest education level: Not on file  ?Occupational History  ? Not on file  ?Tobacco Use  ? Smoking status: Former  ? Smoke

## 2022-04-01 NOTE — Care Management Important Message (Signed)
Important Message ? ?Patient Details IM Letter placed in Patients room. ?Name: Steve Riddle. ?MRN: [id number] ?Date of Birth: 1929/11/15 ? ? ?Medicare Important Message Given:  Yes ? ? ? ? ?Kerin Salen ?04/01/2022, 2:20 PM ?

## 2022-04-02 DIAGNOSIS — Z86010 Personal history of colonic polyps: Secondary | ICD-10-CM | POA: Diagnosis not present

## 2022-04-02 DIAGNOSIS — A409 Streptococcal sepsis, unspecified: Secondary | ICD-10-CM | POA: Diagnosis not present

## 2022-04-02 DIAGNOSIS — R6520 Severe sepsis without septic shock: Secondary | ICD-10-CM | POA: Diagnosis not present

## 2022-04-02 DIAGNOSIS — J9601 Acute respiratory failure with hypoxia: Secondary | ICD-10-CM | POA: Diagnosis not present

## 2022-04-02 DIAGNOSIS — A491 Streptococcal infection, unspecified site: Secondary | ICD-10-CM | POA: Diagnosis not present

## 2022-04-02 LAB — CBC
HCT: 40.1 % (ref 39.0–52.0)
Hemoglobin: 12.7 g/dL — ABNORMAL LOW (ref 13.0–17.0)
MCH: 32.9 pg (ref 26.0–34.0)
MCHC: 31.7 g/dL (ref 30.0–36.0)
MCV: 103.9 fL — ABNORMAL HIGH (ref 80.0–100.0)
Platelets: 212 10*3/uL (ref 150–400)
RBC: 3.86 MIL/uL — ABNORMAL LOW (ref 4.22–5.81)
RDW: 14.6 % (ref 11.5–15.5)
WBC: 9.9 10*3/uL (ref 4.0–10.5)
nRBC: 0 % (ref 0.0–0.2)

## 2022-04-02 LAB — PROTIME-INR
INR: 3.9 — ABNORMAL HIGH (ref 0.8–1.2)
Prothrombin Time: 37.9 seconds — ABNORMAL HIGH (ref 11.4–15.2)

## 2022-04-02 NOTE — Progress Notes (Signed)
?PROGRESS NOTE ?[name]  ZWC:[id number] DOB: 09/25/1929 DOA: 03/27/2022 ?PCP: [name], MD  ? ?Brief Narrative/Hospital Course: ?41 yoM. W/ Class I obesity, gout, secondary hyperparathyroidism,paroxysmal ventricular tachycardia on amiodarone and mexiletine, history of atrial flutter, history of CTI ablation April 2022 at [facility], history of systolic heart failure with an EF of 45%, most recently diagnosed with chronic heart failure with preserved EF, hyperlipidemia, hypertension,CAD, history of COVID myocarditis, history of mechanical heart valves, history of Enterococcus bacteremia treated empirically with vancomycin presented with generalized weakness progressively worse for couple of weeks, developed nausea and vomiting after eating grits for dinner followed by fever and more episodes of nausea vomiting, so came to the ED for evaluation where he was also short of breath placed on 2 L nasal cannula.   ?Son reports he had transient episodes of confusion earlier but at baseline in the ED. In the ED further work-up showed sepsis with fever of one 101.8, elevated creatinine 2.1 BNP 843, troponin  44> 67, lactic acidosis 2.0> 1.8, leukocytosis WBC 13.6, INR 4.6 UA with normal WBC BCID positive [place]-High grade strep gallolyticus bacteremia.  ID following, needs TEE ? ?Subjective: ?Seen and examined this morning.  Resting comfortably.  He has no complaints. ?Overnight afebrile, INR at 3.9 hemoglobin stable ?Doing well on room air.  Underwent CT abdomen per GI yesterday ? ?Assessment and Plan: ?Principal Problem: ?  Streptococcal sepsis, unspecified ([place]) ?Active Problems: ?  Hyperlipidemia ?  Hypertension ?  Class 1 obesity ?  Atrial fibrillation ([place]) ?  Chronic gouty arthritis ?  Chronic heart failure with preserved ejection fraction ([place]) ?  Coronary artery disease ?  History of colonic polyps ?  Paroxysmal ventricular tachycardia ([place]) ?  S/P MVR (mitral valve replacement) ?  S/P AVR ?  Secondary  hyperparathyroidism ([place]) ?  Supratherapeutic INR ?  Elevated troponin ?  Acute respiratory failure with hypoxia ([place]) ?  Thrombocytopenia ([place]) ?  CKD (chronic kidney disease) stage 4, GFR 15-29 ml/min (HCC) ?  Streptococcal bacteremia ?  Acute bacterial endocarditis ?  Infection due to Streptococcus gallolyticus ?  ?High grade strep gallolyticus bacteremia/Sepsis r/o Endocarditis ?previous history of enterococcal endocarditis in 2021, and with 2 mechanical valves: ?Blood cx 4/4 positive.repeat blood cx 03/29/22 negative.Patient with  high risk for endocarditis.cardiology evaluating for TEE. ID on board continue with antibiotics Rocephin changed > Pen G 03/29/22.  Per ID needs colonoscopy for source but patient and family reluctant but 4/9 he mentioned he wants to discuss with GI and GI has been consulted-and at this time deferring further invasive work-up and obtained CT abdomen pelvis see report for details- . ?" large 3.4 cm calcified gallstone in the gallbladder, no gastric wall thickening, no small bowel obstruction or inflammatory changes enteric contrast reaches the ascending colon equivocal short segment colonic wall thickening involving the splenic flexure, portions of the descending and sigmoid colon are not well assessed due to nondistention and barium within diverticuli-splenic flexure changes may be due to peristalsis however cannot rule out neoplasm" ? ?Dysphagia/choking while eating:Esophagogram w/ no stricture,but some dysmotility,no need for EGD as per GI, speech following, on Dys 3 diet.  Continue with aspiration precaution ? ?CAD ?Hyperlipidemia ?Hypertension ?Mildly elevated troponin-demand ischemia: ?BP stable stable, no chest pain. was hypotensive on 4/723- Cut back bumex 4>2 mg.Troponin was SLIGHTLY + but flat-suspected demand ischemia.continue on his Coumadin. ? ?Chronic heart failure with preserved ejection fraction: Volume is compensated.  Continue home Bumex dose adjusted due to soft BP,  monitor intake output Daily weight. Net IO Since Admission: -1,229.67 mL [04/02/22 1059]  ? ?Paroxysmal ventricular tachycardia  ?Paroxysmal Atrial fibrillation: ?S/P MVR ?S/P AVR: ?Previous history of endocarditis ?Supratherapeutic on admission 4.6 S/P vitamin K in the ED. ?Heart rate remains stable, she is a compensated.On Coumadin pharmacy managing-see INR trend below.Needed lovenox x1 03/29/22 for inr < 2.5. ?Recent Labs  ?Lab 03/29/22 ?0433 03/30/22 ?0488 03/31/22 ?0409 04/01/22 ?0430 04/02/22 ?8916  ?INR 2.2* 3.8* 3.6* 4.5* 3.9*  ?Chronic gouty arthritis: Stable.Continue allopurinol ?Secondary hyperparathyroidism: continue calcitriol ?Hypokalemia stable, continuing 20 KCl twice daily. ?Acute respiratory failure with hypoxia: On room air.   ?Thrombocytopenia:from sepsis .  Resolved  ?CKD stage 4: Creatinine appears to be at baseline/improving. Monitor bmp ?Recent Labs  ?Lab 03/27/22 ?9450 03/28/22 ?0422 03/29/22 ?3888 03/30/22 ?2800 03/31/22 ?0409  ?BUN 30* 33* 36* 36* 35*  ?CREATININE 2.11* 1.93* 1.93* 1.72* 1.68*  ?Class I Obesity:Patient's Body mass index is 32.61 kg/m?. : Will benefit with PCP follow-up, weight loss  healthy lifestyle and outpatient sleep evaluation. ? ?3.6 cm distal abdominal aortic aneurysm follow-up with cardiology recommendation of ?DVT prophylaxis: coumadin ?Code Status:   Code Status: Full Code ?Family Communication: plan of care discussed with patient  at bedside.  Patient's wife and son and daughter updated at the bedside previously. ?Patient status is: Inpatient level of care: Progressive  ?Remains inpatient because: Ongoing management of sepsis with IV antibiotics and further work-up-possible TEE, GI evaluation ?Patient currently not stable ? ?Dispo: The patient is from: Home ?           Anticipated disposition: TBD ? ?Mobility Assessment (last 72 hours)   ? ? Mobility Assessment   ? ? [place] Name 04/01/22 1945 04/01/22 1206 03/31/22 2124 03/31/22 0800 03/30/22 2108  ? Does patient  have an order for bedrest or is patient medically unstable No - Continue assessment -- No - Continue assessment No - Continue assessment No - Continue assessment  ? What is the highest level of mobility based on the progressive mobility assessment? Level 5 (Walks with assist in room/hall) - Balance while stepping forward/back and can walk in room with assist - Complete Level 5 (Walks with assist in room/hall) - Balance while stepping forward/back and can walk in room with assist - Complete Level 5 (Walks with assist in room/hall) - Balance while stepping forward/back and can walk in room with assist - Complete Level 5 (Walks with assist in room/hall) - Balance while stepping forward/back and can walk in room with assist - Complete Level 5 (Walks with assist in room/hall) - Balance while stepping forward/back and can walk in room with assist - Complete  ? Is the above level different from baseline mobility prior to current illness? Yes - Recommend PT order -- Yes - Recommend PT order Yes - Recommend PT order Yes - Recommend PT order  ? ?  ?  ? ?  ? ?Objective: ?Vitals last 24 hrs: ?Vitals:  ? 04/01/22 0340 04/01/22 1230 04/01/22 2057 04/02/22 0500  ?BP: 107/72 100/63 107/76 119/71  ?Pulse: 91 95 84 94  ?Resp: '20 20 18 20  '$ ?Temp: 98.2 ?F (36.8 ?C) 98.7 ?F (37.1 ?C) 98.4 ?F (36.9 ?C) 98.1 ?F (36.7 ?C)  ?TempSrc: Oral Oral Oral Oral  ?SpO2: 93% 95% 99% 98%  ?Weight:      ?Height:      ? ?Weight change:  ? ?Physical Examination: ?General exam: AA OX3, pleasant elderly. ?HEENT:Oral mucosa moist, Ear/Nose WNL grossly, dentition normal. ?Respiratory system:  bilaterally diminished, no use of accessory muscle ?Cardiovascular system: S1 & S2 +, No JVD. ?Gastrointestinal system: Abdomen soft,NT,ND,BS+ ?Nervous System:Alert, awake, moving extremities and grossly nonfocal ?Extremities: LE ankle edema none, distal peripheral pulses palpable.  ?Skin: No rashes,no icterus. ?MSK: Normal muscle bulk,tone, power  ? ?Medications  reviewed:  ?Scheduled Meds: ? allopurinol  400 mg Oral Daily  ? amiodarone  200 mg Oral Daily  ? bumetanide  2 mg Oral Q breakfast  ? calcitRIOL  0.5 mcg Oral Daily  ? loratadine  10 mg Oral Daily  ? mouth rinse  15 mL M

## 2022-04-02 NOTE — Progress Notes (Addendum)
Patient ID: Steve Mins., male   DOB: May 15, 1929, 88 y.o.   MRN: [id number] ? ? ? Progress Note ? ? Subjective  ? Day # 6 ? CC; weakness, nausea vomiting/fever-sepsis secondary to strep gallolyticus ?Patient with mechanical aortic and mitral valves-chronic Coumadin ? ?Penicillin G IV ? ?Echo this admission-EF down to 20 to 25%, severely dilated LA ?TEE-pending ? ?CT abdomen and pelvis yesterday-trace right pleural effusion, right infrahilar lymph node, large 3.4 cm calcified gallstone in the gallbladder, no gastric wall thickening, no small bowel obstruction or inflammatory changes enteric contrast reaches the ascending colon equivocal short segment colonic wall thickening involving the splenic flexure, portions of the descending and sigmoid colon are not well assessed due to nondistention and barium within diverticuli-splenic flexure changes may be due to peristalsis however cannot rule out neoplasm ?3.6 cm distal abdominal aortic aneurysm ? ?Pro time 37.9/INR 3.9 ?WBC 9.9/hemoglobin 12.7/hematocrit 40.1 ? ?Patient says he is feeling a bit stronger, no specific complaints today, no abdominal discomfort, component of chronic constipation. ?Wife at bedside ? ? ? ? Objective  ? ?Vital signs in last 24 hours: ?Temp:  [98.1 ?F (36.7 ?C)-98.7 ?F (37.1 ?C)] 98.1 ?F (36.7 ?C) (04/11 0500) ?Pulse Rate:  [84-95] 94 (04/11 0500) ?Resp:  [18-20] 20 (04/11 0500) ?BP: (100-119)/(63-76) 119/71 (04/11 0500) ?SpO2:  [95 %-99 %] 98 % (04/11 0500) ?Last BM Date : 04/02/22 ?General: Very elderly white male in NAD ?Heart:  irRegular rate and rhythm; no murmurs ?Lungs: Respirations even and unlabored, lungs CTA bilaterally ?Abdomen:  Soft, nontender and nondistended. Normal bowel sounds. ?Extremities:  Without edema. ?Neurologic:  Alert and oriented,  grossly normal neurologically. ?Psych:  Cooperative. Normal mood and affect. ? ?Intake/Output from previous day: ?04/10 0701 - 04/11 0700 ?In: 1754.4 [P.O.:600; I.V.:106.4; IV  Piggyback:1048.1] ?Out: 2600 [Urine:2600] ?Intake/Output this shift: ?No intake/output data recorded. ? ?Lab Results: ?Recent Labs  ?  03/31/22 ?0409 04/01/22 ?0430 04/02/22 ?4431  ?WBC 6.9 8.0 9.9  ?HGB 11.9* 11.4* 12.7*  ?HCT 35.3* 35.0* 40.1  ?PLT 174 183 212  ? ?BMET ?Recent Labs  ?  03/31/22 ?0409  ?NA 137  ?K 3.8  ?CL 102  ?CO2 27  ?GLUCOSE 114*  ?BUN 35*  ?CREATININE 1.68*  ?CALCIUM 9.1  ? ?LFT ?No results for input(s): PROT, ALBUMIN, AST, ALT, ALKPHOS, BILITOT, BILIDIR, IBILI in the last 72 hours. ?PT/INR ?Recent Labs  ?  04/01/22 ?0430 04/02/22 ?5400  ?LABPROT 42.5* 37.9*  ?INR 4.5* 3.9*  ? ? ?Studies/Results: ?CT ABDOMEN PELVIS W CONTRAST ? ?Result Date: 04/01/2022 ?CLINICAL DATA:  Sepsis strep gallatycus bacteremia- rule out colonic neoplasm EXAM: CT ABDOMEN AND PELVIS WITH CONTRAST TECHNIQUE: Multidetector CT imaging of the abdomen and pelvis was performed using the standard protocol following bolus administration of intravenous contrast. RADIATION DOSE REDUCTION: This exam was performed according to the departmental dose-optimization program which includes automated exposure control, adjustment of the mA and/or kV according to patient size and/or use of iterative reconstruction technique. CONTRAST:  50m OMNIPAQUE IOHEXOL 300 MG/ML  SOLN COMPARISON:  Abdominal ultrasound 04/13/2013 FINDINGS: Lower chest: Trace right pleural effusion and adjacent atelectasis. Mild cardiomegaly with coronary artery calcifications. Prominent right infrahilar lymph node at 12 mm partially included Hepatobiliary: No focal hepatic abnormality is seen. There is a 3.4 cm lamellated calcified gallstone in the gallbladder, adjacent high density may represent sludge or additional stones. No pericholecystic fat stranding or inflammation. Pancreas: No ductal dilatation or inflammation. Spleen: Normal in size without focal abnormality. Adrenals/Urinary Tract: Left adrenal  thickening without nodule. Normal right adrenal gland. There is  mild bilateral renal parenchymal thinning. No hydronephrosis. Homogeneous enhancement with symmetric excretion on delayed phase imaging. Distended but unremarkable urinary bladder. Stomach/Bowel: Mild gastric distension with ingested contents and air. There is no gastric wall thickening. No small bowel obstruction or inflammatory change. Administered enteric contrast reaches the ascending colon. The appendix is normal. Moderate volume of colonic stool. Equivocal short segment of colonic wall thickening involving the splenic flexure, series 2, image 30 and series 4, image 31. Portions of the descending and sigmoid colon are not well assessed due to nondistention as well as barium within diverticula from recent esophagram. Descending and sigmoid diverticulosis without focal diverticulitis. No colonic inflammation. Vascular/Lymphatic: Moderate aortic and branch atherosclerosis. Focal 3.6 cm aneurysm involving the distal aorta. Aortic branch vessels are patent. Portal, splenic, and superior mesenteric veins are patent. No enlarged lymph nodes in the abdomen or pelvis. No adenopathy in the region of possible colonic wall thickening. Reproductive: Prominent prostate gland spans 5.2 cm transverse. Other: No ascites or free air. Small fat containing umbilical hernia Musculoskeletal: Degenerative change throughout the lumbar spine. No focal bone lesion or acute osseous abnormalities IMPRESSION: 1. Equivocal short segment of colonic wall thickening involving the splenic flexure, which may be due to peristalsis, however colonic neoplasm is not excluded. Recommend up-to-date colonoscopy. 2. Cholelithiasis without CT findings of acute cholecystitis. 3. Colonic diverticulosis without focal diverticulitis. 4. Focal 3.6 cm distal abdominal aortic aneurysm. Recommend follow-up ultrasound every 2 years. This recommendation follows ACR consensus guidelines: White Paper of the ACR Incidental Findings Committee II on Vascular Findings.  J Am Coll Radiol 2013; 10:789-794. 5. Trace right pleural effusion and adjacent atelectasis. Aortic Atherosclerosis (ICD10-I70.0). Electronically Signed   By: Keith Rake M.D.   On: 04/01/2022 20:40   ? ? ? ? Assessment / Plan:   ? ?#43 88 year old white male with strep gallolyticus bacteremia/sepsis ? ?Continues on penicillin G ? ?Awaiting decision by cardiology regarding TEE ? ?CT of the abdomen and pelvis done yesterday, does not show any definite colonic neoplasm though they did mention an area of potential thickening at the splenic flexure.  Described as possibly secondary to peristalsis but could not rule out neoplasm, contrast had not filled that portion of the colon. ? ?#2 chronic anticoagulation-Coumadin with supratherapeutic INR ?#3 atrial fibrillation ?#4 congestive heart failure with new decreased EF ?#5 status post aortic valve and mitral valve replacement both mechanical ?#6 chronic kidney disease stage IV ? ?Plan; had further discussion with patient and wife today regarding CT findings.  CT inconclusive with questionable thickening at the splenic flexure versus peristalsis, no other colonic small bowel or gastric abnormalities noted. ?We discussed potential of colonoscopy noting increased risk with his multiple comorbidities, and also discussed further their desire for him not to undergo any surgery unless emergent. ?I do not think that colonoscopy will change our management, and even if a small colonic lesion were to be found patient is a very poor operative candidate and has decided that he will not have further surgeries. ?They are content with not having colonoscopy . ?GI will sign off, available for questions ? ? ? ? ?Principal Problem: ?  Streptococcal sepsis, unspecified ([place]) ?Active Problems: ?  Hyperlipidemia ?  Hypertension ?  Class 1 obesity ?  Atrial fibrillation ([place]) ?  Chronic gouty arthritis ?  Chronic heart failure with preserved ejection fraction ([place]) ?  Coronary artery  disease ?  History of colonic polyps ?  Paroxysmal  ventricular tachycardia ([place]) ?  S/P MVR (mitral valve replacement) ?  S/P AVR ?  Secondary hyperparathyroidism ([place]) ?  Supratherapeutic INR ?  Elevated troponin ?

## 2022-04-02 NOTE — Progress Notes (Addendum)
? ?Progress Note ? ?Patient Name: Steve Riddle. ?Date of Encounter: 04/02/2022 ? ?[facility] Cardiologist: [facility] ? ?Subjective  ? ?INR today 3.9. still in rate controlled afib/flutter. Patient denies chest pain or SOB.  ? ?Inpatient Medications  ?  ?Scheduled Meds: ? allopurinol  400 mg Oral Daily  ? amiodarone  200 mg Oral Daily  ? bumetanide  2 mg Oral Q breakfast  ? calcitRIOL  0.5 mcg Oral Daily  ? loratadine  10 mg Oral Daily  ? mouth rinse  15 mL Mouth Rinse BID  ? mexiletine  200 mg Oral TID  ? pantoprazole  40 mg Oral Daily  ? potassium chloride SA  20 mEq Oral BID  ? tamsulosin  0.4 mg Oral Daily  ? Warfarin - Pharmacist Dosing Inpatient   Does not apply G5003  ? ?Continuous Infusions: ? sodium chloride 20 mL/hr at 04/02/22 0035  ? penicillin g continuous IV infusion 6 Million Units (04/02/22 0203)  ? ?PRN Meds: ?acetaminophen **OR** acetaminophen, guaiFENesin, levalbuterol, ondansetron **OR** ondansetron (ZOFRAN) IV, simethicone  ? ?Vital Signs  ?  ?Vitals:  ? 04/01/22 0340 04/01/22 1230 04/01/22 2057 04/02/22 0500  ?BP: 107/72 100/63 107/76 119/71  ?Pulse: 91 95 84 94  ?Resp: '20 20 18 20  '$ ?Temp: 98.2 ?F (36.8 ?C) 98.7 ?F (37.1 ?C) 98.4 ?F (36.9 ?C) 98.1 ?F (36.7 ?C)  ?TempSrc: Oral Oral Oral Oral  ?SpO2: 93% 95% 99% 98%  ?Weight:      ?Height:      ? ? ?Intake/Output Summary (Last 24 hours) at 04/02/2022 1106 ?Last data filed at 04/02/2022 0908 ?Gross per 24 hour  ?Intake 1536.2 ml  ?Output 2750 ml  ?Net -1213.8 ml  ? ? ?  03/27/2022  ?  4:22 PM 03/27/2022  ?  9:19 AM 08/22/2020  ?  1:45 PM  ?Last 3 Weights  ?Weight (lbs) 190 lb 195 lb 1.7 oz 195 lb  ?Weight (kg) 86.183 kg 88.5 kg 88.451 kg  ?   ? ?Telemetry  ?  ?Afib/flutter, HR 80-90s - Personally Reviewed ? ?ECG  ?  ?No new - Personally Reviewed ? ?Physical Exam  ? ?GEN: No acute distress.   ?Neck: No JVD ?Cardiac: Irreg Irreg, no murmurs, rubs, or gallops.  ?Respiratory: Clear to auscultation bilaterally. ?GI: Soft, nontender, non-distended  ?MS: No  edema; No deformity. ?Neuro:  Nonfocal  ?Psych: Normal affect  ? ?Labs  ?  ?High Sensitivity Troponin:   ?Recent Labs  ?Lab 03/27/22 ?7048 03/27/22 ?1415  ?TROPONINIHS 44* 67*  ?   ?Chemistry ?Recent Labs  ?Lab 03/27/22 ?8891 03/27/22 ?1300 03/28/22 ?0422 03/29/22 ?0433 03/30/22 ?6945 03/31/22 ?0409  ?NA 137  --  137 135 134* 137  ?K 3.9  --  4.3 3.7 3.4* 3.8  ?CL 102  --  103 101 102 102  ?CO2 25  --  '26 26 27 27  '$ ?GLUCOSE 156*  --  104* 106* 112* 114*  ?BUN 30*  --  33* 36* 36* 35*  ?CREATININE 2.11*  --  1.93* 1.93* 1.72* 1.68*  ?CALCIUM 9.8  --  8.9 8.9 8.6* 9.1  ?MG  --  1.9  --  2.2  --   --   ?PROT 6.5  --  5.3*  --   --   --   ?ALBUMIN 4.1  --  3.0*  --   --   --   ?AST 29  --  68*  --   --   --   ?ALT  18  --  32  --   --   --   ?ALKPHOS 66  --  42  --   --   --   ?BILITOT 1.3*  --  0.7  --   --   --   ?GFRNONAA 29*  --  32* 32* 37* 38*  ?ANIONGAP 10  --  '8 8 5 8  '$ ?  ?Lipids No results for input(s): CHOL, TRIG, HDL, LABVLDL, LDLCALC, CHOLHDL in the last 168 hours.  ?Hematology ?Recent Labs  ?Lab 03/31/22 ?0409 04/01/22 ?0430 04/02/22 ?0017  ?WBC 6.9 8.0 9.9  ?RBC 3.54* 3.44* 3.86*  ?HGB 11.9* 11.4* 12.7*  ?HCT 35.3* 35.0* 40.1  ?MCV 99.7 101.7* 103.9*  ?MCH 33.6 33.1 32.9  ?MCHC 33.7 32.6 31.7  ?RDW 14.4 14.4 14.6  ?PLT 174 183 212  ? ?Thyroid No results for input(s): TSH, FREET4 in the last 168 hours.  ?BNP ?Recent Labs  ?Lab 03/27/22 ?0934  ?BNP 843.4*  ?  ?DDimer No results for input(s): DDIMER in the last 168 hours.  ? ?Radiology  ?  ?CT ABDOMEN PELVIS W CONTRAST ? ?Result Date: 04/01/2022 ?CLINICAL DATA:  Sepsis strep gallatycus bacteremia- rule out colonic neoplasm EXAM: CT ABDOMEN AND PELVIS WITH CONTRAST TECHNIQUE: Multidetector CT imaging of the abdomen and pelvis was performed using the standard protocol following bolus administration of intravenous contrast. RADIATION DOSE REDUCTION: This exam was performed according to the departmental dose-optimization program which includes automated exposure  control, adjustment of the mA and/or kV according to patient size and/or use of iterative reconstruction technique. CONTRAST:  70m OMNIPAQUE IOHEXOL 300 MG/ML  SOLN COMPARISON:  Abdominal ultrasound 04/13/2013 FINDINGS: Lower chest: Trace right pleural effusion and adjacent atelectasis. Mild cardiomegaly with coronary artery calcifications. Prominent right infrahilar lymph node at 12 mm partially included Hepatobiliary: No focal hepatic abnormality is seen. There is a 3.4 cm lamellated calcified gallstone in the gallbladder, adjacent high density may represent sludge or additional stones. No pericholecystic fat stranding or inflammation. Pancreas: No ductal dilatation or inflammation. Spleen: Normal in size without focal abnormality. Adrenals/Urinary Tract: Left adrenal thickening without nodule. Normal right adrenal gland. There is mild bilateral renal parenchymal thinning. No hydronephrosis. Homogeneous enhancement with symmetric excretion on delayed phase imaging. Distended but unremarkable urinary bladder. Stomach/Bowel: Mild gastric distension with ingested contents and air. There is no gastric wall thickening. No small bowel obstruction or inflammatory change. Administered enteric contrast reaches the ascending colon. The appendix is normal. Moderate volume of colonic stool. Equivocal short segment of colonic wall thickening involving the splenic flexure, series 2, image 30 and series 4, image 31. Portions of the descending and sigmoid colon are not well assessed due to nondistention as well as barium within diverticula from recent esophagram. Descending and sigmoid diverticulosis without focal diverticulitis. No colonic inflammation. Vascular/Lymphatic: Moderate aortic and branch atherosclerosis. Focal 3.6 cm aneurysm involving the distal aorta. Aortic branch vessels are patent. Portal, splenic, and superior mesenteric veins are patent. No enlarged lymph nodes in the abdomen or pelvis. No adenopathy in the  region of possible colonic wall thickening. Reproductive: Prominent prostate gland spans 5.2 cm transverse. Other: No ascites or free air. Small fat containing umbilical hernia Musculoskeletal: Degenerative change throughout the lumbar spine. No focal bone lesion or acute osseous abnormalities IMPRESSION: 1. Equivocal short segment of colonic wall thickening involving the splenic flexure, which may be due to peristalsis, however colonic neoplasm is not excluded. Recommend up-to-date colonoscopy. 2. Cholelithiasis without CT findings of acute cholecystitis. 3. Colonic  diverticulosis without focal diverticulitis. 4. Focal 3.6 cm distal abdominal aortic aneurysm. Recommend follow-up ultrasound every 2 years. This recommendation follows ACR consensus guidelines: White Paper of the ACR Incidental Findings Committee II on Vascular Findings. J Am Coll Radiol 2013; 10:789-794. 5. Trace right pleural effusion and adjacent atelectasis. Aortic Atherosclerosis (ICD10-I70.0). Electronically Signed   By: Keith Rake M.D.   On: 04/01/2022 20:40   ? ?Cardiac Studies  ? ?Echocardiogram 03/28/22  ?1. Left ventricular ejection fraction, by estimation, is 20 to 25%. Left  ?ventricular ejection fraction by 2D MOD biplane is 24.5 %. The left  ?ventricle has severely decreased function. The left ventricle demonstrates  ?global hypokinesis with inferior  ?akinesis. The left ventricular internal cavity size was moderately  ?dilated. There is mild left ventricular hypertrophy. Left ventricular  ?diastolic function could not be evaluated.  ? 2. Right ventricular systolic function is mildly reduced. The right  ?ventricular size is normal. There is mildly elevated pulmonary artery  ?systolic pressure. The estimated right ventricular systolic pressure is  ?90.2 mmHg.  ? 3. Left atrial size was severely dilated.  ? 4. Right atrial size was mildly dilated.  ? 5. The mitral valve has been repaired/replaced. Trivial mitral valve  ?regurgitation.  Mild mitral stenosis. The mean mitral valve gradient is 6.0  ?mmHg with average heart rate of 92 bpm. There is a 29 mm St. Jude  ?mechanical valve present in the mitral  ?position. Procedure Date: 2005 (fol

## 2022-04-02 NOTE — Progress Notes (Addendum)
ANTICOAGULATION CONSULT NOTE - Follow Up Consult ? ?Pharmacy Consult for Warfarin ?Indication: Mechanical MVR/AVR, atrial flutter  ? ?Allergies  ?Allergen Reactions  ? Bee Venom Anaphylaxis  ? Ace Inhibitors Cough  ? Atorvastatin Other (See Comments)  ?  Muscle Pain  ? Pravastatin Other (See Comments)  ?  Muscle Pain  ? Ranexa [Ranolazine] Other (See Comments)  ?  Tachycardia  ? ? ?Patient Measurements: ?Height: '5\' 4"'$  (162.6 cm) ?Weight: 86.2 kg (190 lb) ?IBW/kg (Calculated) : 59.2 ? ?Vital Signs: ?Temp: 97.4 ?F (36.3 ?C) (04/11 1302) ?Temp Source: Oral (04/11 1302) ?BP: 107/68 (04/11 1302) ?Pulse Rate: 86 (04/11 1302) ? ?Labs: ?Recent Labs  ?  03/31/22 ?0409 04/01/22 ?0430 04/02/22 ?2334  ?HGB 11.9* 11.4* 12.7*  ?HCT 35.3* 35.0* 40.1  ?PLT 174 183 212  ?LABPROT 35.4* 42.5* 37.9*  ?INR 3.6* 4.5* 3.9*  ?CREATININE 1.68*  --   --   ? ? ? ?Estimated Creatinine Clearance: 28.4 mL/min (A) (by C-G formula based on SCr of 1.68 mg/dL (H)). ? ? ?Medications:  ?Scheduled:  ? allopurinol  400 mg Oral Daily  ? amiodarone  200 mg Oral Daily  ? bumetanide  2 mg Oral Q breakfast  ? calcitRIOL  0.5 mcg Oral Daily  ? loratadine  10 mg Oral Daily  ? mouth rinse  15 mL Mouth Rinse BID  ? mexiletine  200 mg Oral TID  ? pantoprazole  40 mg Oral Daily  ? potassium chloride SA  20 mEq Oral BID  ? tamsulosin  0.4 mg Oral Daily  ? Warfarin - Pharmacist Dosing Inpatient   Does not apply [id number]  ? ?Infusions:  ? sodium chloride 20 mL/hr at 04/02/22 0035  ? penicillin g continuous IV infusion 6 Million Units (04/02/22 1507)  ? ? ?Assessment: ?Patient is a 88yo male with PMH of vtach, atrial flutter, HTN, anemia, HF with EF 45%, CAD, MVR/AVR and hx of Enterococcus bacteremia who presented to ED on 4/5 with complaints of generalized weakness, n/v, and fever.  ?INR 4.6 on admission, given 2.'5mg'$  Vitamin K PO on 4/5 1112. Barium swallow negative for stricture. Pharmacy consulted for warfarin dosing. Patient given 1 dose SQ Enoxaparin for bridging  while INR subtherapeutic on 4/7.  ? ?PTA warfarin regimen '3mg'$  daily except '6mg'$  on Fridays (regimen confirmed with PCP, Dr. Buel Ream office on 4/6) ? ?Today, 04/02/2022: ?-INR 3.9, still supra-therapeutic but trending down after holding warfarin dose 4/10 ?-CBC: Hgb low but improved to 12.7, Pltc WNL ?-no bleeding issues reported  ?-Drug interactions: Penicillin G can increase INR. Amiodarone is home med with warfarin, so likely not contributing to changes.  ?-100% PO meals charted today ? ?Goal of Therapy:  ?INR 2.5 - 3.5 ?Monitor platelets by anticoagulation protocol: Yes ?  ?Plan:  ?-Hold warfarin dose today  ?-Daily PT/INR ?-CBC monitoring at least q72h ?-Monitor for s/sx bleeding ? ?Lindell Spar, PharmD, BCPS ?Clinical Pharmacist  ?04/02/2022 3:57 PM ? ? ? ?

## 2022-04-03 ENCOUNTER — Inpatient Hospital Stay (HOSPITAL_COMMUNITY): Payer: Medicare Other | Admitting: Anesthesiology

## 2022-04-03 ENCOUNTER — Inpatient Hospital Stay (HOSPITAL_COMMUNITY): Payer: Medicare Other

## 2022-04-03 ENCOUNTER — Encounter (HOSPITAL_COMMUNITY)
Admission: EM | Disposition: A | Discharge status: Home Health | Payer: Self-pay | Source: Home / Self Care | Attending: Internal Medicine

## 2022-04-03 DIAGNOSIS — I4891 Unspecified atrial fibrillation: Secondary | ICD-10-CM

## 2022-04-03 DIAGNOSIS — I110 Hypertensive heart disease with heart failure: Secondary | ICD-10-CM

## 2022-04-03 DIAGNOSIS — J9601 Acute respiratory failure with hypoxia: Secondary | ICD-10-CM | POA: Diagnosis not present

## 2022-04-03 DIAGNOSIS — R7881 Bacteremia: Secondary | ICD-10-CM | POA: Diagnosis not present

## 2022-04-03 DIAGNOSIS — Z952 Presence of prosthetic heart valve: Secondary | ICD-10-CM

## 2022-04-03 DIAGNOSIS — A409 Streptococcal sepsis, unspecified: Secondary | ICD-10-CM | POA: Diagnosis not present

## 2022-04-03 DIAGNOSIS — R6520 Severe sepsis without septic shock: Secondary | ICD-10-CM | POA: Diagnosis not present

## 2022-04-03 DIAGNOSIS — I509 Heart failure, unspecified: Secondary | ICD-10-CM

## 2022-04-03 HISTORY — PX: TEE WITHOUT CARDIOVERSION: SHX5443

## 2022-04-03 LAB — CULTURE, BLOOD (ROUTINE X 2)
Culture: NO GROWTH
Culture: NO GROWTH
Special Requests: ADEQUATE
Special Requests: ADEQUATE

## 2022-04-03 LAB — ECHO TEE
AR max vel: 1.45 cm2
AV Area VTI: 1.72 cm2
AV Area mean vel: 1.86 cm2
AV Mean grad: 12 mmHg
AV Peak grad: 23.6 mmHg
Ao pk vel: 2.43 m/s
MV VTI: 3.19 cm2

## 2022-04-03 LAB — PROTIME-INR
INR: 3.9 — ABNORMAL HIGH (ref 0.8–1.2)
Prothrombin Time: 37.8 seconds — ABNORMAL HIGH (ref 11.4–15.2)

## 2022-04-03 LAB — CREATININE, SERUM
Creatinine, Ser: 1.85 mg/dL — ABNORMAL HIGH (ref 0.61–1.24)
GFR, Estimated: 34 mL/min — ABNORMAL LOW (ref >60)

## 2022-04-03 SURGERY — ECHOCARDIOGRAM, TRANSESOPHAGEAL
Anesthesia: Monitor Anesthesia Care

## 2022-04-03 MED ORDER — PROPOFOL 500 MG/50ML IV EMUL
INTRAVENOUS | Status: DC | PRN
  Administered 2022-04-03: 20 ug/kg/min via INTRAVENOUS

## 2022-04-03 MED ORDER — LIDOCAINE 2% (20 MG/ML) 5 ML SYRINGE
INTRAMUSCULAR | Status: DC | PRN
  Administered 2022-04-03: 20 mg via INTRAVENOUS

## 2022-04-03 MED ORDER — ETOMIDATE 2 MG/ML IV SOLN
INTRAVENOUS | Status: DC | PRN
  Administered 2022-04-03 (×2): 2 mg via INTRAVENOUS

## 2022-04-03 MED ORDER — LACTATED RINGERS IV SOLN: INTRAVENOUS | Status: DC | PRN

## 2022-04-03 NOTE — Anesthesia Procedure Notes (Signed)
Procedure Name: [place] ?Date/Time: 04/03/2022 10:09 AM ?Performed by: Lorie Phenix, CRNA ?Pre-anesthesia Checklist: Patient identified, Emergency Drugs available, Suction available and Patient being monitored ?Patient Re-evaluated:Patient Re-evaluated prior to induction ?Oxygen Delivery Method: Nasal cannula ?Placement Confirmation: positive ETCO2 ? ? ? ? ?

## 2022-04-03 NOTE — Anesthesia Preprocedure Evaluation (Addendum)
Anesthesia Evaluation  ?Patient identified by MRN, date of birth, ID band ?Patient awake ? ? ? ?Reviewed: ?Allergy & Precautions, NPO status , Patient's Chart, lab work & pertinent test results ? ?Airway ?Mallampati: III ? ?TM Distance: >3 FB ?Neck ROM: Full ? ? ? Dental ? ?(+) Edentulous Lower, Edentulous Upper ?  ?Pulmonary ?former smoker,  ?  ?Pulmonary exam normal ?breath sounds clear to auscultation ? ? ? ? ? ? Cardiovascular ?hypertension (137/70 in preop), Pt. on medications ?+CHF (LVEF 20-25%)  ?+ dysrhythmias (coumadin ) Atrial Fibrillation + Valvular Problems/Murmurs (s/p AVR and MVR 18 years ago, mild MS and mild AS on last echo 03/2022)  ?Rhythm:Irregular Rate:Normal ? ?Echo 03/28/22: ??1. Left ventricular ejection fraction, by estimation, is 20 to 25%. Left  ?ventricular ejection fraction by 2D MOD biplane is 24.5 %. The left  ?ventricle has severely decreased function. The left ventricle demonstrates  ?global hypokinesis with inferior  ?akinesis. The left ventricular internal cavity size was moderately  ?dilated. There is mild left ventricular hypertrophy. Left ventricular  ?diastolic function could not be evaluated.  ??2. Right ventricular systolic function is mildly reduced. The right  ?ventricular size is normal. There is mildly elevated pulmonary artery  ?systolic pressure. The estimated right ventricular systolic pressure is  ?40.9 mmHg.  ??3. Left atrial size was severely dilated.  ??4. Right atrial size was mildly dilated.  ??5. The mitral valve has been repaired/replaced. Trivial mitral valve  ?regurgitation. Mild mitral stenosis. The mean mitral valve gradient is 6.0  ?mmHg with average heart rate of 92 bpm. There is a 29 mm St. Jude  ?mechanical valve present in the mitral  ?position. Procedure Date: 2005 (followed at [facility]). Echo findings are  ?consistent with normal structure and function of the mitral valve  ?prosthesis.  ??6. The aortic valve has been  repaired/replaced. Aortic valve  ?regurgitation is trivial. Mild aortic valve stenosis. There is a 21 mm St.  ?Jude mechanical valve present in the aortic position. Procedure Date: 2005  ?(followed at [facility] Echo findings are  ?consistent with normal structure and function of the aortic valve  ?prosthesis. Aortic valve mean gradient measures 15.0 mmHg. Aortic valve  ?Vmax measures 2.50 m/s. (note the gradient may be falsely lower due to  ?reduced cardiac output)  ??7. Aortic dilatation noted. There is borderline dilatation of the  ?ascending aorta, measuring 38 mm.  ??8. The inferior vena cava is dilated in size with <50% respiratory  ?variability, suggesting right atrial pressure of 15 mmHg.  ?  ?Neuro/Psych ?negative neurological ROS ? negative psych ROS  ? GI/Hepatic ?negative GI ROS, Neg liver ROS,   ?Endo/Other  ?negative endocrine ROS ? Renal/GU ?Renal InsufficiencyRenal diseaseCKD, cr 1.85  ?negative genitourinary ?  ?Musculoskeletal ? ?(+) Arthritis , Osteoarthritis,   ? Abdominal ?  ?Peds ? Hematology ? ?(+) Blood dyscrasia, anemia , Hb 12.7 ?   ?Anesthesia Other Findings ? ? Reproductive/Obstetrics ?negative OB ROS ? ?  ? ? ? ? ? ? ? ? ? ? ? ? ? ?  ?  ? ? ? ? ? ? ?Anesthesia Physical ?Anesthesia Plan ? ?ASA: 3 ? ?Anesthesia Plan: General  ? ?Post-op Pain Management:   ? ?Induction: Intravenous ? ?PONV Risk Score and Plan: TIVA, Treatment may vary due to age or medical condition and Propofol infusion ? ?Airway Management Planned: Natural Airway and Mask ? ?Additional Equipment: None ? ?Intra-op Plan:  ? ?Post-operative Plan:  ? ?Informed Consent: I have reviewed the patients History and  Physical, chart, labs and discussed the procedure including the risks, benefits and alternatives for the proposed anesthesia with the patient or authorized representative who has indicated his/her understanding and acceptance.  ? ? ? ?Dental advisory given ? ?Plan Discussed with: CRNA ? ?Anesthesia Plan Comments:    ? ? ? ? ? ? ?Anesthesia Quick Evaluation ? ?

## 2022-04-03 NOTE — Progress Notes (Signed)
* ?  Echocardiogram ?Echocardiogram Transesophageal has been performed. ? Steve Riddle ?04/03/2022, 11:01 AM ?

## 2022-04-03 NOTE — Progress Notes (Signed)
? ?Progress Note ? ?Patient Name: Steve Riddle. ?Date of Encounter: 04/03/2022 ? ?[facility] Cardiologist: [facility] ? ?Subjective  ? ?INR today 3.9. Feels slightly better than days prior. ? ?Inpatient Medications  ?  ?Scheduled Meds: ? [MAR Hold] allopurinol  400 mg Oral Daily  ? [MAR Hold] amiodarone  200 mg Oral Daily  ? [MAR Hold] bumetanide  2 mg Oral Q breakfast  ? [MAR Hold] calcitRIOL  0.5 mcg Oral Daily  ? [MAR Hold] loratadine  10 mg Oral Daily  ? [MAR Hold] mouth rinse  15 mL Mouth Rinse BID  ? [MAR Hold] mexiletine  200 mg Oral TID  ? [MAR Hold] pantoprazole  40 mg Oral Daily  ? [MAR Hold] potassium chloride SA  20 mEq Oral BID  ? [MAR Hold] tamsulosin  0.4 mg Oral Daily  ? [MAR Hold] Warfarin - Pharmacist Dosing Inpatient   Does not apply Z0017  ? ?Continuous Infusions: ? sodium chloride 20 mL/hr at 04/02/22 0035  ? [MAR Hold] penicillin g continuous IV infusion 6 Million Units (04/03/22 0341)  ? ?PRN Meds: ?[MAR Hold] acetaminophen **OR** [MAR Hold] acetaminophen, [MAR Hold] guaiFENesin, [MAR Hold] levalbuterol, [MAR Hold] ondansetron **OR** [MAR Hold] ondansetron (ZOFRAN) IV, [MAR Hold] simethicone  ? ?Vital Signs  ?  ?Vitals:  ? 04/02/22 1302 04/02/22 2321 04/03/22 0441 04/03/22 0832  ?BP: 107/68 103/62 117/72 137/70  ?Pulse: 86 88 87 91  ?Resp: 20 18    ?Temp: (!) 97.4 ?F (36.3 ?C) 98.4 ?F (36.9 ?C) 98.1 ?F (36.7 ?C) (!) 97.3 ?F (36.3 ?C)  ?TempSrc: Oral Oral Oral Temporal  ?SpO2: 99% 94% 90% 96%  ?Weight:    86.2 kg  ?Height:    '5\' 4"'$  (1.626 m)  ? ? ?Intake/Output Summary (Last 24 hours) at 04/03/2022 0846 ?Last data filed at 04/03/2022 0341 ?Gross per 24 hour  ?Intake 1353.67 ml  ?Output 1250 ml  ?Net 103.67 ml  ? ? ?  04/03/2022  ?  8:32 AM 03/27/2022  ?  4:22 PM 03/27/2022  ?  9:19 AM  ?Last 3 Weights  ?Weight (lbs) 190 lb 0.6 oz 190 lb 195 lb 1.7 oz  ?Weight (kg) 86.2 kg 86.183 kg 88.5 kg  ?   ? ?Telemetry  ?  ?Regular rhythm, aflutter vs SR- Personally Reviewed ? ?ECG  ?  ?pending- Personally  Reviewed ? ?Physical Exam  ? ?GEN: No acute distress.   ?Neck: No JVD ?Cardiac: reg. Valve closure sound normal. ?Respiratory: Clear to auscultation bilaterally. ?GI: Soft, nontender, non-distended  ?MS: No edema; No deformity. ?Neuro:  Nonfocal  ?Psych: Normal affect  ? ?Labs  ?  ?High Sensitivity Troponin:   ?Recent Labs  ?Lab 03/27/22 ?4944 03/27/22 ?1415  ?TROPONINIHS 44* 67*  ?   ?Chemistry ?Recent Labs  ?Lab 03/27/22 ?9675 03/27/22 ?1300 03/28/22 ?0422 03/29/22 ?0433 03/30/22 ?9163 03/31/22 ?0409 04/03/22 ?0448  ?NA 137  --  137 135 134* 137  --   ?K 3.9  --  4.3 3.7 3.4* 3.8  --   ?CL 102  --  103 101 102 102  --   ?CO2 25  --  '26 26 27 27  '$ --   ?GLUCOSE 156*  --  104* 106* 112* 114*  --   ?BUN 30*  --  33* 36* 36* 35*  --   ?CREATININE 2.11*  --  1.93* 1.93* 1.72* 1.68* 1.85*  ?CALCIUM 9.8  --  8.9 8.9 8.6* 9.1  --   ?MG  --  1.9  --  2.2  --   --   --   ?PROT 6.5  --  5.3*  --   --   --   --   ?ALBUMIN 4.1  --  3.0*  --   --   --   --   ?AST 29  --  68*  --   --   --   --   ?ALT 18  --  32  --   --   --   --   ?ALKPHOS 66  --  42  --   --   --   --   ?BILITOT 1.3*  --  0.7  --   --   --   --   ?GFRNONAA 29*  --  32* 32* 37* 38* 34*  ?ANIONGAP 10  --  '8 8 5 8  '$ --   ?  ?Lipids No results for input(s): CHOL, TRIG, HDL, LABVLDL, LDLCALC, CHOLHDL in the last 168 hours.  ?Hematology ?Recent Labs  ?Lab 03/31/22 ?0409 04/01/22 ?0430 04/02/22 ?3300  ?WBC 6.9 8.0 9.9  ?RBC 3.54* 3.44* 3.86*  ?HGB 11.9* 11.4* 12.7*  ?HCT 35.3* 35.0* 40.1  ?MCV 99.7 101.7* 103.9*  ?MCH 33.6 33.1 32.9  ?MCHC 33.7 32.6 31.7  ?RDW 14.4 14.4 14.6  ?PLT 174 183 212  ? ?Thyroid No results for input(s): TSH, FREET4 in the last 168 hours.  ?BNP ?Recent Labs  ?Lab 03/27/22 ?0934  ?BNP 843.4*  ?  ?DDimer No results for input(s): DDIMER in the last 168 hours.  ? ?Radiology  ?  ?CT ABDOMEN PELVIS W CONTRAST ? ?Result Date: 04/01/2022 ?CLINICAL DATA:  Sepsis strep gallatycus bacteremia- rule out colonic neoplasm EXAM: CT ABDOMEN AND PELVIS WITH  CONTRAST TECHNIQUE: Multidetector CT imaging of the abdomen and pelvis was performed using the standard protocol following bolus administration of intravenous contrast. RADIATION DOSE REDUCTION: This exam was performed according to the departmental dose-optimization program which includes automated exposure control, adjustment of the mA and/or kV according to patient size and/or use of iterative reconstruction technique. CONTRAST:  28m OMNIPAQUE IOHEXOL 300 MG/ML  SOLN COMPARISON:  Abdominal ultrasound 04/13/2013 FINDINGS: Lower chest: Trace right pleural effusion and adjacent atelectasis. Mild cardiomegaly with coronary artery calcifications. Prominent right infrahilar lymph node at 12 mm partially included Hepatobiliary: No focal hepatic abnormality is seen. There is a 3.4 cm lamellated calcified gallstone in the gallbladder, adjacent high density may represent sludge or additional stones. No pericholecystic fat stranding or inflammation. Pancreas: No ductal dilatation or inflammation. Spleen: Normal in size without focal abnormality. Adrenals/Urinary Tract: Left adrenal thickening without nodule. Normal right adrenal gland. There is mild bilateral renal parenchymal thinning. No hydronephrosis. Homogeneous enhancement with symmetric excretion on delayed phase imaging. Distended but unremarkable urinary bladder. Stomach/Bowel: Mild gastric distension with ingested contents and air. There is no gastric wall thickening. No small bowel obstruction or inflammatory change. Administered enteric contrast reaches the ascending colon. The appendix is normal. Moderate volume of colonic stool. Equivocal short segment of colonic wall thickening involving the splenic flexure, series 2, image 30 and series 4, image 31. Portions of the descending and sigmoid colon are not well assessed due to nondistention as well as barium within diverticula from recent esophagram. Descending and sigmoid diverticulosis without focal  diverticulitis. No colonic inflammation. Vascular/Lymphatic: Moderate aortic and branch atherosclerosis. Focal 3.6 cm aneurysm involving the distal aorta. Aortic branch vessels are patent. Portal, splenic, and superior mesenteric veins are patent. No enlarged lymph nodes in the abdomen or pelvis.  No adenopathy in the region of possible colonic wall thickening. Reproductive: Prominent prostate gland spans 5.2 cm transverse. Other: No ascites or free air. Small fat containing umbilical hernia Musculoskeletal: Degenerative change throughout the lumbar spine. No focal bone lesion or acute osseous abnormalities IMPRESSION: 1. Equivocal short segment of colonic wall thickening involving the splenic flexure, which may be due to peristalsis, however colonic neoplasm is not excluded. Recommend up-to-date colonoscopy. 2. Cholelithiasis without CT findings of acute cholecystitis. 3. Colonic diverticulosis without focal diverticulitis. 4. Focal 3.6 cm distal abdominal aortic aneurysm. Recommend follow-up ultrasound every 2 years. This recommendation follows ACR consensus guidelines: White Paper of the ACR Incidental Findings Committee II on Vascular Findings. J Am Coll Radiol 2013; 10:789-794. 5. Trace right pleural effusion and adjacent atelectasis. Aortic Atherosclerosis (ICD10-I70.0). Electronically Signed   By: Keith Rake M.D.   On: 04/01/2022 20:40   ? ?Cardiac Studies  ? ?Echocardiogram 03/28/22  ?1. Left ventricular ejection fraction, by estimation, is 20 to 25%. Left  ?ventricular ejection fraction by 2D MOD biplane is 24.5 %. The left  ?ventricle has severely decreased function. The left ventricle demonstrates  ?global hypokinesis with inferior  ?akinesis. The left ventricular internal cavity size was moderately  ?dilated. There is mild left ventricular hypertrophy. Left ventricular  ?diastolic function could not be evaluated.  ? 2. Right ventricular systolic function is mildly reduced. The right  ?ventricular size  is normal. There is mildly elevated pulmonary artery  ?systolic pressure. The estimated right ventricular systolic pressure is  ?09.8 mmHg.  ? 3. Left atrial size was severely dilated.  ? 4. Right atrial size was mildly

## 2022-04-03 NOTE — Interval H&P Note (Signed)
History and Physical Interval Note: ? ?04/03/2022 ?9:48 AM ? ?Steve Riddle.  has presented today for surgery, with the diagnosis of a fib.  The various methods of treatment have been discussed with the patient and family. After consideration of risks, benefits and other options for treatment, the patient has consented to  Procedure(s): ?TRANSESOPHAGEAL ECHOCARDIOGRAM (TEE) (N/A) ?CARDIOVERSION (N/A) as a surgical intervention.  The patient's history has been reviewed, patient examined, no change in status, stable for surgery.  I have reviewed the patient's chart and labs.  Questions were answered to the patient's satisfaction.   ? ? ?Freada Bergeron ? ? ?

## 2022-04-03 NOTE — Progress Notes (Signed)
Physical Therapy Treatment ?Patient Details ?Name: Steve Riddle. ?MRN: [id number] ?DOB: 1929-07-11 ?Today's Date: 04/03/2022 ? ? ?History of Present Illness Pt is 88 y.o. male who presented to the emergency department on 03/27/22 with complaints of progressively worse generalized weakness for the past couple of weeks. Dx of sepsis due to undetermined organism, respiratory failure with hypoxia. Pt with medical history significant of class I obesity, gout, secondary hyperparathyroidism, paroxysmal ventricular tachycardia on amiodarone and mexiletine, history of atrial flutter, history of CTI ablation April 2022 at [facility], history of systolic heart failure with an EF of 45%, most recently diagnosed with chronic heart failure with preserved EF, hyperlipidemia, hypertension,CAD, history of COVID myocarditis, history of mechanical heart valves, history of Enterococcus bacteremia treated empirically with vancomycin ? ?  ?PT Comments  ? ? Patient making good progress with mobility and continues to be motivated. Patient completed bed mobility and transfers with supervision for safety. Min guard/supervision for gait with RW and VSS throughout. EOS instructed pt in functional LE exercises for sit<>stands. Anticipate he will continue to progress well with no follow up PT needs. Will continue to progress and provide HEP for home exercises. ? ?  ?Recommendations for follow up therapy are one component of a multi-disciplinary discharge planning process, led by the attending physician.  Recommendations may be updated based on patient status, additional functional criteria and insurance authorization. ? ?Follow Up Recommendations ? No PT follow up ?  ?  ?Assistance Recommended at Discharge Set up Supervision/Assistance  ?Patient can return home with the following A little help with bathing/dressing/bathroom ?  ?Equipment Recommendations ? None recommended by PT  ?  ?Recommendations for Other Services   ? ? ?  ?Precautions / Restrictions  Precautions ?Precautions: Other (comment) ?Precaution Comments: monitor O2 ?Restrictions ?Weight Bearing Restrictions: No  ?  ? ?Mobility ? Bed Mobility ?Overal bed mobility: Needs Assistance ?Bed Mobility: Supine to Sit ?  ?  ?Supine to sit: Supervision, HOB elevated ?  ?  ?General bed mobility comments: supervision for safety ?  ? ?Transfers ?Overall transfer level: Needs assistance ?Equipment used: Rolling walker (2 wheels) ?Transfers: Sit to/from Stand ?Sit to Stand: Supervision ?  ?  ?  ?  ?  ?General transfer comment: supervision for safety, VC for hand placement ?  ? ?Ambulation/Gait ?Ambulation/Gait assistance: Min guard, Supervision ?Gait Distance (Feet): 120 Feet ?Assistive device: Rolling walker (2 wheels) ?Gait Pattern/deviations: Step-through pattern, Decreased stride length ?Gait velocity: decr ?  ?  ?General Gait Details: Min cues for RW proximity; VSS on RA, pt HR ranging from 70's-110's. ? ? ?Stairs ?  ?  ?  ?  ?  ? ? ?Wheelchair Mobility ?  ? ?Modified Rankin (Stroke Patients Only) ?  ? ? ?  ?Balance   ?  ?  ?  ?  ?  ?  ?  ?  ?  ?  ?  ?  ?  ?  ?  ?  ?  ?  ?  ? ?  ?Cognition Arousal/Alertness: Awake/alert ?Behavior During Therapy: [facility] for tasks assessed/performed ?Overall Cognitive Status: Within Functional Limits for tasks assessed ?  ?  ?  ?  ?  ?  ?  ?  ?  ?  ?  ?  ?  ?  ?  ?  ?  ?  ?  ? ?  ?Exercises Other Exercises ?Other Exercises: 5x sit<>stand from recliner, bil UE use for power up ? ?  ?General Comments   ?  ?  ? ?  Pertinent Vitals/Pain Pain Assessment ?Pain Assessment: No/denies pain  ? ? ?Home Living   ?  ?  ?  ?  ?  ?  ?  ?  ?  ?   ?  ?Prior Function    ?  ?  ?   ? ?PT Goals (current goals can now be found in the care plan section) Acute Rehab PT Goals ?PT Goal Formulation: With patient/family ?Time For Goal Achievement: 04/12/22 ?Potential to Achieve Goals: Good ?Progress towards PT goals: Progressing toward goals ? ?  ?Frequency ? ? ? Min 3X/week ? ? ? ?  ?PT Plan Current plan remains  appropriate  ? ? ?Co-evaluation   ?  ?  ?  ?  ? ?  ?AM-PAC PT "6 Clicks" Mobility   ?Outcome Measure ? Help needed turning from your back to your side while in a flat bed without using bedrails?: None ?Help needed moving from lying on your back to sitting on the side of a flat bed without using bedrails?: None ?Help needed moving to and from a bed to a chair (including a wheelchair)?: A Little ?Help needed standing up from a chair using your arms (e.g., wheelchair or bedside chair)?: A Little ?Help needed to walk in hospital room?: A Little ?Help needed climbing 3-5 steps with a railing? : A Little ?6 Click Score: 20 ? ?  ?End of Session Equipment Utilized During Treatment: Gait belt ?Activity Tolerance: Patient tolerated treatment well ?Patient left: in chair;with call bell/phone within reach;with family/visitor present;with chair alarm set ?Nurse Communication: Mobility status ?PT Visit Diagnosis: Difficulty in walking, not elsewhere classified (R26.2) ?  ? ? ?Time: 3762-8315 ?PT Time Calculation (min) (ACUTE ONLY): 19 min ? ?Charges:  $Therapeutic Exercise: 8-22 mins          ?          ? ?Gwynneth Albright PT, DPT ?Acute Rehabilitation Services ?Office [phone] ?Pager [phone]  ? ? ?Steve Riddle ?04/03/2022, 2:13 PM ? ?

## 2022-04-03 NOTE — Progress Notes (Signed)
ANTICOAGULATION CONSULT NOTE - Follow Up Consult ? ?Pharmacy Consult for Warfarin ?Indication: Mechanical MVR/AVR, atrial flutter  ? ?Allergies  ?Allergen Reactions  ? Bee Venom Anaphylaxis  ? Ace Inhibitors Cough  ? Atorvastatin Other (See Comments)  ?  Muscle Pain  ? Pravastatin Other (See Comments)  ?  Muscle Pain  ? Ranexa [Ranolazine] Other (See Comments)  ?  Tachycardia  ? ? ?Patient Measurements: ?Height: '5\' 4"'$  (162.6 cm) ?Weight: 86.2 kg (190 lb) ?IBW/kg (Calculated) : 59.2 ? ?Vital Signs: ?Temp: 98.1 ?F (36.7 ?C) (04/12 0441) ?Temp Source: Oral (04/12 0441) ?BP: 117/72 (04/12 0441) ?Pulse Rate: 87 (04/12 0441) ? ?Labs: ?Recent Labs  ?  04/01/22 ?0430 04/02/22 ?7412 04/03/22 ?0448  ?HGB 11.4* 12.7*  --   ?HCT 35.0* 40.1  --   ?PLT 183 212  --   ?LABPROT 42.5* 37.9* 37.8*  ?INR 4.5* 3.9* 3.9*  ?CREATININE  --   --  1.85*  ? ? ? ?Estimated Creatinine Clearance: 25.8 mL/min (A) (by C-G formula based on SCr of 1.85 mg/dL (H)). ? ? ?Assessment: ?Patient is a 88yo male with PMH of vtach, atrial flutter, HTN, anemia, HF with EF 45%, CAD, MVR/AVR and hx of Enterococcus bacteremia who presented to ED on 4/5 with complaints of generalized weakness, n/v, and fever.  ?INR 4.6 on admission, given 2.'5mg'$  Vitamin K PO on 4/5 1112. Barium swallow negative for stricture. Pharmacy consulted for warfarin dosing. Patient given 1 dose SQ Enoxaparin for bridging while INR subtherapeutic on 4/7.  ? ?PTA warfarin regimen '3mg'$  daily except '6mg'$  on Fridays (regimen confirmed with PCP, Dr. Buel Ream office on 4/6) ? ?Today, 04/03/22: ?-INR remains 3.9, still supra-therapeutic after holding warfarin doses  4/10, 4/11 ?-CBC (4/11): Hgb low but improved to 12.7, Pltc WNL ?-no bleeding issues reported  ?-Drug interactions: Penicillin G can increase INR. Amiodarone is home med with warfarin, so likely not contributing to changes.  ?-NPO this AM for TEE ? ?Goal of Therapy:  ?INR 2.5-3.5 ?Monitor platelets by anticoagulation protocol: Yes ?   ?Plan:  ?-Hold warfarin dose today  ?-Daily PT/INR ?-CBC monitoring at least q72h ?-Monitor for s/sx bleeding ? ? ?Lindell Spar, PharmD, BCPS ?Clinical Pharmacist  ?04/03/2022 8:08 AM ?

## 2022-04-03 NOTE — Transfer of Care (Signed)
Immediate Anesthesia Transfer of Care Note ? ?Patient: Steve Riddle. ? ?Procedure(s) Performed: TRANSESOPHAGEAL ECHOCARDIOGRAM (TEE) ? ?Patient Location: Endoscopy Unit ? ?Anesthesia Type:MAC ? ?Level of Consciousness: awake and alert  ? ?Airway & Oxygen Therapy: Patient Spontanous Breathing ? ?Post-op Assessment: Report given to RN and Post -op Vital signs reviewed and stable ? ?Post vital signs: Reviewed and stable ? ?Last Vitals:  ?Vitals Value Taken Time  ?BP 120/74 04/03/22 1041  ?Temp    ?Pulse 88   ?Resp 16 04/03/22 1041  ?SpO2 95   ?Vitals shown include unvalidated device data. ? ?Last Pain:  ?Vitals:  ? 04/03/22 0832  ?TempSrc: Temporal  ?PainSc:   ?   ? ?Patients Stated Pain Goal: 1 (03/29/22 1719) ? ?Complications: No notable events documented. ?

## 2022-04-03 NOTE — Progress Notes (Signed)
Pt's son, Heron Sabins, called and made aware that patient is being transported to [facility] for TEE procedure. Verbalized understanding. ?

## 2022-04-03 NOTE — Progress Notes (Signed)
?PROGRESS NOTE ? ? ? ?Steve Riddle.  PQZ:[id number] DOB: [birth year] DOA: 03/27/2022 ?PCP: [name], MD  ? ?  ?Brief Narrative:  ?Steve Riddle. is a 88 yo male with class I obesity, gout, secondary hyperparathyroidism, paroxysmal ventricular tachycardia on amiodarone and mexiletine, history of atrial flutter, history of CTI ablation April 2022 at [facility], history of systolic heart failure with an EF of 45%, most recently diagnosed with chronic heart failure with preserved EF, hyperlipidemia, hypertension,CAD, history of COVID myocarditis, history of mechanical heart valves, history of Enterococcus bacteremia treated empirically with vancomycin presented with generalized weakness progressively worse for couple of weeks, developed nausea and vomiting after eating grits for dinner followed by fever and more episodes of nausea vomiting, so came to the ED for evaluation where he was also short of breath placed on 2 L nasal cannula.   ? ?BCID positive for strep gallolyticus bacteremia.  ID and cardiology consulted.  ? ?New events last 24 hours / Subjective: ?Patient seen after his TEE.  He ate lunch postprocedure and has no physical complaints.  Denies any chest pain, shortness of breath, nausea or vomiting. ? ?Assessment & Plan: ?  ?Principal Problem: ?  Streptococcal sepsis, unspecified ([place]) ?Active Problems: ?  Hyperlipidemia ?  Hypertension ?  Class 1 obesity ?  Atrial fibrillation ([place]) ?  Chronic gouty arthritis ?  Chronic heart failure with preserved ejection fraction ([place]) ?  Coronary artery disease ?  History of colonic polyps ?  Paroxysmal ventricular tachycardia ([place]) ?  S/P MVR (mitral valve replacement) ?  S/P AVR ?  Secondary hyperparathyroidism ([place]) ?  Supratherapeutic INR ?  Elevated troponin ?  Acute respiratory failure with hypoxia ([place]) ?  Thrombocytopenia ([place]) ?  CKD (chronic kidney disease) stage 4, GFR 15-29 ml/min (HCC) ?  Streptococcal bacteremia ?  Acute bacterial endocarditis ?   Infection due to Streptococcus gallolyticus ? ?High grade strep gallolyticus bacteremia/Sepsis r/o Endocarditis ?previous history of enterococcal endocarditis in 2021, and with 2 mechanical valves: ?-Status post TEE 4/12 without vegetation ?-Family declined colonoscopy, GI signed off 4/11  ?-Infectious disease following. Discussed with Dr. Tommy Medal over the phone today  ?-Continue penicillin G ?  ?Dysphagia/choking while eating ?-Esophagogram w/ no stricture,but some dysmotility, no need for EGD as per GI, speech following, on Dys 3 diet.  Continue with aspiration precaution ?  ?Chronic systolic and diastolic heart failure ?-Continue Bumex ?  ?Paroxysmal ventricular tachycardia  ?Paroxysmal atrial fibrillation s/p ablation 03/2021 ?S/P MVR ?S/P AVR ?Previous history of endocarditis ?-Continue Coumadin (INR goal 2.5-3.5), amiodarone, mexiletine  ?-In normal sinus rhythm and did not need DCCV today ?-Telemetry  ? ?Chronic gouty arthritis ?-Continue allopurinol ? ?Secondary hyperparathyroidism ?-Continue calcitriol ? ?CKD stage 4 ?-Stable ? ?Class I Obesity ?-Estimated body mass index is 32.62 kg/m? as calculated from the following: ?  Height as of this encounter: '5\' 4"'$  (1.626 m). ?  Weight as of this encounter: 86.2 kg. ?  ?3.6 cm distal abdominal aortic aneurysm  ?-Follow-up with cardiology  ? ?DVT prophylaxis: Coumadin ? ? ?Code Status: Full code ?Family Communication: Spouse at bedside ?Disposition Plan:  ?Status is: Inpatient ?Remains inpatient appropriate because: Remains on IV antibiotics, further recommendation from infectious disease to follow today post TEE result ? ? ?Antimicrobials:  ?Anti-infectives (From admission, onward)  ? ? Start     Dose/Rate Route Frequency Ordered Stop  ? 03/29/22 1200  vancomycin (VANCOREADY) IVPB 1250 mg/250 mL  Status:  Discontinued       ?  1,250 mg ?166.7 mL/hr over 90 Minutes Intravenous Every 48 hours 03/27/22 1152 03/28/22 0935  ? 03/29/22 1100  penicillin G potassium 6  Million Units in dextrose 5 % 500 mL continuous infusion       ? 6 Million Units ?41.7 mL/hr over 12 Hours Intravenous Every 12 hours 03/29/22 0937    ? 03/28/22 1100  cefTRIAXone (ROCEPHIN) 1 g in sodium chloride 0.9 % 100 mL IVPB  Status:  Discontinued       ? 1 g ?200 mL/hr over 30 Minutes Intravenous Every 24 hours 03/27/22 1617 03/28/22 0935  ? 03/28/22 1100  cefTRIAXone (ROCEPHIN) 2 g in sodium chloride 0.9 % 100 mL IVPB  Status:  Discontinued       ? 2 g ?200 mL/hr over 30 Minutes Intravenous Every 24 hours 03/28/22 0935 03/29/22 0937  ? 03/27/22 2359  metroNIDAZOLE (FLAGYL) IVPB 500 mg  Status:  Discontinued       ? 500 mg ?100 mL/hr over 60 Minutes Intravenous Every 12 hours 03/27/22 1600 03/28/22 0935  ? 03/27/22 1300  vancomycin (VANCOCIN) 500 mg in sodium chloride 0.9 % 100 mL IVPB       ?See Hyperspace for full Linked Orders Report.  ? 500 mg ?100 mL/hr over 60 Minutes Intravenous  Once 03/27/22 1144 03/27/22 1314  ? 03/27/22 1145  metroNIDAZOLE (FLAGYL) IVPB 500 mg  Status:  Discontinued       ? 500 mg ?100 mL/hr over 60 Minutes Intravenous Every 12 hours 03/27/22 1137 03/27/22 1600  ? 03/27/22 1145  vancomycin (VANCOCIN) IVPB 1000 mg/200 mL premix       ?See Hyperspace for full Linked Orders Report.  ? 1,000 mg ?200 mL/hr over 60 Minutes Intravenous  Once 03/27/22 1144 03/27/22 1418  ? 03/27/22 1100  cefTRIAXone (ROCEPHIN) 1 g in sodium chloride 0.9 % 100 mL IVPB       ? 1 g ?200 mL/hr over 30 Minutes Intravenous  Once 03/27/22 1049 03/27/22 1143  ? 03/27/22 1100  azithromycin (ZITHROMAX) 500 mg in sodium chloride 0.9 % 250 mL IVPB  Status:  Discontinued       ? 500 mg ?250 mL/hr over 60 Minutes Intravenous  Once 03/27/22 1049 03/27/22 1137  ? ?  ? ? ? ?Objective: ?Vitals:  ? 04/03/22 1042 04/03/22 1047 04/03/22 1048 04/03/22 1130  ?BP: 120/74  115/61 124/78  ?Pulse: 87 88 88 91  ?Resp: '16 16 18 18  '$ ?Temp:    97.6 ?F (36.4 ?C)  ?TempSrc:    Oral  ?SpO2: 96% 94% 99% 97%  ?Weight:      ?Height:       ? ? ?Intake/Output Summary (Last 24 hours) at 04/03/2022 1410 ?Last data filed at 04/03/2022 1314 ?Gross per 24 hour  ?Intake 2032.04 ml  ?Output 850 ml  ?Net 1182.04 ml  ? ?Filed Weights  ? 03/27/22 0919 03/27/22 1622 04/03/22 0832  ?Weight: 88.5 kg 86.2 kg 86.2 kg  ? ? ?Examination:  ?General exam: Appears calm and comfortable  ?Respiratory system: Clear to auscultation. Respiratory effort normal. No respiratory distress. No conversational dyspnea.  ?Cardiovascular system: S1 & S2 heard, RRR ?Gastrointestinal system: Abdomen is nondistended, soft and nontender. Normal bowel sounds heard. ?Central nervous system: Alert and oriented. No focal neurological deficits. Speech clear.  ?Extremities: Symmetric in appearance  ?Skin: No rashes, lesions or ulcers on exposed skin  ?Psychiatry: Judgement and insight appear normal. Mood & affect appropriate.  ? ?Data Reviewed: I have personally reviewed following  labs and imaging studies ? ?CBC: ?Recent Labs  ?Lab 03/28/22 ?0422 03/29/22 ?0433 03/30/22 ?1610 03/31/22 ?0409 04/01/22 ?0430 04/02/22 ?9604  ?WBC 9.6 8.7 7.0 6.9 8.0 9.9  ?NEUTROABS 9.1* 7.5  --   --   --   --   ?HGB 11.8* 12.1* 11.7* 11.9* 11.4* 12.7*  ?HCT 36.3* 35.6* 34.5* 35.3* 35.0* 40.1  ?MCV 103.1* 100.6* 99.4 99.7 101.7* 103.9*  ?PLT 129* 139* 140* 174 183 212  ? ?Basic Metabolic Panel: ?Recent Labs  ?Lab 03/28/22 ?0422 03/29/22 ?0433 03/30/22 ?5409 03/31/22 ?0409 04/03/22 ?0448  ?NA 137 135 134* 137  --   ?K 4.3 3.7 3.4* 3.8  --   ?CL 103 101 102 102  --   ?CO2 '26 26 27 27  '$ --   ?GLUCOSE 104* 106* 112* 114*  --   ?BUN 33* 36* 36* 35*  --   ?CREATININE 1.93* 1.93* 1.72* 1.68* 1.85*  ?CALCIUM 8.9 8.9 8.6* 9.1  --   ?MG  --  2.2  --   --   --   ? ?GFR: ?Estimated Creatinine Clearance: 25.8 mL/min (A) (by C-G formula based on SCr of 1.85 mg/dL (H)). ?Liver Function Tests: ?Recent Labs  ?Lab 03/28/22 ?0422  ?AST 68*  ?ALT 32  ?ALKPHOS 42  ?BILITOT 0.7  ?PROT 5.3*  ?ALBUMIN 3.0*  ? ?No results for input(s): LIPASE,  AMYLASE in the last 168 hours. ?No results for input(s): AMMONIA in the last 168 hours. ?Coagulation Profile: ?Recent Labs  ?Lab 03/30/22 ?8119 03/31/22 ?0409 04/01/22 ?0430 04/02/22 ?1478 04/03/22 ?0448  ?INR 3.8* 3.

## 2022-04-03 NOTE — H&P (View-Only) (Signed)
? ?Progress Note ? ?Patient Name: Steve Riddle. ?Date of Encounter: 04/03/2022 ? ?[facility] Cardiologist: [facility] ? ?Subjective  ? ?INR today 3.9. Feels slightly better than days prior. ? ?Inpatient Medications  ?  ?Scheduled Meds: ? [MAR Hold] allopurinol  400 mg Oral Daily  ? [MAR Hold] amiodarone  200 mg Oral Daily  ? [MAR Hold] bumetanide  2 mg Oral Q breakfast  ? [MAR Hold] calcitRIOL  0.5 mcg Oral Daily  ? [MAR Hold] loratadine  10 mg Oral Daily  ? [MAR Hold] mouth rinse  15 mL Mouth Rinse BID  ? [MAR Hold] mexiletine  200 mg Oral TID  ? [MAR Hold] pantoprazole  40 mg Oral Daily  ? [MAR Hold] potassium chloride SA  20 mEq Oral BID  ? [MAR Hold] tamsulosin  0.4 mg Oral Daily  ? [MAR Hold] Warfarin - Pharmacist Dosing Inpatient   Does not apply [id number]  ? ?Continuous Infusions: ? sodium chloride 20 mL/hr at 04/02/22 0035  ? [MAR Hold] penicillin g continuous IV infusion 6 Million Units (04/03/22 0341)  ? ?PRN Meds: ?[MAR Hold] acetaminophen **OR** [MAR Hold] acetaminophen, [MAR Hold] guaiFENesin, [MAR Hold] levalbuterol, [MAR Hold] ondansetron **OR** [MAR Hold] ondansetron (ZOFRAN) IV, [MAR Hold] simethicone  ? ?Vital Signs  ?  ?Vitals:  ? 04/02/22 1302 04/02/22 2321 04/03/22 0441 04/03/22 0832  ?BP: 107/68 103/62 117/72 137/70  ?Pulse: 86 88 87 91  ?Resp: 20 18    ?Temp: (!) 97.4 ?F (36.3 ?C) 98.4 ?F (36.9 ?C) 98.1 ?F (36.7 ?C) (!) 97.3 ?F (36.3 ?C)  ?TempSrc: Oral Oral Oral Temporal  ?SpO2: 99% 94% 90% 96%  ?Weight:    86.2 kg  ?Height:    '5\' 4"'$  (1.626 m)  ? ? ?Intake/Output Summary (Last 24 hours) at 04/03/2022 0846 ?Last data filed at 04/03/2022 0341 ?Gross per 24 hour  ?Intake 1353.67 ml  ?Output 1250 ml  ?Net 103.67 ml  ? ? ?  04/03/2022  ?  8:32 AM 03/27/2022  ?  4:22 PM 03/27/2022  ?  9:19 AM  ?Last 3 Weights  ?Weight (lbs) 190 lb 0.6 oz 190 lb 195 lb 1.7 oz  ?Weight (kg) 86.2 kg 86.183 kg 88.5 kg  ?   ? ?Telemetry  ?  ?Regular rhythm, aflutter vs SR- Personally Reviewed ? ?ECG  ?  ?pending- Personally  Reviewed ? ?Physical Exam  ? ?GEN: No acute distress.   ?Neck: No JVD ?Cardiac: reg. Valve closure sound normal. ?Respiratory: Clear to auscultation bilaterally. ?GI: Soft, nontender, non-distended  ?MS: No edema; No deformity. ?Neuro:  Nonfocal  ?Psych: Normal affect  ? ?Labs  ?  ?High Sensitivity Troponin:   ?Recent Labs  ?Lab 03/27/22 ?4008 03/27/22 ?1415  ?TROPONINIHS 44* 67*  ?   ?Chemistry ?Recent Labs  ?Lab 03/27/22 ?6761 03/27/22 ?1300 03/28/22 ?0422 03/29/22 ?0433 03/30/22 ?9509 03/31/22 ?0409 04/03/22 ?0448  ?NA 137  --  137 135 134* 137  --   ?K 3.9  --  4.3 3.7 3.4* 3.8  --   ?CL 102  --  103 101 102 102  --   ?CO2 25  --  '26 26 27 27  '$ --   ?GLUCOSE 156*  --  104* 106* 112* 114*  --   ?BUN 30*  --  33* 36* 36* 35*  --   ?CREATININE 2.11*  --  1.93* 1.93* 1.72* 1.68* 1.85*  ?CALCIUM 9.8  --  8.9 8.9 8.6* 9.1  --   ?MG  --  1.9  --  2.2  --   --   --   ?PROT 6.5  --  5.3*  --   --   --   --   ?ALBUMIN 4.1  --  3.0*  --   --   --   --   ?AST 29  --  68*  --   --   --   --   ?ALT 18  --  32  --   --   --   --   ?ALKPHOS 66  --  42  --   --   --   --   ?BILITOT 1.3*  --  0.7  --   --   --   --   ?GFRNONAA 29*  --  32* 32* 37* 38* 34*  ?ANIONGAP 10  --  '8 8 5 8  '$ --   ?  ?Lipids No results for input(s): CHOL, TRIG, HDL, LABVLDL, LDLCALC, CHOLHDL in the last 168 hours.  ?Hematology ?Recent Labs  ?Lab 03/31/22 ?0409 04/01/22 ?0430 04/02/22 ?9242  ?WBC 6.9 8.0 9.9  ?RBC 3.54* 3.44* 3.86*  ?HGB 11.9* 11.4* 12.7*  ?HCT 35.3* 35.0* 40.1  ?MCV 99.7 101.7* 103.9*  ?MCH 33.6 33.1 32.9  ?MCHC 33.7 32.6 31.7  ?RDW 14.4 14.4 14.6  ?PLT 174 183 212  ? ?Thyroid No results for input(s): TSH, FREET4 in the last 168 hours.  ?BNP ?Recent Labs  ?Lab 03/27/22 ?0934  ?BNP 843.4*  ?  ?DDimer No results for input(s): DDIMER in the last 168 hours.  ? ?Radiology  ?  ?CT ABDOMEN PELVIS W CONTRAST ? ?Result Date: 04/01/2022 ?CLINICAL DATA:  Sepsis strep gallatycus bacteremia- rule out colonic neoplasm EXAM: CT ABDOMEN AND PELVIS WITH  CONTRAST TECHNIQUE: Multidetector CT imaging of the abdomen and pelvis was performed using the standard protocol following bolus administration of intravenous contrast. RADIATION DOSE REDUCTION: This exam was performed according to the departmental dose-optimization program which includes automated exposure control, adjustment of the mA and/or kV according to patient size and/or use of iterative reconstruction technique. CONTRAST:  8m OMNIPAQUE IOHEXOL 300 MG/ML  SOLN COMPARISON:  Abdominal ultrasound 04/13/2013 FINDINGS: Lower chest: Trace right pleural effusion and adjacent atelectasis. Mild cardiomegaly with coronary artery calcifications. Prominent right infrahilar lymph node at 12 mm partially included Hepatobiliary: No focal hepatic abnormality is seen. There is a 3.4 cm lamellated calcified gallstone in the gallbladder, adjacent high density may represent sludge or additional stones. No pericholecystic fat stranding or inflammation. Pancreas: No ductal dilatation or inflammation. Spleen: Normal in size without focal abnormality. Adrenals/Urinary Tract: Left adrenal thickening without nodule. Normal right adrenal gland. There is mild bilateral renal parenchymal thinning. No hydronephrosis. Homogeneous enhancement with symmetric excretion on delayed phase imaging. Distended but unremarkable urinary bladder. Stomach/Bowel: Mild gastric distension with ingested contents and air. There is no gastric wall thickening. No small bowel obstruction or inflammatory change. Administered enteric contrast reaches the ascending colon. The appendix is normal. Moderate volume of colonic stool. Equivocal short segment of colonic wall thickening involving the splenic flexure, series 2, image 30 and series 4, image 31. Portions of the descending and sigmoid colon are not well assessed due to nondistention as well as barium within diverticula from recent esophagram. Descending and sigmoid diverticulosis without focal  diverticulitis. No colonic inflammation. Vascular/Lymphatic: Moderate aortic and branch atherosclerosis. Focal 3.6 cm aneurysm involving the distal aorta. Aortic branch vessels are patent. Portal, splenic, and superior mesenteric veins are patent. No enlarged lymph nodes in the abdomen or pelvis.  No adenopathy in the region of possible colonic wall thickening. Reproductive: Prominent prostate gland spans 5.2 cm transverse. Other: No ascites or free air. Small fat containing umbilical hernia Musculoskeletal: Degenerative change throughout the lumbar spine. No focal bone lesion or acute osseous abnormalities IMPRESSION: 1. Equivocal short segment of colonic wall thickening involving the splenic flexure, which may be due to peristalsis, however colonic neoplasm is not excluded. Recommend up-to-date colonoscopy. 2. Cholelithiasis without CT findings of acute cholecystitis. 3. Colonic diverticulosis without focal diverticulitis. 4. Focal 3.6 cm distal abdominal aortic aneurysm. Recommend follow-up ultrasound every 2 years. This recommendation follows ACR consensus guidelines: White Paper of the ACR Incidental Findings Committee II on Vascular Findings. J Am Coll Radiol 2013; 10:789-794. 5. Trace right pleural effusion and adjacent atelectasis. Aortic Atherosclerosis (ICD10-I70.0). Electronically Signed   By: Keith Rake M.D.   On: 04/01/2022 20:40   ? ?Cardiac Studies  ? ?Echocardiogram 03/28/22  ?1. Left ventricular ejection fraction, by estimation, is 20 to 25%. Left  ?ventricular ejection fraction by 2D MOD biplane is 24.5 %. The left  ?ventricle has severely decreased function. The left ventricle demonstrates  ?global hypokinesis with inferior  ?akinesis. The left ventricular internal cavity size was moderately  ?dilated. There is mild left ventricular hypertrophy. Left ventricular  ?diastolic function could not be evaluated.  ? 2. Right ventricular systolic function is mildly reduced. The right  ?ventricular size  is normal. There is mildly elevated pulmonary artery  ?systolic pressure. The estimated right ventricular systolic pressure is  ?68.3 mmHg.  ? 3. Left atrial size was severely dilated.  ? 4. Right atrial size was mildly

## 2022-04-03 NOTE — Anesthesia Postprocedure Evaluation (Signed)
Anesthesia Post Note ? ?Patient: Juanya Villavicencio. ? ?Procedure(s) Performed: TRANSESOPHAGEAL ECHOCARDIOGRAM (TEE) ? ?  ? ?Patient location during evaluation: PACU ?Anesthesia Type: MAC ?Level of consciousness: awake and alert ?Pain management: pain level controlled ?Vital Signs Assessment: post-procedure vital signs reviewed and stable ?Respiratory status: spontaneous breathing, nonlabored ventilation and respiratory function stable ?Cardiovascular status: blood pressure returned to baseline and stable ?Postop Assessment: no apparent nausea or vomiting ?Anesthetic complications: no ? ? ?No notable events documented. ? ?Last Vitals:  ?Vitals:  ? 04/03/22 1048 04/03/22 1130  ?BP: 115/61 124/78  ?Pulse: 88 91  ?Resp: 18 18  ?Temp:  36.4 ?C  ?SpO2: 99% 97%  ?  ?Last Pain:  ?Vitals:  ? 04/03/22 1130  ?TempSrc: Oral  ?PainSc:   ? ? ?  ?  ?  ?  ?  ?  ? ?Jarome Matin Asees Manfredi ? ? ? ? ?

## 2022-04-03 NOTE — Procedures (Signed)
? ? ? ?  Transesophageal Echocardiogram Note ? ?[name] ?[id number] ?[birth year] ? ?Procedure: Transesophageal Echocardiogram ?Indications: Bacteremia; Afib ? ?Procedure Details ?Consent: Obtained ?Time Out: Verified patient identification, verified procedure, site/side was marked, verified correct patient position, special equipment/implants available, Radiology Safety Procedures followed,  medications/allergies/relevent history reviewed, required imaging and test results available.  Performed ? ?Medications: ?Propofol: '400mg'$  per CV anesthesia ? ?Left Ventrical:  LVEF ~30% with global hypkinesis ? ?Mitral Valve: A 66m St Jude Bileaflet mitral mechanical valve is present. Valve appears well seated with normal function by doppler interrogation. Mean gradient 511mg at HR in 90s. No valvular vegtations visualized ? ?Aortic Valve: A 2121mt Jude mechanical aortic valve is present. Valve appears well seated with normal function. Mean gradient 30m41m No valvular vegetations visualized ? ?Tricuspid Valve: Normal structure. Mild TR. No valvular vegetations visualized ? ?Pulmonic Valve: Normal structure. No PI visualized ? ?Left Atrium/ Left atrial appendage: Severely LA enlargement. No evidence of LAA thrombus ? ?Atrial septum: No PFO demonstrated by color flow doppler ? ?Aorta: Grade III plaque ? ? ?Complications: No apparent complications ?Patient did tolerate procedure well. ? ?HeatFreada Bergeron ?04/03/2022, 10:44 AM  ?

## 2022-04-04 ENCOUNTER — Inpatient Hospital Stay (HOSPITAL_COMMUNITY): Payer: Medicare Other

## 2022-04-04 ENCOUNTER — Encounter (HOSPITAL_COMMUNITY): Payer: Self-pay | Admitting: Cardiology

## 2022-04-04 ENCOUNTER — Inpatient Hospital Stay: Payer: Self-pay

## 2022-04-04 DIAGNOSIS — J9601 Acute respiratory failure with hypoxia: Secondary | ICD-10-CM | POA: Diagnosis not present

## 2022-04-04 DIAGNOSIS — A409 Streptococcal sepsis, unspecified: Secondary | ICD-10-CM | POA: Diagnosis not present

## 2022-04-04 DIAGNOSIS — J189 Pneumonia, unspecified organism: Secondary | ICD-10-CM

## 2022-04-04 DIAGNOSIS — R6520 Severe sepsis without septic shock: Secondary | ICD-10-CM | POA: Diagnosis not present

## 2022-04-04 LAB — PROTIME-INR
INR: 3 — ABNORMAL HIGH (ref 0.8–1.2)
Prothrombin Time: 30.6 seconds — ABNORMAL HIGH (ref 11.4–15.2)

## 2022-04-04 LAB — CBC
HCT: 37.2 % — ABNORMAL LOW (ref 39.0–52.0)
Hemoglobin: 12.2 g/dL — ABNORMAL LOW (ref 13.0–17.0)
MCH: 33.7 pg (ref 26.0–34.0)
MCHC: 32.8 g/dL (ref 30.0–36.0)
MCV: 102.8 fL — ABNORMAL HIGH (ref 80.0–100.0)
Platelets: 238 10*3/uL (ref 150–400)
RBC: 3.62 MIL/uL — ABNORMAL LOW (ref 4.22–5.81)
RDW: 14.7 % (ref 11.5–15.5)
WBC: 7.8 10*3/uL (ref 4.0–10.5)
nRBC: 0 % (ref 0.0–0.2)

## 2022-04-04 LAB — BASIC METABOLIC PANEL WITH GFR
Anion gap: 6 (ref 5–15)
BUN: 32 mg/dL — ABNORMAL HIGH (ref 8–23)
CO2: 24 mmol/L (ref 22–32)
Calcium: 9.3 mg/dL (ref 8.9–10.3)
Chloride: 106 mmol/L (ref 98–111)
Creatinine, Ser: 1.87 mg/dL — ABNORMAL HIGH (ref 0.61–1.24)
GFR, Estimated: 34 mL/min — ABNORMAL LOW
Glucose, Bld: 125 mg/dL — ABNORMAL HIGH (ref 70–99)
Potassium: 4.7 mmol/L (ref 3.5–5.1)
Sodium: 136 mmol/L (ref 135–145)

## 2022-04-04 MED ORDER — CHLORHEXIDINE GLUCONATE CLOTH 2 % EX PADS: 6.0000 | MEDICATED_PAD | Freq: Every day | CUTANEOUS | Status: DC

## 2022-04-04 MED ORDER — WARFARIN SODIUM 3 MG PO TABS
3.0000 mg | ORAL_TABLET | Freq: Every day | ORAL | 0 refills | Status: AC
Start: 2022-04-04 — End: ?

## 2022-04-04 MED ORDER — BUMETANIDE 2 MG PO TABS: 2.0000 mg | ORAL_TABLET | Freq: Every day | ORAL | 0 refills | Status: DC

## 2022-04-04 MED ORDER — WARFARIN SODIUM 3 MG PO TABS
3.0000 mg | ORAL_TABLET | Freq: Once | ORAL | Status: AC
  Administered 2022-04-04: 3 mg via ORAL
  Filled 2022-04-04: qty 1

## 2022-04-04 MED ORDER — SODIUM CHLORIDE 0.9% FLUSH: 10.0000 mL | INTRAVENOUS | Status: DC | PRN

## 2022-04-04 MED ORDER — POTASSIUM CHLORIDE CRYS ER 20 MEQ PO TBCR: 20.0000 meq | EXTENDED_RELEASE_TABLET | Freq: Every day | ORAL | 0 refills | Status: AC

## 2022-04-04 MED ORDER — PENICILLIN G POTASSIUM IV (FOR PTA / DISCHARGE USE ONLY): 12.0000 10*6.[IU] | INTRAVENOUS | 0 refills | Status: AC

## 2022-04-04 NOTE — Progress Notes (Addendum)
ANTICOAGULATION CONSULT NOTE - Follow Up Consult ? ?Pharmacy Consult for Warfarin ?Indication: Mechanical MVR/AVR, atrial flutter  ? ?Allergies  ?Allergen Reactions  ? Bee Venom Anaphylaxis  ? Ace Inhibitors Cough  ? Atorvastatin Other (See Comments)  ?  Muscle Pain  ? Pravastatin Other (See Comments)  ?  Muscle Pain  ? Ranexa [Ranolazine] Other (See Comments)  ?  Tachycardia  ? ? ?Patient Measurements: ?Height: '5\' 4"'$  (162.6 cm) ?Weight: 86.2 kg (190 lb 0.6 oz) ?IBW/kg (Calculated) : 59.2 ? ?Vital Signs: ?Temp: 97.7 ?F (36.5 ?C) (04/13 1219) ?Temp Source: Oral (04/13 1219) ?BP: 124/76 (04/13 1219) ?Pulse Rate: 89 (04/13 1219) ? ?Labs: ?Recent Labs  ?  04/02/22 ?6389 04/03/22 ?0448 04/04/22 ?0458  ?HGB 12.7*  --  12.2*  ?HCT 40.1  --  37.2*  ?PLT 212  --  238  ?LABPROT 37.9* 37.8* 30.6*  ?INR 3.9* 3.9* 3.0*  ?CREATININE  --  1.85* 1.87*  ? ? ? ?Estimated Creatinine Clearance: 25.5 mL/min (A) (by C-G formula based on SCr of 1.87 mg/dL (H)). ? ? ?Assessment: ?Patient is a 88yo male with PMH of vtach, atrial flutter, HTN, anemia, HF with EF 45%, CAD, MVR/AVR and hx of Enterococcus bacteremia who presented to ED on 4/5 with complaints of generalized weakness, n/v, and fever.  ?INR 4.6 on admission, given 2.'5mg'$  Vitamin K PO on 4/5 1112. Barium swallow negative for stricture. Pharmacy consulted for warfarin dosing. Patient given 1 dose SQ Enoxaparin for bridging while INR subtherapeutic on 4/7.  ? ?PTA warfarin regimen '3mg'$  daily except '6mg'$  on Fridays (regimen confirmed with PCP, Dr. Buel Ream office on 4/6) ? ?Today, 04/04/22: ?-INR 3, in therapeutic range after holding warfarin doses  4/10, 4/11, 4/12 ?-CBC: Hgb 12.2, low but stable. Pltc WNL.  ?-no bleeding issues reported  ?-Drug interactions: Penicillin G can increase INR. Amiodarone is home med with warfarin, so likely not contributing to changes.  ? ?Goal of Therapy:  ?INR 2.5-3.5 ?Monitor platelets by anticoagulation protocol: Yes ?  ?Plan:  ?-Give Warfarin '3mg'$  PO  x 1 today prior to discharge ?-Discussed discharge warfarin recommendations with provider ? ? ?Lindell Spar, PharmD, BCPS ?Clinical Pharmacist  ?04/04/2022 4:05 PM ? ?

## 2022-04-04 NOTE — Progress Notes (Signed)
Peripherally Inserted Central Catheter Placement ? ?The IV Nurse has discussed with the patient and/or persons authorized to consent for the patient, the purpose of this procedure and the potential benefits and risks involved with this procedure.  The benefits include less needle sticks, lab draws from the catheter, and the patient may be discharged home with the catheter. Risks include, but not limited to, infection, bleeding, blood clot (thrombus formation), and puncture of an artery; nerve damage and irregular heartbeat and possibility to perform a PICC exchange if needed/ordered by physician.  Alternatives to this procedure were also discussed.  Bard Power PICC patient education guide, fact sheet on infection prevention and patient information card has been provided to patient /or left at bedside.   ? ?PICC Placement Documentation  ?PICC Single Lumen 93/90/30 Right Basilic 39 cm 0 cm (Active)  ?Indication for Insertion or Continuance of Line Home intravenous therapies (PICC only) 04/04/22 1138  ?Exposed Catheter (cm) 0 cm 04/04/22 1138  ?Site Assessment Clean, Dry, Intact 04/04/22 1138  ?Line Status Flushed;Saline locked;Blood return noted 04/04/22 1138  ?Dressing Type Securing device;Transparent 04/04/22 1138  ?Dressing Status Antimicrobial disc in place 04/04/22 1138  ?Dressing Intervention New dressing 04/04/22 1138  ?Dressing Change Due 04/11/22 04/04/22 1138  ? ? ? ? ? ?Christella Noa Albarece ?04/04/2022, 11:39 AM ? ?

## 2022-04-04 NOTE — Care Management Important Message (Signed)
Important Message ? ?Patient Details IM Letter placed in Patients room. ?Name: Steve Riddle. ?MRN: [id number] ?Date of Birth: 11/30/1929 ? ? ?Medicare Important Message Given:  Yes ? ? ? ? ?Kerin Salen ?04/04/2022, 11:51 AM ?

## 2022-04-04 NOTE — TOC Progression Note (Signed)
Transition of Care (TOC) - Progression Note  ? ? ?Patient Details  ?Name: Steve Riddle. ?MRN: [id number] ?Date of Birth: 11/03/1929 ? ?Transition of Care (TOC) CM/SW Contact  ?Dessa Phi, RN ?Phone Number: ?04/04/2022, 10:11 AM ? ?Clinical Narrative: Pam iv infusion w/[facility] following for initial instruction;[facility] rep Ken[place] following HHRN-ongoing instruction teaching iv abx.   ? ? ? ?Expected Discharge Plan: [place] ?Barriers to Discharge: Continued Medical Work up ? ?Expected Discharge Plan and Services ?Expected Discharge Plan: [place] ?  ?Discharge Planning Services: CM Consult ?Post Acute Care Choice: Home Health ?Living arrangements for the past 2 months: [place] ?                ?  ?  ?  ?  ?  ?HH Arranged: RN, IV Antibiotics ?[place] Agency: [place] ([facility] ?Date HH Agency Contacted: 04/04/22 ?Time [place]: 7106 ?Representative spoke with at [place]: Pam-[facility] iv [place] teaching iv abx. ? ? ?Social Determinants of Health (SDOH) Interventions ?  ? ?Readmission Risk Interventions ?   ? View : No data to display.  ?  ?  ?  ? ? ?

## 2022-04-04 NOTE — Progress Notes (Addendum)
Advised bedside RN to remove PIV in R arm, same arms as PICC. RN acknowledged, pt to d/c ?

## 2022-04-04 NOTE — Progress Notes (Addendum)
? ?Progress Note ? ?Patient Name: Steve Riddle. ?Date of Encounter: 04/04/2022 ? ?[facility] Cardiologist: None  ? ?Subjective  ? ?Patient denies any palpitations, chest pain, sob. Is coughing up some mucus. Is getting a picc line placed today for penicillin G at home.  ? ?Inpatient Medications  ?  ?Scheduled Meds: ? allopurinol  400 mg Oral Daily  ? amiodarone  200 mg Oral Daily  ? bumetanide  2 mg Oral Q breakfast  ? calcitRIOL  0.5 mcg Oral Daily  ? loratadine  10 mg Oral Daily  ? mouth rinse  15 mL Mouth Rinse BID  ? mexiletine  200 mg Oral TID  ? pantoprazole  40 mg Oral Daily  ? potassium chloride SA  20 mEq Oral BID  ? tamsulosin  0.4 mg Oral Daily  ? Warfarin - Pharmacist Dosing Inpatient   Does not apply [id number]  ? ?Continuous Infusions: ? penicillin g continuous IV infusion 6 Million Units (04/04/22 0422)  ? ?PRN Meds: ?acetaminophen **OR** acetaminophen, guaiFENesin, levalbuterol, ondansetron **OR** ondansetron (ZOFRAN) IV, simethicone  ? ?Vital Signs  ?  ?Vitals:  ? 04/03/22 1130 04/03/22 1553 04/03/22 1946 04/04/22 0528  ?BP: 124/78 122/67 129/76 105/68  ?Pulse: 91 93 79 90  ?Resp: '18 20 20 18  '$ ?Temp: 97.6 ?F (36.4 ?C) (!) 97.5 ?F (36.4 ?C) 97.9 ?F (36.6 ?C) (!) 97.4 ?F (36.3 ?C)  ?TempSrc: Oral Oral Oral Oral  ?SpO2: 97% 96% 98% 98%  ?Weight:      ?Height:      ? ? ?Intake/Output Summary (Last 24 hours) at 04/04/2022 1038 ?Last data filed at 04/04/2022 0900 ?Gross per 24 hour  ?Intake 1602 ml  ?Output 675 ml  ?Net 927 ml  ? ? ?  04/03/2022  ?  8:32 AM 03/27/2022  ?  4:22 PM 03/27/2022  ?  9:19 AM  ?Last 3 Weights  ?Weight (lbs) 190 lb 0.6 oz 190 lb 195 lb 1.7 oz  ?Weight (kg) 86.2 kg 86.183 kg 88.5 kg  ?   ? ?Telemetry  ?  ?Sinu rhythm, HR 70s - Personally Reviewed ? ?ECG  ?  ?Sinus rhythm, HR 90 BPM, QT/QTcB 392/479 - Personally Reviewed ? ?Physical Exam  ? ?GEN: No acute distress.   ?Neck: No JVD ?Cardiac: RRR, Mechanical S1 and S2 clicks  ?Respiratory: Clear to auscultation bilaterally. ?GI: Soft,  nontender, non-distended  ?MS: Trace edema in BLE  ?Neuro:  Nonfocal  ?Psych: Normal affect  ? ?Labs  ?  ?High Sensitivity Troponin:   ?Recent Labs  ?Lab 03/27/22 ?7001 03/27/22 ?1415  ?TROPONINIHS 44* 67*  ?   ?Chemistry ?Recent Labs  ?Lab 03/29/22 ?0433 03/30/22 ?7494 03/31/22 ?0409 04/03/22 ?0448 04/04/22 ?0458  ?NA 135 134* 137  --  136  ?K 3.7 3.4* 3.8  --  4.7  ?CL 101 102 102  --  106  ?CO2 '26 27 27  '$ --  24  ?GLUCOSE 106* 112* 114*  --  125*  ?BUN 36* 36* 35*  --  32*  ?CREATININE 1.93* 1.72* 1.68* 1.85* 1.87*  ?CALCIUM 8.9 8.6* 9.1  --  9.3  ?MG 2.2  --   --   --   --   ?GFRNONAA 32* 37* 38* 34* 34*  ?ANIONGAP '8 5 8  '$ --  6  ?  ?Lipids No results for input(s): CHOL, TRIG, HDL, LABVLDL, LDLCALC, CHOLHDL in the last 168 hours.  ?Hematology ?Recent Labs  ?Lab 04/01/22 ?0430 04/02/22 ?4967 04/04/22 ?0458  ?WBC 8.0 9.9  7.8  ?RBC 3.44* 3.86* 3.62*  ?HGB 11.4* 12.7* 12.2*  ?HCT 35.0* 40.1 37.2*  ?MCV 101.7* 103.9* 102.8*  ?MCH 33.1 32.9 33.7  ?MCHC 32.6 31.7 32.8  ?RDW 14.4 14.6 14.7  ?PLT 183 212 238  ? ?Thyroid No results for input(s): TSH, FREET4 in the last 168 hours.  ?BNPNo results for input(s): BNP, PROBNP in the last 168 hours.  ?DDimer No results for input(s): DDIMER in the last 168 hours.  ? ?Radiology  ?  ?ECHO TEE ? ?Result Date: 04/03/2022 ?   TRANSESOPHOGEAL ECHO REPORT   Patient Name:   Steve Riddle. Date of Exam: 04/03/2022 Medical Rec #:  [id number]       Height:       64.0 in Accession #:    [id number]      Weight:       190.0 lb Date of Birth:  29-Nov-1929       BSA:          1.914 m? Patient Age:    88 years        BP:           120/77 mmHg Patient Gender: M               HR:           81 bpm. Exam Location:  Inpatient Procedure: Transesophageal Echo, 3D Echo, Cardiac Doppler and Color Doppler Indications:     Bacteremia  History:         Patient has prior history of Echocardiogram examinations, most                  recent 03/28/2022. CAD; Risk Factors:Hypertension and                  Dyslipidemia.  History of COVID myocarditis. Paroxysmal                  ventricular tachycardia on amiodarone and mexiletine. Anemia.                  Aortic Valve: 21 mm St. Jude valve is present in the aortic                  position. Procedure Date: 2005.                  Mitral Valve: 29 mm St. Jude mechanical valve valve is present                  in the mitral position. Procedure Date: 2005.  Sonographer:     Darlina Sicilian RDCS Referring Phys:  [id number] Margie Billet Diagnosing Phys: Gwyndolyn Kaufman MD PROCEDURE: After discussion of the risks and benefits of a TEE, an informed consent was obtained from the patient. TEE procedure time was 18 minutes. The transesophogeal probe was passed without difficulty through the esophogus of the patient. Imaged were obtained with the patient in a left lateral decubitus position. Sedation performed by different physician. The patient was monitored while under deep sedation. Anesthestetic sedation was provided intravenously by Anesthesiology: 39.'65mg'$  of Propofol,  '20mg'$  of Lidocaine. Image quality was good. The patient's vital signs; including heart rate, blood pressure, and oxygen saturation; remained stable throughout the procedure. The patient developed no complications during the procedure. IMPRESSIONS  1. No valvular vegetations visualized.  2. Left ventricular ejection fraction, by estimation, is 25 to 30%. The left ventricle has severely decreased function. The left ventricular internal cavity size  was moderately dilated.  3. Right ventricular systolic function is mildly reduced. The right ventricular size is normal.  4. The mitral valve has been repaired/replaced. There is a 29 mm St. Jude mechanical valve present in the mitral position. Procedure Date: 2005. Echo findings are consistent with normal structure and function of the mitral valve prosthesis. There are normal washing jets with trivial mitral regurgitation. Mean gradient 66mHg at HR 88bpm, MVA 3.2cm2 by  continuity.  5. The aortic valve has been repaired/replaced. Aortic valve regurgitation is not visualized. There is a 21 mm St. Jude valve present in the aortic position. Procedure Date: 2005. Aortic valve mean gradient measures 12.0 mmHg. Aortic valve Vmax measures  2.43 m/s. DI 0.35.  6. Left atrial size was severely dilated. No left atrial/left atrial appendage thrombus was detected.  7. Right atrial size was mildly dilated.  8. Aortic dilatation noted. There is borderline dilatation of the aortic root, measuring 38 mm. There is Moderate (Grade III) plaque. FINDINGS  Left Ventricle: Left ventricular ejection fraction, by estimation, is 25 to 30%. The left ventricle has severely decreased function. The left ventricular internal cavity size was moderately dilated. Right Ventricle: The right ventricular size is normal. No increase in right ventricular wall thickness. Right ventricular systolic function is mildly reduced. Left Atrium: Left atrial size was severely dilated. No left atrial/left atrial appendage thrombus was detected. Right Atrium: Right atrial size was mildly dilated. Pericardium: There is no evidence of pericardial effusion. Mitral Valve: MVA by VTI. The mitral valve has been repaired/replaced. There are normal washing jets present with trivial mitral regurgitation mitral valve regurgitation. There is a 29 mm St. Jude mechanical valve present in the mitral position. Procedure Date: 2005. Echo findings are consistent with normal structure and function of the mitral valve prosthesis. No evidence of mitral valve stenosis. MV peak gradient, 11.0 mmHg. The mean mitral valve gradient is 5.0 mmHg. There is no evidence of mitral valve vegetation. Tricuspid Valve: The tricuspid valve is normal in structure. Tricuspid valve regurgitation is mild. There is no evidence of tricuspid valve vegetation. Aortic Valve: DI 0.35. The aortic valve has been repaired/replaced. Aortic valve regurgitation is not visualized.  Aortic valve mean gradient measures 12.0 mmHg. Aortic valve peak gradient measures 23.6 mmHg. Aortic valve area, by VTI measures 1.72 cm?.Marland KitchenThere is a 21 mm St. Jude valve present in the aortic position. Procedure D

## 2022-04-04 NOTE — Discharge Summary (Signed)
Physician Discharge Summary  ?Steve Riddle. SWF:[id number] DOB: [birth year] DOA: 03/27/2022 ? ?PCP: [name], MD ? ?Admit date: 03/27/2022 ?Discharge date: 04/04/2022 ? ?Admitted From: home ?Disposition:  home with home health  ? ?Recommendations for Outpatient Follow-up:  ?Follow up with PCP in 1 week ?Follow up with infectious disease Dr. West Bali 4/28 ?Follow-up with cardiology Dr. Wadie Lessen 4/26 ? ?Discharge Condition: Stable ?CODE STATUS: Full code ?Diet recommendation:  ?Diet Orders (From admission, onward)  ? ?  Start     Ordered  ? 04/03/22 1133  DIET DYS 3 Room service appropriate? Yes; Fluid consistency: Thin  Diet effective now       ?Question Answer Comment  ?Room service appropriate? Yes   ?Fluid consistency: Thin   ?  ? 04/03/22 1132  ? ?  ?  ? ?  ? ?Brief/Interim Summary: ?[name] is a 88 yo male with class I obesity, gout, secondary hyperparathyroidism, paroxysmal ventricular tachycardia on amiodarone and mexiletine, history of atrial flutter, history of CTI ablation April 2022 at [facility], history of systolic heart failure with an EF of 45%, most recently diagnosed with chronic heart failure with preserved EF, hyperlipidemia, hypertension,CAD, history of COVID myocarditis, history of mechanical heart valves, history of Enterococcus bacteremia treated empirically with vancomycin presented with generalized weakness progressively worse for couple of weeks, developed nausea and vomiting after eating grits for dinner followed by fever and more episodes of nausea vomiting, so came to the ED for evaluation where he was also short of breath placed on 2 L nasal cannula.   ?  ?BCID positive for strep gallolyticus bacteremia.  ID and cardiology consulted.  Patient underwent TEE which was negative for vegetation.  Infectious disease recommended to continue penicillin G for total 6 weeks. ? ?Discharge Diagnoses:  ? ?Principal Problem: ?  Streptococcal sepsis, unspecified ([place]) ?Active Problems: ?   Hyperlipidemia ?  Hypertension ?  Class 1 obesity ?  Atrial fibrillation ([place]) ?  Chronic gouty arthritis ?  Chronic heart failure with preserved ejection fraction ([place]) ?  Coronary artery disease ?  History of colonic polyps ?  Paroxysmal ventricular tachycardia ([place]) ?  S/P MVR (mitral valve replacement) ?  S/P AVR ?  Secondary hyperparathyroidism ([place]) ?  Supratherapeutic INR ?  Elevated troponin ?  Acute respiratory failure with hypoxia ([place]) ?  Thrombocytopenia ([place]) ?  CKD (chronic kidney disease) stage 4, GFR 15-29 ml/min (HCC) ?  Streptococcal bacteremia ?  Acute bacterial endocarditis ?  Infection due to Streptococcus gallolyticus ? ? ?High grade strep gallolyticus bacteremia/Sepsis r/o Endocarditis ?previous history of enterococcal endocarditis in 2021, and with 2 mechanical valves: ?-Status post TEE 4/12 without vegetation ?-Family declined colonoscopy, GI signed off 4/11  ?-Appreciate infectious disease consultation ?-Continue penicillin G through 5/19.  PICC line placed 4/13.  Follow-up with infectious disease as outpatient in 3 weeks ?  ?Dysphagia/choking while eating ?-Esophagogram w/ no stricture,but some dysmotility, no need for EGD as per GI, speech following, on Dys 3 diet.  Continue with aspiration precaution ?  ?Chronic systolic and diastolic heart failure ?-Continue Bumex (dose of  bumen and potassium decreased from BID to QD dose)  ?  ?Paroxysmal ventricular tachycardia  ?Paroxysmal atrial fibrillation s/p ablation 03/2021 ?S/P MVR ?S/P AVR ?Previous history of endocarditis ?-Continue Coumadin (INR goal 2.5-3.5), amiodarone, mexiletine  ?-In normal sinus rhythm and did not need DCCV  ?  ?Chronic gouty arthritis ?-Continue allopurinol ?  ?Secondary hyperparathyroidism ?-Continue calcitriol ?  ?CKD stage 4 ?-Stable ?  ?  Class I Obesity ?-Estimated body mass index is 32.62 kg/m? as calculated from the following: ?  Height as of this encounter: 5\' 4"  (1.626 m). ?  Weight as of this encounter: 86.2  kg. ?  ?3.6 cm distal abdominal aortic aneurysm  ?-Follow-up with cardiology  ? ?Discharge Instructions ? ?Discharge Instructions   ? ? (HEART FAILURE PATIENTS) Call MD:  Anytime you have any of the following symptoms: 1) 3 pound weight gain in 24 hours or 5 pounds in 1 week 2) shortness of breath, with or without a dry hacking cough 3) swelling in the hands, feet or stomach 4) if you have to sleep on extra pillows at night in order to breathe.   Complete by: As directed ?  ? [facility] pharmacist to adjust dose for Vancomycin, Aminoglycosides and other anti-infective therapies as requested by physician.   Complete by: As directed ?  ? [facility] to provide Cath Flo 2mg    Complete by: As directed ?  ? Administer for PICC line occlusion and as ordered by physician for other access device issues.  ? Anaphylaxis Kit: Provided to treat any anaphylactic reaction to the medication being provided to the patient if First Dose or when requested by physician   Complete by: As directed ?  ? Epinephrine 1mg /ml vial / amp: Administer 0.3mg  (0.74ml) subcutaneously once for moderate to severe anaphylaxis, nurse to call physician and pharmacy when reaction occurs and call 911 if needed for immediate care  ? Diphenhydramine 50mg /ml IV vial: Administer 25-50mg  IV/IM PRN for first dose reaction, rash, itching, mild reaction, nurse to call physician and pharmacy when reaction occurs  ? Sodium Chloride 0.9% NS 522ml IV: Administer if needed for hypovolemic blood pressure drop or as ordered by physician after call to physician with anaphylactic reaction  ? Call MD for:  difficulty breathing, headache or visual disturbances   Complete by: As directed ?  ? Call MD for:  extreme fatigue   Complete by: As directed ?  ? Call MD for:  persistant dizziness or light-headedness   Complete by: As directed ?  ? Call MD for:  persistant nausea and vomiting   Complete by: As directed ?  ? Call MD for:  severe uncontrolled pain    Complete by: As directed ?  ? Call MD for:  temperature >100.4   Complete by: As directed ?  ? Change dressing on IV access line weekly and PRN   Complete by: As directed ?  ? Discharge instructions   Complete by: As directed ?  ? You were cared for by a hospitalist during your hospital stay. If you have any questions about your discharge medications or the care you received while you were in the hospital after you are discharged, you can call the unit and ask to speak with the hospitalist on call if the hospitalist that took care of you is not available. Once you are discharged, your primary care physician will handle any further medical issues. Please note that NO REFILLS for any discharge medications will be authorized once you are discharged, as it is imperative that you return to your primary care physician (or establish a relationship with a primary care physician if you do not have one) for your aftercare needs so that they can reassess your need for medications and monitor your lab values.  ? Flush IV access with Sodium Chloride 0.9% and Heparin 10 units/ml or 100 units/ml   Complete by: As directed ?  ?  Home infusion instructions - [facility]   Complete by: As directed ?  ? Instructions: Flush IV access with Sodium Chloride 0.9% and Heparin 10units/ml or 100units/ml  ? Change dressing on IV access line: Weekly and PRN  ? Instructions Cath Flo $Remove'2mg'QwepCSv$ : Administer for PICC Line occlusion and as ordered by physician for other access device  ? [facility] pharmacist to adjust dose for: Vancomycin, Aminoglycosides and other anti-infective therapies as requested by physician  ? Increase activity slowly   Complete by: As directed ?  ? Method of administration may be changed at the discretion of home infusion pharmacist based upon assessment of the patient and/or caregiver?s ability to self-administer the medication ordered   Complete by: As directed ?  ? No wound care   Complete by: As  directed ?  ? ?  ? ?Allergies as of 04/04/2022   ? ?   Reactions  ? Bee Venom Anaphylaxis  ? Ace Inhibitors Cough  ? Atorvastatin Other (See Comments)  ? Muscle Pain  ? Pravastatin Other (See Comments)  ? Muscle Pain

## 2022-04-04 NOTE — Progress Notes (Incomplete)
ANTICOAGULATION CONSULT NOTE - Follow Up Consult ? ?Pharmacy Consult for Warfarin ?Indication: Mechanical MVR/AVR, atrial flutter  ? ?Allergies  ?Allergen Reactions  ? Bee Venom Anaphylaxis  ? Ace Inhibitors Cough  ? Atorvastatin Other (See Comments)  ?  Muscle Pain  ? Pravastatin Other (See Comments)  ?  Muscle Pain  ? Ranexa [Ranolazine] Other (See Comments)  ?  Tachycardia  ? ? ?Patient Measurements: ?Height: '5\' 4"'$  (162.6 cm) ?Weight: 86.2 kg (190 lb 0.6 oz) ?IBW/kg (Calculated) : 59.2 ? ?Vital Signs: ?Temp: 97.7 ?F (36.5 ?C) (04/13 1219) ?Temp Source: Oral (04/13 1219) ?BP: 124/76 (04/13 1219) ?Pulse Rate: 89 (04/13 1219) ? ?Labs: ?Recent Labs  ?  04/02/22 ?7591 04/03/22 ?0448 04/04/22 ?0458  ?HGB 12.7*  --  12.2*  ?HCT 40.1  --  37.2*  ?PLT 212  --  238  ?LABPROT 37.9* 37.8* 30.6*  ?INR 3.9* 3.9* 3.0*  ?CREATININE  --  1.85* 1.87*  ? ? ? ?Estimated Creatinine Clearance: 25.5 mL/min (A) (by C-G formula based on SCr of 1.87 mg/dL (H)). ? ? ?Assessment: ?Patient is a 88yo male with PMH of vtach, atrial flutter, HTN, anemia, HF with EF 45%, CAD, MVR/AVR and hx of Enterococcus bacteremia who presented to ED on 4/5 with complaints of generalized weakness, n/v, and fever.  ?INR 4.6 on admission, given 2.'5mg'$  Vitamin K PO on 4/5 1112. Barium swallow negative for stricture. Pharmacy consulted for warfarin dosing. Patient given 1 dose SQ Enoxaparin for bridging while INR subtherapeutic on 4/7.  ? ?PTA warfarin regimen '3mg'$  daily except '6mg'$  on Fridays (regimen confirmed with PCP, Dr. Buel Ream office on 4/6) ? ?Today, 04/04/22: ?-INR 3.0, in therapeutic range after holding warfarin doses  4/10, 4/11, 4/12 ?-CBC (4/13): Hgb low 12.2, Pltc WNL ?-no bleeding issues reported  ?-Drug interactions: Penicillin G can increase INR. Amiodarone is home med with warfarin, so likely not contributing to changes.  ? ?Goal of Therapy:  ?INR 2.5-3.5 ?Monitor platelets by anticoagulation protocol: Yes ?  ?Plan:  ?-Give Warfarin '3mg'$  PO  today 1600 ?-Daily PT/INR  ?-CBC monitoring at least every week ?-Monitor for s/sx bleeding ? ? ?Lindell Spar, PharmD, BCPS ?Clinical Pharmacist  ?04/03/2022 8:08 AM ?

## 2022-04-04 NOTE — Progress Notes (Signed)
PIV in right arm removed. Discharge instructions reviewed with patient and patient family member. Patient discharged home in stable condition.  ?

## 2022-04-04 NOTE — Progress Notes (Signed)
? ?[facility] Infectious Diseases Follow Up Note ? ?Patient Identification: ?Patient Name: Steve Riddle. MRN: [id number] Admit Date: 03/27/2022  9:19 AM ?Age: 88 y.o.Today's Date: 04/04/2022 ? ?Reason for Visit: Follow up on bacteremia  ? ?Principal Problem: ?  Streptococcal sepsis, unspecified ([place]) ?Active Problems: ?  Hyperlipidemia ?  Hypertension ?  Class 1 obesity ?  Atrial fibrillation ([place]) ?  Chronic gouty arthritis ?  Chronic heart failure with preserved ejection fraction ([place]) ?  Coronary artery disease ?  History of colonic polyps ?  Paroxysmal ventricular tachycardia ([place]) ?  S/P MVR (mitral valve replacement) ?  S/P AVR ?  Secondary hyperparathyroidism ([place]) ?  Supratherapeutic INR ?  Elevated troponin ?  Acute respiratory failure with hypoxia ([place]) ?  Thrombocytopenia ([place]) ?  CKD (chronic kidney disease) stage 4, GFR 15-29 ml/min (HCC) ?  Streptococcal bacteremia ?  Acute bacterial endocarditis ?  Infection due to Streptococcus gallolyticus ? ? ?Antibiotics:  ?Azithromycin 4/5 ?Ceftriaxone 4/5-4/6, Pen G 4/7-c ?Metronidazole 4/5 ?Vancomycin 4/5 ? ?Lines/Hardwares: PIV ? ?Interval Events: continues to be afebrile, no leukocytosis, TEE with no vegetations ? ?Assessment ?# High grade strep gallolyticus bacteremia with TEE negative for PV endocarditis - Patient declined colonoscopy  ? ?# CHF rEF ?# A Fib s/p ablation  ?# s/p mechanical AVR and MVR ? TEE with new reduction in EF from 45% to 25-30% ? Bumetanide, Amiodarone, Mexiletine and Warfarin ? Cardiology following  ? ?# Acute Hypoxic Respiratory Failure with probable PNA , resolved  ? ?# AKI on CKD - Cr is stable  ? ?Recommendations ?Continue Pen G as is, plan for 6 weeks from 4/7 although TEE given presence of mechanical heart valves and unclear source  ?OK to place access for long term IV abtx ?ID pharmacy to place OPAT orders ?Monitor CBC and BMP ?Will fu in the clinic in 3 weeks. Otherwise ID  will sign off.  ? ?Diagnosis: High grade bacteremia, presumed endocarditis  ? ?Culture Result: strep gallolyticus ? ?Allergies  ?Allergen Reactions  ? Bee Venom Anaphylaxis  ? Ace Inhibitors Cough  ? Atorvastatin Other (See Comments)  ?  Muscle Pain  ? Pravastatin Other (See Comments)  ?  Muscle Pain  ? Ranexa [Ranolazine] Other (See Comments)  ?  Tachycardia  ? ? ?OPAT Orders ?Discharge antibiotics to be given via PICC line ?Discharge antibiotics: Pen G  ?Per pharmacy protocol  ?Duration: 6 weeks ?End Date: 05/09/22 ? ?[facility] Care Per Protocol: ? ?Home health RN for IV administration and teaching; PICC line care and labs.   ? ?Labs weekly while on IV antibiotics: ?X__ CBC with differential ?X__ BMP ?__ CMP ?__ CRP ?__ ESR ?__ Vancomycin trough ?__ CK ? ?X__ Please pull PIC at completion of IV antibiotics ?__ Please leave PIC in place until doctor has seen patient or been notified ? ?Fax weekly labs to [phone] ? ?Clinic Follow Up Appt: 04/19/22 with myself ? ? ?Rest of the management as per the primary team. ?Thank you for the consult. Please page with pertinent questions or concerns. ? ?______________________________________________________________________ ?Subjective ?patient seen and examined at the bedside.  ?Doing well. No complaints ?Wife at bedside.  ? ?Vitals ?BP 105/68 (BP Location: Left Arm)   Pulse 90   Temp (!) 97.4 ?F (36.3 ?C) (Oral)   Resp 18   Ht _0  (1.626 m)   Wt 86.2 kg   SpO2 98%   BMI 32.62 kg/m?  ? ?  ?Physical Exam ?Constitutional:  sitting up in  the chair and appears comfortable  ?   Comments:  ? ?Cardiovascular:  ?   Rate and Rhythm: Normal rate and regular rhythm.  ?   Heart sounds: mechanical click + ? ?Pulmonary:  ?   Effort: Pulmonary effort is normal.  ?   Comments: Bilateral clear air entry  ? ?Abdominal:  ?   Palpations: Abdomen is soft.  ?   Tenderness: non distended  ? ?Musculoskeletal:     ?   General: No swelling or tenderness.  ? ?Neurological:  ?   General: Grossly  non focal, awake, alert and oriented  ? ?Psychiatric:     ?   Mood and Affect: Mood normal.  ? ?Pertinent Microbiology ?Results for orders placed or performed during the hospital encounter of 03/27/22  ?Resp Panel by RT-PCR (Flu A&B, Covid) Nasopharyngeal Swab     Status: None  ? Collection Time: 03/27/22  9:34 AM  ? Specimen: Nasopharyngeal Swab; Nasopharyngeal(NP) swabs in vial transport medium  ?Result Value Ref Range Status  ? SARS Coronavirus 2 by RT PCR NEGATIVE NEGATIVE Final  ?  Comment: (NOTE) ?SARS-CoV-2 target nucleic acids are NOT DETECTED. ? ?The SARS-CoV-2 RNA is generally detectable in upper respiratory ?specimens during the acute phase of infection. The lowest ?concentration of SARS-CoV-2 viral copies this assay can detect is ?138 copies/mL. A negative result does not preclude SARS-Cov-2 ?infection and should not be used as the sole basis for treatment or ?other patient management decisions. A negative result may occur with  ?improper specimen collection/handling, submission of specimen other ?than nasopharyngeal swab, presence of viral mutation(s) within the ?areas targeted by this assay, and inadequate number of viral ?copies(<138 copies/mL). A negative result must be combined with ?clinical observations, patient history, and epidemiological ?information. The expected result is Negative. ? ?Fact Sheet for Patients:  ?EntrepreneurPulse.com.au ? ?Fact Sheet for Healthcare Providers:  ?IncredibleEmployment.be ? ?This test is no t yet approved or cleared by the [place] FDA and  ?has been authorized for detection and/or diagnosis of SARS-CoV-2 by ?FDA under an Emergency Use Authorization (EUA). This EUA will remain  ?in effect (meaning this test can be used) for the duration of the ?COVID-19 declaration under Section 564(b)(1) of the Act, 21 ?U.S.C.section 360bbb-3(b)(1), unless the authorization is terminated  ?or revoked sooner.  ? ? ?  ? Influenza A by PCR  NEGATIVE NEGATIVE Final  ? Influenza B by PCR NEGATIVE NEGATIVE Final  ?  Comment: (NOTE) ?The Xpert Xpress SARS-CoV-2/FLU/RSV plus assay is intended as an aid ?in the diagnosis of influenza from Nasopharyngeal swab specimens and ?should not be used as a sole basis for treatment. Nasal washings and ?aspirates are unacceptable for Xpert Xpress SARS-CoV-2/FLU/RSV ?testing. ? ?Fact Sheet for Patients: ?EntrepreneurPulse.com.au ? ?Fact Sheet for Healthcare Providers: ?IncredibleEmployment.be ? ?This test is not yet approved or cleared by the [place] FDA and ?has been authorized for detection and/or diagnosis of SARS-CoV-2 by ?FDA under an Emergency Use Authorization (EUA). This EUA will remain ?in effect (meaning this test can be used) for the duration of the ?COVID-19 declaration under Section 564(b)(1) of the Act, 21 U.S.C. ?section 360bbb-3(b)(1), unless the authorization is terminated or ?revoked. ? ?Performed at [facility], [address] [place], [place] [address] ?  ?Blood Culture (routine x 2)     Status: Abnormal  ? Collection Time: 03/27/22  9:34 AM  ? Specimen: BLOOD  ?Result Value Ref Range Status  ? Specimen Description   Final  ?  BLOOD RIGHT ANTECUBITAL ?Performed at [facility], [address] [place], [place] [address] ?  ? Special Requests   Final  ?  BOTTLES DRAWN AEROBIC AND ANAEROBIC Blood Culture adequate volume ?Performed at [facility], [address] [place], [place] [address] ?  ? Culture  Setup Time   Final  ?  GRAM POSITIVE COCCI ?IN BOTH AEROBIC AND ANAEROBIC BOTTLES ?CRITICAL RESULT CALLED TO, READ BACK BY AND VERIFIED WITH: ?PHARMD ELLEN JACKSON 03/28/22_0 :00 BY TW ?Performed at [facility], [place] [address], [place], [place] [address] ?  ? Culture STREPTOCOCCUS GALLOLYTICUS (A)  Final  ? Report Status 03/29/2022 FINAL  Final  ? Organism ID, Bacteria STREPTOCOCCUS GALLOLYTICUS  Final  ?    Susceptibility   ? Streptococcus gallolyticus - MIC*  ?  PENICILLIN 0.12 SENSITIVE Sensitive   ?  CEFTRIAXONE 0.25 SENSITIVE Sensitive   ?  ERYTHROMYCIN <=0.12 SENSITIVE Sensitive   ?  LEVOFLOXACIN 2 SENSITIVE Sensitive   ?  VANCOM

## 2022-04-04 NOTE — Progress Notes (Signed)
PHARMACY CONSULT NOTE FOR: ? ?OUTPATIENT  PARENTERAL ANTIBIOTIC THERAPY (OPAT) ? ?Indication: Strep gallolyticus bacteremia in a patient with mechanical valves  ?Regimen: Penicillin G 12 million units every 24 hours as a continuous infusion  ?End date: 05/10/22 ? ?IV antibiotic discharge orders are pended. ?To discharging provider:  please sign these orders via discharge navigator,  ?Select New Orders & click on the button choice - Manage This Unsigned Work.  ?  ? ?Thank you for allowing pharmacy to be a part of this patient's care. ? ?Jimmy Footman, PharmD, BCPS, BCIDP ?Infectious Diseases Clinical Pharmacist ?Phone: [phone] ?04/04/2022, 9:07 AM ? ?

## 2022-04-04 NOTE — TOC Transition Note (Signed)
Transition of Care (TOC) - CM/SW Discharge Note ? ? ?Patient Details  ?Name: Steve Riddle. ?MRN: [id number] ?Date of Birth: [birth year] ? ?Transition of Care [facility]) CM/SW Contact:  ?Dessa Phi, RN ?Phone Number: ?04/04/2022, 12:11 PM ? ? ?Clinical Narrative: d/c home. [facility] iv home infusion rep Pam initial teaching;order med/supplies;[facility] HHRN rep Kenzie ongoing teaching. No further CM needs.   ? ? ? ?Final next level of care: [place] ?Barriers to Discharge: No Barriers Identified ? ? ?Patient Goals and CMS Choice ?Patient states their goals for this hospitalization and ongoing recovery are:: Home ?CMS Medicare.gov Compare Post Acute Care list provided to:: Patient Represenative (must comment) (spouse [name] [phone]) ?Choice offered to / list presented to : Spouse ? ?Discharge Placement ?  ?           ?  ?  ?  ?  ? ?Discharge Plan and Services ?  ?Discharge Planning Services: CM Consult ?Post Acute Care Choice: Home Health          ?  ?  ?  ?  ?  ?HH Arranged: RN, IV Antibiotics ?[place] Agency: [place] ([facility]), [facility] ?Date HH Agency Contacted: 04/04/22 ?Time [place]: 6553 ?Representative spoke with at [place]: Pam-[facility] iv infusion;[facility] HHRN teaching iv abx. ? ?Social Determinants of Health (SDOH) Interventions ?  ? ? ?Readmission Risk Interventions ?   ? View : No data to display.  ?  ?  ?  ? ? ? ? ? ?

## 2022-04-05 DIAGNOSIS — D631 Anemia in chronic kidney disease: Secondary | ICD-10-CM | POA: Diagnosis not present

## 2022-04-05 DIAGNOSIS — Z5181 Encounter for therapeutic drug level monitoring: Secondary | ICD-10-CM | POA: Diagnosis not present

## 2022-04-05 DIAGNOSIS — I480 Paroxysmal atrial fibrillation: Secondary | ICD-10-CM | POA: Diagnosis not present

## 2022-04-05 DIAGNOSIS — I130 Hypertensive heart and chronic kidney disease with heart failure and stage 1 through stage 4 chronic kidney disease, or unspecified chronic kidney disease: Secondary | ICD-10-CM | POA: Diagnosis not present

## 2022-04-05 DIAGNOSIS — I471 Supraventricular tachycardia: Secondary | ICD-10-CM | POA: Diagnosis not present

## 2022-04-05 DIAGNOSIS — Z952 Presence of prosthetic heart valve: Secondary | ICD-10-CM | POA: Diagnosis not present

## 2022-04-05 DIAGNOSIS — I714 Abdominal aortic aneurysm, without rupture, unspecified: Secondary | ICD-10-CM | POA: Diagnosis not present

## 2022-04-05 DIAGNOSIS — E669 Obesity, unspecified: Secondary | ICD-10-CM | POA: Diagnosis not present

## 2022-04-05 DIAGNOSIS — Z6832 Body mass index (BMI) 32.0-32.9, adult: Secondary | ICD-10-CM | POA: Diagnosis not present

## 2022-04-05 DIAGNOSIS — A408 Other streptococcal sepsis: Secondary | ICD-10-CM | POA: Diagnosis not present

## 2022-04-05 DIAGNOSIS — I5042 Chronic combined systolic (congestive) and diastolic (congestive) heart failure: Secondary | ICD-10-CM | POA: Diagnosis not present

## 2022-04-05 DIAGNOSIS — Z792 Long term (current) use of antibiotics: Secondary | ICD-10-CM | POA: Diagnosis not present

## 2022-04-05 DIAGNOSIS — D696 Thrombocytopenia, unspecified: Secondary | ICD-10-CM | POA: Diagnosis not present

## 2022-04-05 DIAGNOSIS — Z7901 Long term (current) use of anticoagulants: Secondary | ICD-10-CM | POA: Diagnosis not present

## 2022-04-05 DIAGNOSIS — N184 Chronic kidney disease, stage 4 (severe): Secondary | ICD-10-CM | POA: Diagnosis not present

## 2022-04-05 DIAGNOSIS — E785 Hyperlipidemia, unspecified: Secondary | ICD-10-CM | POA: Diagnosis not present

## 2022-04-05 DIAGNOSIS — N2581 Secondary hyperparathyroidism of renal origin: Secondary | ICD-10-CM | POA: Diagnosis not present

## 2022-04-05 DIAGNOSIS — M1990 Unspecified osteoarthritis, unspecified site: Secondary | ICD-10-CM | POA: Diagnosis not present

## 2022-04-05 DIAGNOSIS — M1A30X0 Chronic gout due to renal impairment, unspecified site, without tophus (tophi): Secondary | ICD-10-CM | POA: Diagnosis not present

## 2022-04-05 DIAGNOSIS — R1310 Dysphagia, unspecified: Secondary | ICD-10-CM | POA: Diagnosis not present

## 2022-04-05 DIAGNOSIS — I2510 Atherosclerotic heart disease of native coronary artery without angina pectoris: Secondary | ICD-10-CM | POA: Diagnosis not present

## 2022-04-05 DIAGNOSIS — Z452 Encounter for adjustment and management of vascular access device: Secondary | ICD-10-CM | POA: Diagnosis not present

## 2022-04-05 DIAGNOSIS — K5790 Diverticulosis of intestine, part unspecified, without perforation or abscess without bleeding: Secondary | ICD-10-CM | POA: Diagnosis not present

## 2022-04-08 DIAGNOSIS — I130 Hypertensive heart and chronic kidney disease with heart failure and stage 1 through stage 4 chronic kidney disease, or unspecified chronic kidney disease: Secondary | ICD-10-CM | POA: Diagnosis not present

## 2022-04-08 DIAGNOSIS — A409 Streptococcal sepsis, unspecified: Secondary | ICD-10-CM | POA: Diagnosis not present

## 2022-04-08 DIAGNOSIS — A408 Other streptococcal sepsis: Secondary | ICD-10-CM | POA: Diagnosis not present

## 2022-04-08 DIAGNOSIS — N184 Chronic kidney disease, stage 4 (severe): Secondary | ICD-10-CM | POA: Diagnosis not present

## 2022-04-08 DIAGNOSIS — N2581 Secondary hyperparathyroidism of renal origin: Secondary | ICD-10-CM | POA: Diagnosis not present

## 2022-04-08 DIAGNOSIS — D631 Anemia in chronic kidney disease: Secondary | ICD-10-CM | POA: Diagnosis not present

## 2022-04-08 DIAGNOSIS — I5042 Chronic combined systolic (congestive) and diastolic (congestive) heart failure: Secondary | ICD-10-CM | POA: Diagnosis not present

## 2022-04-15 DIAGNOSIS — I5042 Chronic combined systolic (congestive) and diastolic (congestive) heart failure: Secondary | ICD-10-CM | POA: Diagnosis not present

## 2022-04-15 DIAGNOSIS — D631 Anemia in chronic kidney disease: Secondary | ICD-10-CM | POA: Diagnosis not present

## 2022-04-15 DIAGNOSIS — A409 Streptococcal sepsis, unspecified: Secondary | ICD-10-CM | POA: Diagnosis not present

## 2022-04-15 DIAGNOSIS — I130 Hypertensive heart and chronic kidney disease with heart failure and stage 1 through stage 4 chronic kidney disease, or unspecified chronic kidney disease: Secondary | ICD-10-CM | POA: Diagnosis not present

## 2022-04-15 DIAGNOSIS — N184 Chronic kidney disease, stage 4 (severe): Secondary | ICD-10-CM | POA: Diagnosis not present

## 2022-04-15 DIAGNOSIS — A408 Other streptococcal sepsis: Secondary | ICD-10-CM | POA: Diagnosis not present

## 2022-04-15 DIAGNOSIS — N2581 Secondary hyperparathyroidism of renal origin: Secondary | ICD-10-CM | POA: Diagnosis not present

## 2022-04-17 DIAGNOSIS — R9431 Abnormal electrocardiogram [ECG] [EKG]: Secondary | ICD-10-CM | POA: Diagnosis not present

## 2022-04-17 DIAGNOSIS — Z952 Presence of prosthetic heart valve: Secondary | ICD-10-CM | POA: Diagnosis not present

## 2022-04-17 DIAGNOSIS — I420 Dilated cardiomyopathy: Secondary | ICD-10-CM | POA: Diagnosis not present

## 2022-04-17 DIAGNOSIS — Z7901 Long term (current) use of anticoagulants: Secondary | ICD-10-CM | POA: Diagnosis not present

## 2022-04-17 DIAGNOSIS — I4819 Other persistent atrial fibrillation: Secondary | ICD-10-CM | POA: Diagnosis not present

## 2022-04-17 DIAGNOSIS — I484 Atypical atrial flutter: Secondary | ICD-10-CM | POA: Diagnosis not present

## 2022-04-17 DIAGNOSIS — I2510 Atherosclerotic heart disease of native coronary artery without angina pectoris: Secondary | ICD-10-CM | POA: Diagnosis not present

## 2022-04-17 DIAGNOSIS — I4729 Other ventricular tachycardia: Secondary | ICD-10-CM | POA: Diagnosis not present

## 2022-04-17 DIAGNOSIS — Z8679 Personal history of other diseases of the circulatory system: Secondary | ICD-10-CM | POA: Diagnosis not present

## 2022-04-17 DIAGNOSIS — E7800 Pure hypercholesterolemia, unspecified: Secondary | ICD-10-CM | POA: Diagnosis not present

## 2022-04-17 DIAGNOSIS — I10 Essential (primary) hypertension: Secondary | ICD-10-CM | POA: Diagnosis not present

## 2022-04-19 ENCOUNTER — Other Ambulatory Visit: Payer: Self-pay

## 2022-04-19 ENCOUNTER — Ambulatory Visit (INDEPENDENT_AMBULATORY_CARE_PROVIDER_SITE_OTHER): Payer: Medicare Other | Admitting: Infectious Diseases

## 2022-04-19 VITALS — BP 143/87 | HR 95 | Resp 16 | Ht 64.0 in | Wt 188.0 lb

## 2022-04-19 DIAGNOSIS — A491 Streptococcal infection, unspecified site: Secondary | ICD-10-CM

## 2022-04-19 DIAGNOSIS — R7881 Bacteremia: Secondary | ICD-10-CM

## 2022-04-19 DIAGNOSIS — Z452 Encounter for adjustment and management of vascular access device: Secondary | ICD-10-CM | POA: Diagnosis not present

## 2022-04-19 DIAGNOSIS — Z5181 Encounter for therapeutic drug level monitoring: Secondary | ICD-10-CM | POA: Diagnosis not present

## 2022-04-19 DIAGNOSIS — B955 Unspecified streptococcus as the cause of diseases classified elsewhere: Secondary | ICD-10-CM | POA: Diagnosis not present

## 2022-04-19 NOTE — Progress Notes (Addendum)
? ?   ? ?Patient Active Problem List  ? Diagnosis Date Noted  ? 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 Thrombocytopenia ([place]) 03/28/2022  ? CKD (chronic kidney disease) stage 4, GFR 15-29 ml/min (HCC) 03/28/2022  ? Streptococcal bacteremia   ? Acute bacterial endocarditis   ? Streptococcal sepsis, unspecified ([place]) 03/27/2022  ? Hyperlipidemia 03/27/2022  ? Hypertension 03/27/2022  ? Class 1 obesity 03/27/2022  ? Gout 03/27/2022  ? Benign prostatic hyperplasia with lower urinary tract symptoms 03/27/2022  ? COVID-19 03/27/2022  ? History of colonic polyps 03/27/2022  ? Supratherapeutic INR 03/27/2022  ? Elevated troponin 03/27/2022  ? Acute respiratory failure with hypoxia ([place]) 03/27/2022  ? Paroxysmal ventricular tachycardia ([place]) 08/23/2020  ? Chronic kidney disease due to hypertension 03/21/2020  ? Chronic heart failure with preserved ejection fraction ([place]) 01/19/2020  ? S/P MVR (mitral valve replacement) 01/19/2020  ? S/P AVR 01/19/2020  ? Actinic keratosis 05/12/2015  ? Black stools 04/01/2014  ? GI bleed 04/01/2014  ? Occult blood in stools 08/11/2012  ? Chronic anticoagulation 08/11/2012  ? Atrial fibrillation ([place]) 10/03/2011  ? Chronic gouty arthritis 10/03/2011  ? Coronary artery disease 10/03/2011  ? Herpes simplex viral infection 05/24/2010  ? Mitral valve disorder 01/18/2010  ? Acute and subacute bacterial endocarditis 11/07/2009  ? Secondary hyperparathyroidism ([place]) 11/07/2009  ? ? ?Patient's Medications  ?New Prescriptions  ? No medications on file  ?Previous Medications  ? ACETAMINOPHEN (TYLENOL) 500 MG TABLET    Take 500 mg by mouth See admin instructions. 2- ?3 times a day  ? ALLOPURINOL (ZYLOPRIM) 100 MG TABLET    Take 400 mg by mouth daily.  ? AMIODARONE (PACERONE) 200 MG TABLET    Take 200 mg by mouth daily.  ? BUMETANIDE (BUMEX) 2 MG TABLET    Take 1 tablet (2 mg total) by mouth daily with breakfast.  ? CALCITRIOL (ROCALTROL) 0.5 MCG CAPSULE    Take 0.5 mcg by mouth every other  day.  ? CALCIUM CARBONATE (TUMS - DOSED IN MG ELEMENTAL CALCIUM) 500 MG CHEWABLE TABLET    Chew 1-2 tablets by mouth as needed for indigestion or heartburn.  ? CETIRIZINE (ZYRTEC) 10 MG TABLET    Take 10 mg by mouth at bedtime.  ? DICLOFENAC SODIUM (VOLTAREN) 1 % GEL    Apply 2 g topically 4 (four) times daily as needed (to painful sites).  ? DIPHENHYDRAMINE (BENADRYL) 25 MG TABLET    Take 25 mg by mouth at bedtime as needed (FOR BEE STING-RELATED ALLERGIC REACTIONS).  ? GLUCOSAMINE SULFATE 1000 MG TABS    Take 1 tablet by mouth every evening.  ? METOPROLOL SUCCINATE (TOPROL-XL) 25 MG 24 HR TABLET    Take 12.5 mg by mouth. Taking 1/2 tablet of a 25 mg to equal 12.5 mg daily  ? MEXILETINE (MEXITIL) 200 MG CAPSULE    Take 200 mg by mouth in the morning, at noon, and at bedtime.  ? MULTIPLE VITAMINS-MINERALS (CENTRUM SILVER 50+MEN) TABS    Take 1 tablet by mouth daily with breakfast.  ? OMEPRAZOLE (PRILOSEC) 20 MG CAPSULE    Take 1 capsule (20 mg total) by mouth daily.  ? PENICILLIN G IVPB    Inject 12 Million Units into the vein daily. As a continuous infusion. Indication:  Strep gallolyticus bacteremia in a patient with 2 mechanical valves  ?First Dose: Yes ?Last Day of Therapy:  05/10/22 ?Labs - Once weekly:  CBC/D and BMP, ?Labs - Every other week:  ESR  and CRP ?Method of administration: Elastomeric (Continuous infusion) ?Method of administration may be changed at the discretion of home infusion pharmacist based upon assessment of the patient and/or caregiver's ability to self-administer the medication ordered.  ? POTASSIUM CHLORIDE SA (KLOR-CON M) 20 MEQ TABLET    Take 1 tablet (20 mEq total) by mouth daily.  ? ROLAIDS 550-110 MG CHEW    Chew 1-2 tablets by mouth as needed (for heartburn).  ? SIMETHICONE (MYLICON) 774 MG CHEWABLE TABLET    Chew 125 mg by mouth every 6 (six) hours as needed for flatulence.  ? TAMSULOSIN (FLOMAX) 0.4 MG CAPS CAPSULE    Take 0.8 mg by mouth at bedtime.  ? WARFARIN (COUMADIN) 3 MG  TABLET    Take 1 tablet (3 mg total) by mouth daily at 4 PM. Follow up with repeat INR check 4/17.  ?Modified Medications  ? No medications on file  ?Discontinued Medications  ? GLUCOSAMINE-CHONDROITIN 500-400 MG TABLET    Take 1 tablet by mouth daily with lunch.  ? PENICILLIN G POTASSIUM [id number] UNITS INJECTION      ? ? ?Subjective: ?Here for HFU for strep gallolyticuc bacteremia of unclear source in the setting of presence of mechanical AV and MV. Accompanied by wife. Getting Penicillin G through RT arm PICC without any issues. Denies nausea, vomiting, rashes or diarrhea. Doing well. Feels stronger every day. Saw cardiologist at [facility] 4/26 and will be transferring care to [place].  ? ?Review of Systems: ?All systems reviewed with pertinent positives and negatives as listed above ? ?Past Medical History:  ?Diagnosis Date  ? Anemia   ? Arthritis   ? Diverticulosis   ? Endocarditis   ? GI bleed   ? Gout   ? Heart murmur   ? Hx of adenomatous colonic polyps   ? Hyperlipemia   ? Hyperparathyroidism ([place])   ? Hypertension   ? Nephrosclerosis   ? ? ?Social History  ? ?Tobacco Use  ? Smoking status: Former  ? Smokeless tobacco: Never  ?Substance Use Topics  ? Alcohol use: No  ? Drug use: No  ? ? ?Family History  ?Problem Relation Age of Onset  ? Hypertension Mother   ? Stroke Mother   ? Hypertension Father   ? Breast cancer Daughter   ? Uterine cancer Daughter   ? Liver cancer Brother 85  ? ? ?Allergies  ?Allergen Reactions  ? Bee Venom Anaphylaxis  ? Ace Inhibitors Cough  ? Atorvastatin Other (See Comments)  ?  Muscle Pain  ? Pravastatin Other (See Comments)  ?  Muscle Pain  ? Ranexa [Ranolazine] Other (See Comments)  ?  Tachycardia  ? ? ?Health Maintenance  ?Topic Date Due  ? URINE MICROALBUMIN  Never done  ? Zoster Vaccines- Shingrix (1 of 2) Never done  ? Pneumonia Vaccine 79+ Years old (1 - PCV) Never done  ? COVID-19 Vaccine (3 - Pfizer risk series) 03/27/2020  ? INFLUENZA VACCINE  07/23/2022  ? TETANUS/TDAP  07/10/2023   ? HPV VACCINES  Aged Out  ? ? ?Objective: ?BP (!) 143/87   Pulse 95   Resp 16   Ht $R'5\' 4"'KY$  (1.626 m)   Wt 188 lb (85.3 kg)   SpO2 98%   BMI 32.27 kg/m?  ? ? ?Physical Exam ?Constitutional:   ?   Appearance: Elderly male sitting up in the bed  ?HENT:  ?   Head: Normocephalic and atraumatic.   ?   Mouth: Mucous membranes are moist.  ?Eyes: ?  Conjunctiva/sclera: Conjunctivae normal.  ?   Pupils:  ? ?Cardiovascular:  ?   Rate and Rhythm: Normal rate , mechanical heart sounds  ?   Heart sounds: ? ?Pulmonary:  ?   Effort: Pulmonary effort is normal.  ?   Breath sounds: Normal breath sounds.  ? ?Abdominal:  ?   General: Non distended  ?   Palpations: soft.  ? ?Musculoskeletal:     ?   General: Normal range of motion.  ? ?Skin: ?   General: Skin is warm and dry.  ?   Comments: ? ?Neurological:  ?   General: grossly non focal  ?   Mental Status: awake, alert and oriented to person, place, and time.  ? ?Psychiatric:     ?   Mood and Affect: Mood normal.  ? ?Lab Results ?Lab Results  ?Component Value Date  ? WBC 7.8 04/04/2022  ? HGB 12.2 (L) 04/04/2022  ? HCT 37.2 (L) 04/04/2022  ? MCV 102.8 (H) 04/04/2022  ? PLT 238 04/04/2022  ?  ?Lab Results  ?Component Value Date  ? CREATININE 1.87 (H) 04/04/2022  ? BUN 32 (H) 04/04/2022  ? NA 136 04/04/2022  ? K 4.7 04/04/2022  ? CL 106 04/04/2022  ? CO2 24 04/04/2022  ?  ?Lab Results  ?Component Value Date  ? ALT 32 03/28/2022  ? AST 68 (H) 03/28/2022  ? ALKPHOS 42 03/28/2022  ? BILITOT 0.7 03/28/2022  ?  ?No results found for: CHOL, HDL, LDLCALC, LDLDIRECT, TRIG, CHOLHDL ?No results found for: LABRPR, RPRTITER ?No results found for: HIV1RNAQUANT, HIV1RNAVL, CD4TABS ?  ?Problem List Items Addressed This Visit   ? ?  ? Other  ? Streptococcal bacteremia  ? 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 Medication monitoring encounter  ? PICC (peripherally inserted central catheter) in place  ? ?Assessment ?# High grade strep gallolyticus bacteremia with TEE negative for  PV endocarditis - Patient declined colonoscopy  ?- Continue Pen G for 6 weeks. EOT 5/18 ?- Fu in 3 weeks ?  ?# CHF rEF ?# A Fib s/p ablation  ?# s/p mechanical AVR and MVR ? TEE with new reduction in EF from 45% t

## 2022-04-21 DIAGNOSIS — E785 Hyperlipidemia, unspecified: Secondary | ICD-10-CM | POA: Diagnosis not present

## 2022-04-21 DIAGNOSIS — I10 Essential (primary) hypertension: Secondary | ICD-10-CM | POA: Diagnosis not present

## 2022-04-21 DIAGNOSIS — I129 Hypertensive chronic kidney disease with stage 1 through stage 4 chronic kidney disease, or unspecified chronic kidney disease: Secondary | ICD-10-CM | POA: Diagnosis not present

## 2022-04-22 DIAGNOSIS — N184 Chronic kidney disease, stage 4 (severe): Secondary | ICD-10-CM | POA: Diagnosis not present

## 2022-04-22 DIAGNOSIS — I130 Hypertensive heart and chronic kidney disease with heart failure and stage 1 through stage 4 chronic kidney disease, or unspecified chronic kidney disease: Secondary | ICD-10-CM | POA: Diagnosis not present

## 2022-04-22 DIAGNOSIS — I5042 Chronic combined systolic (congestive) and diastolic (congestive) heart failure: Secondary | ICD-10-CM | POA: Diagnosis not present

## 2022-04-22 DIAGNOSIS — N2581 Secondary hyperparathyroidism of renal origin: Secondary | ICD-10-CM | POA: Diagnosis not present

## 2022-04-22 DIAGNOSIS — A408 Other streptococcal sepsis: Secondary | ICD-10-CM | POA: Diagnosis not present

## 2022-04-22 DIAGNOSIS — D631 Anemia in chronic kidney disease: Secondary | ICD-10-CM | POA: Diagnosis not present

## 2022-04-26 ENCOUNTER — Emergency Department (HOSPITAL_COMMUNITY): Payer: Medicare Other

## 2022-04-26 ENCOUNTER — Emergency Department (HOSPITAL_COMMUNITY)
Admission: EM | Admit: 2022-04-26 | Discharge: 2022-04-26 | Disposition: A | Discharge status: Home / Self Care | Payer: Medicare Other | Attending: Emergency Medicine | Admitting: Emergency Medicine

## 2022-04-26 ENCOUNTER — Encounter (HOSPITAL_COMMUNITY): Payer: Self-pay

## 2022-04-26 ENCOUNTER — Other Ambulatory Visit: Payer: Self-pay

## 2022-04-26 DIAGNOSIS — I5042 Chronic combined systolic (congestive) and diastolic (congestive) heart failure: Secondary | ICD-10-CM | POA: Diagnosis not present

## 2022-04-26 DIAGNOSIS — Z79899 Other long term (current) drug therapy: Secondary | ICD-10-CM | POA: Insufficient documentation

## 2022-04-26 DIAGNOSIS — R000 Tachycardia, unspecified: Secondary | ICD-10-CM | POA: Diagnosis not present

## 2022-04-26 DIAGNOSIS — I130 Hypertensive heart and chronic kidney disease with heart failure and stage 1 through stage 4 chronic kidney disease, or unspecified chronic kidney disease: Secondary | ICD-10-CM | POA: Diagnosis not present

## 2022-04-26 DIAGNOSIS — R791 Abnormal coagulation profile: Secondary | ICD-10-CM | POA: Insufficient documentation

## 2022-04-26 DIAGNOSIS — R944 Abnormal results of kidney function studies: Secondary | ICD-10-CM | POA: Diagnosis not present

## 2022-04-26 DIAGNOSIS — I5023 Acute on chronic systolic (congestive) heart failure: Secondary | ICD-10-CM | POA: Diagnosis not present

## 2022-04-26 DIAGNOSIS — N2581 Secondary hyperparathyroidism of renal origin: Secondary | ICD-10-CM | POA: Diagnosis not present

## 2022-04-26 DIAGNOSIS — N184 Chronic kidney disease, stage 4 (severe): Secondary | ICD-10-CM | POA: Diagnosis not present

## 2022-04-26 DIAGNOSIS — D631 Anemia in chronic kidney disease: Secondary | ICD-10-CM | POA: Diagnosis not present

## 2022-04-26 DIAGNOSIS — A408 Other streptococcal sepsis: Secondary | ICD-10-CM | POA: Diagnosis not present

## 2022-04-26 DIAGNOSIS — I509 Heart failure, unspecified: Secondary | ICD-10-CM | POA: Diagnosis not present

## 2022-04-26 DIAGNOSIS — E8809 Other disorders of plasma-protein metabolism, not elsewhere classified: Secondary | ICD-10-CM | POA: Diagnosis not present

## 2022-04-26 DIAGNOSIS — R7309 Other abnormal glucose: Secondary | ICD-10-CM | POA: Insufficient documentation

## 2022-04-26 DIAGNOSIS — R0602 Shortness of breath: Secondary | ICD-10-CM | POA: Diagnosis not present

## 2022-04-26 DIAGNOSIS — I110 Hypertensive heart disease with heart failure: Secondary | ICD-10-CM | POA: Diagnosis not present

## 2022-04-26 HISTORY — DX: Unspecified atrial fibrillation: I48.91

## 2022-04-26 HISTORY — DX: Sepsis, unspecified organism: A41.9

## 2022-04-26 LAB — COMPREHENSIVE METABOLIC PANEL
ALT: 17 U/L (ref 0–44)
AST: 29 U/L (ref 15–41)
Albumin: 3.4 g/dL — ABNORMAL LOW (ref 3.5–5.0)
Alkaline Phosphatase: 66 U/L (ref 38–126)
Anion gap: 7 (ref 5–15)
BUN: 30 mg/dL — ABNORMAL HIGH (ref 8–23)
CO2: 27 mmol/L (ref 22–32)
Calcium: 9.9 mg/dL (ref 8.9–10.3)
Chloride: 108 mmol/L (ref 98–111)
Creatinine, Ser: 2.2 mg/dL — ABNORMAL HIGH (ref 0.61–1.24)
GFR, Estimated: 28 mL/min — ABNORMAL LOW (ref >60)
Glucose, Bld: 118 mg/dL — ABNORMAL HIGH (ref 70–99)
Potassium: 4 mmol/L (ref 3.5–5.1)
Sodium: 142 mmol/L (ref 135–145)
Total Bilirubin: 0.8 mg/dL (ref 0.3–1.2)
Total Protein: 7.4 g/dL (ref 6.5–8.1)

## 2022-04-26 LAB — CBC WITH DIFFERENTIAL/PLATELET
Abs Immature Granulocytes: 0.01 10*3/uL (ref 0.00–0.07)
Basophils Absolute: 0 10*3/uL (ref 0.0–0.1)
Basophils Relative: 0 %
Eosinophils Absolute: 0.1 10*3/uL (ref 0.0–0.5)
Eosinophils Relative: 1 %
HCT: 39.7 % (ref 39.0–52.0)
Hemoglobin: 13.2 g/dL (ref 13.0–17.0)
Immature Granulocytes: 0 %
Lymphocytes Relative: 11 %
Lymphs Abs: 0.6 10*3/uL — ABNORMAL LOW (ref 0.7–4.0)
MCH: 34.2 pg — ABNORMAL HIGH (ref 26.0–34.0)
MCHC: 33.2 g/dL (ref 30.0–36.0)
MCV: 102.8 fL — ABNORMAL HIGH (ref 80.0–100.0)
Monocytes Absolute: 0.5 10*3/uL (ref 0.1–1.0)
Monocytes Relative: 8 %
Neutro Abs: 4.8 10*3/uL (ref 1.7–7.7)
Neutrophils Relative %: 80 %
Platelets: 182 10*3/uL (ref 150–400)
RBC: 3.86 MIL/uL — ABNORMAL LOW (ref 4.22–5.81)
RDW: 15.5 % (ref 11.5–15.5)
WBC: 6 10*3/uL (ref 4.0–10.5)
nRBC: 0 % (ref 0.0–0.2)

## 2022-04-26 LAB — PROTIME-INR
INR: 3.3 — ABNORMAL HIGH (ref 0.8–1.2)
Prothrombin Time: 33.3 seconds — ABNORMAL HIGH (ref 11.4–15.2)

## 2022-04-26 LAB — BRAIN NATRIURETIC PEPTIDE: B Natriuretic Peptide: 375.7 pg/mL — ABNORMAL HIGH (ref 0.0–100.0)

## 2022-04-26 LAB — TROPONIN I (HIGH SENSITIVITY): Troponin I (High Sensitivity): 33 ng/L — ABNORMAL HIGH (ref <18)

## 2022-04-26 NOTE — Discharge Instructions (Signed)
Continue taking the metoprolol 25 mg a day.  Monitor your heart rate and weight and keep a log of it.  Call your cardiologist on Monday to see about scheduling a sooner follow-up appointment. ?

## 2022-04-26 NOTE — ED Provider Notes (Addendum)
[facility]-EMERGENCY DEPT Provider Note   CSN: [id number] Arrival date & time: 04/26/22  1214     History  Chief Complaint  Patient presents with   Shortness of Breath   Atrial Fibrillation    Steve Struss. is a 88 y.o. male.  HPI Patient presents for evaluation of periods of fast heartbeat.  Today he doubled up on his metoprolol from 12-1/2 to 25 mg because of the elevated heart rates.  He has noticed this in the last few days.  He has occasionally had some periods of low heart rate.  Last week his cardiologist at [facility] increase his Bumex from 2 to 4 mg a day and started him on metoprolol 12.5 mg a day.  He has lost 10 pounds since increasing the Bumex, 1 week ago; and his lower leg edema has resolved.  Patient has otherwise been doing well, with eating and drinking.  He has mild shortness of breath occasionally.  He is not having any chest pain, focal weakness or paresthesia.  He has been chronically treated for streptococcal bacteremia, with penicillin G, daily dosed at home through PICC line.  End of treatment scheduled for 05/09/2022.  He has a history of CHF ( 20 to 25%) with reduced ejection fraction, atrial fibrillation, mechanical aortic and mitral valves.    Home Medications Prior to Admission medications   Medication Sig Start Date End Date Taking? Authorizing Provider  acetaminophen (TYLENOL) 500 MG tablet Take 500 mg by mouth See admin instructions. 2- 3 times a day    [provider]  allopurinol (ZYLOPRIM) 100 MG tablet Take 400 mg by mouth daily.    [provider]  amiodarone (PACERONE) 200 MG tablet Take 200 mg by mouth daily.    [provider]  bumetanide (BUMEX) 2 MG tablet Take 1 tablet (2 mg total) by mouth daily with breakfast. Patient taking differently: Take 2 mg by mouth 2 (two) times daily. 04/05/22   Noralee Stain, DO  calcitRIOL (ROCALTROL) 0.5 MCG capsule Take 0.5 mcg by mouth every other day.  08/10/12   [provider]  calcium carbonate (TUMS - DOSED IN MG ELEMENTAL CALCIUM) 500 MG chewable tablet Chew 1-2 tablets by mouth as needed for indigestion or heartburn.    [provider]  cetirizine (ZYRTEC) 10 MG tablet Take 10 mg by mouth at bedtime.    [provider]  diclofenac Sodium (VOLTAREN) 1 % GEL Apply 2 g topically 4 (four) times daily as needed (to painful sites).    [provider]  diphenhydrAMINE (BENADRYL) 25 MG tablet Take 25 mg by mouth at bedtime as needed (FOR BEE STING-RELATED ALLERGIC REACTIONS).    [provider]  Glucosamine Sulfate 1000 MG TABS Take 1 tablet by mouth every evening.    [provider]  metoprolol succinate (TOPROL-XL) 25 MG 24 hr tablet Take 12.5 mg by mouth. Taking 1/2 tablet of a 25 mg to equal 12.5 mg daily 04/17/22   [provider]  mexiletine (MEXITIL) 200 MG capsule Take 200 mg by mouth in the morning, at noon, and at bedtime.    [provider]  Multiple Vitamins-Minerals (CENTRUM SILVER 50+MEN) TABS Take 1 tablet by mouth daily with breakfast.    [provider]  omeprazole (PRILOSEC) 20 MG capsule Take 1 capsule (20 mg total) by mouth daily. 06/20/15   Hart Carwin, MD  penicillin G IVPB Inject 12 Million Units into the vein daily. As a continuous  infusion. Indication:  Strep gallolyticus bacteremia in a patient with 2 mechanical valves  First Dose: Yes Last Day of Therapy:  05/10/22 Labs - Once weekly:  CBC/D and BMP, Labs - Every other week:  ESR and CRP Method of administration: Elastomeric (Continuous infusion) Method of administration may be changed at the discretion of home infusion pharmacist based upon assessment of the patient and/or caregiver's ability to self-administer the medication ordered. 04/04/22 05/10/22  Noralee Stain, DO  potassium chloride SA (KLOR-CON M) 20 MEQ tablet Take 1 tablet (20 mEq total) by mouth daily. Patient taking differently:  Take 20 mEq by mouth 2 (two) times daily. 04/04/22   Noralee Stain, DO  ROLAIDS 550-110 MG CHEW Chew 1-2 tablets by mouth as needed (for heartburn).    [provider]  simethicone (MYLICON) 125 MG chewable tablet Chew 125 mg by mouth every 6 (six) hours as needed for flatulence.    [provider]  tamsulosin (FLOMAX) 0.4 MG CAPS capsule Take 0.8 mg by mouth at bedtime.    [provider]  warfarin (COUMADIN) 3 MG tablet Take 1 tablet (3 mg total) by mouth daily at 4 PM. Follow up with repeat INR check 4/17. 04/04/22   Noralee Stain, DO      Allergies    Bee venom, Ace inhibitors, Atorvastatin, Pravastatin, and Ranexa [ranolazine]    Review of Systems   Review of Systems  Physical Exam Updated Vital Signs BP (!) 116/96   Pulse (!) 102   Temp 97.6 F (36.4 C) (Oral)   Resp 14   Ht 5\' 4"  (1.626 m)   Wt 81.6 kg   SpO2 96%   BMI 30.90 kg/m  Physical Exam Vitals and nursing note reviewed.  Constitutional:      General: He is not in acute distress.    Appearance: He is well-developed. He is not ill-appearing, toxic-appearing or diaphoretic.  HENT:     Head: Normocephalic and atraumatic.     Right Ear: External ear normal.     Left Ear: External ear normal.  Eyes:     Conjunctiva/sclera: Conjunctivae normal.     Pupils: Pupils are equal, round, and reactive to light.  Neck:     Trachea: Phonation normal.  Cardiovascular:     Rate and Rhythm: Regular rhythm. Tachycardia present.     Heart sounds: Normal heart sounds.     Comments: Low-grade tachycardia, 103 Pulmonary:     Effort: Pulmonary effort is normal. No respiratory distress.     Breath sounds: Normal breath sounds. No stridor.  Abdominal:     General: There is no distension.     Palpations: Abdomen is soft.     Tenderness: There is no abdominal tenderness.  Musculoskeletal:        General: Normal range of motion.     Cervical back: Normal range of motion and neck supple.     Right lower  leg: No edema.     Left lower leg: No edema.  Skin:    General: Skin is warm and dry.  Neurological:     Mental Status: He is alert and oriented to person, place, and time.     Cranial Nerves: No cranial nerve deficit.     Sensory: No sensory deficit.     Motor: No abnormal muscle tone.     Coordination: Coordination normal.  Psychiatric:        Mood and Affect: Mood normal.        Behavior: Behavior normal.  Thought Content: Thought content normal.        Judgment: Judgment normal.    ED Results / Procedures / Treatments   Labs (all labs ordered are listed, but only abnormal results are displayed) Labs Reviewed  BRAIN NATRIURETIC PEPTIDE - Abnormal; Notable for the following components:      Result Value   B Natriuretic Peptide 375.7 (*)    All other components within normal limits  CBC WITH DIFFERENTIAL/PLATELET - Abnormal; Notable for the following components:   RBC 3.86 (*)    MCV 102.8 (*)    MCH 34.2 (*)    Lymphs Abs 0.6 (*)    All other components within normal limits  PROTIME-INR - Abnormal; Notable for the following components:   Prothrombin Time 33.3 (*)    INR 3.3 (*)    All other components within normal limits  COMPREHENSIVE METABOLIC PANEL - Abnormal; Notable for the following components:   Glucose, Bld 118 (*)    BUN 30 (*)    Creatinine, Ser 2.20 (*)    Albumin 3.4 (*)    GFR, Estimated 28 (*)    All other components within normal limits  TROPONIN I (HIGH SENSITIVITY) - Abnormal; Notable for the following components:   Troponin I (High Sensitivity) 33 (*)    All other components within normal limits    EKG EKG Interpretation  Date/Time:  Friday Apr 26 2022 13:29:17 EDT Ventricular Rate:  108 PR Interval:  197 QRS Duration: 117 QT Interval:  350 QTC Calculation: 470 R Axis:   53 Text Interpretation: Sinus tachycardia Nonspecific intraventricular conduction delay Borderline T abnormalities, diffuse leads Minimal ST elevation, inferior  leads since last tracing no significant change Confirmed by Mancel Bale [phone]) on 04/26/2022 4:18:36 PM  Radiology DG Chest 2 View  Result Date: 04/26/2022 CLINICAL DATA:  Short of breath EXAM: CHEST - 2 VIEW COMPARISON:  04/04/2022 FINDINGS: Right PICC line remains present. No new consolidation or edema. No pleural effusion. Stable cardiomediastinal contours. No acute osseous abnormality. IMPRESSION: No acute process in the chest. Electronically Signed   By: Guadlupe Spanish M.D.   On: 04/26/2022 13:13    Procedures Procedures    Medications Ordered in ED Medications - No data to display  ED Course/ Medical Decision Making/ A&P                           Medical Decision Making Patient presenting with periods of tachycardia, for which she has taken increased dose of metoprolol today.  He was recently put on higher dose of Bumex to treat fluid overload and has had improvement in his weight since then.  He has mild shortness of breath that is not persistent at this time.  He has a history of known reduced ejection fraction heart failure.  He is being treated with prolonged course of penicillin G for septicemia.  Problems Addressed: Chronic congestive heart failure, unspecified heart failure type [facility]): chronic illness or injury Tachycardia: acute illness or injury  Amount and/or Complexity of Data Reviewed Independent Historian:     Details: He is a cogent historian External Data Reviewed: notes.    Details: Outpatient ID clinic notes, and notes from cardiology at [facility]. Labs:     Details: CBC, metabolic panel, BNP, PT/INR, troponin-normal except BNP high, MCV high, PT/INR high, glucose high, BUN high, creatinine high, albumin low, GFR low, troponin high Radiology: ordered and independent interpretation performed.    Details: Chest  x-ray-no infiltrate or edema ECG/medicine tests: ordered and independent interpretation performed.    Details: Cardiac monitor-low-grade  tachycardia  Risk Decision regarding hospitalization. Risk Details: Patient presenting with primarily tachycardia following increasing Bumex last week for weight gain.  His weight has improved since that time.  He took an increased dose of metoprolol today and appears to have had some mild improvement in his tachycardia from 120 to 103 bpm.  Patient appears stable from cardiovascular perspective.  Laboratory testing well abnormal, appears to be generally stable.  Doubt ACS, acute renal failure, or significant metabolic disorders.  Advised patient to continue taking metoprolol 25 mg a day, and follow-up with his cardiologist for further evaluation treatment as soon as possible.  I have sent her a message regarding his condition today.  Patient does not require hospitalization at this time.           Final Clinical Impression(s) / ED Diagnoses Final diagnoses:  Tachycardia  Chronic congestive heart failure, unspecified heart failure type [facility])    Rx / DC Orders ED Discharge Orders     None         Mancel Bale, MD 04/26/22 1617    Mancel Bale, MD 04/26/22 [phone]

## 2022-04-26 NOTE — ED Triage Notes (Signed)
Patient reports increased SOB and reports his HR ranges from 70-120. Patient has a history of atrial fib. ?Patient also reports that he has more swelling in feet and ankles in the past 2-3 days. Patient states his cardiologist increased his Bumex to 4 mg /day and feels like it is some better. ? ?Patient arrived with a right arm PICC line and is receiving Penicillin G for sepsis. ?

## 2022-04-26 NOTE — ED Provider Triage Note (Signed)
Emergency Medicine Provider Triage Evaluation Note ? ?Steve Mins. , a 88 y.o. male  was evaluated in triage.  Pt complains of shortness of breath.  Patient has a history of atrial fibrillation.  Heart rates been between 70 and 120.  Over the past several days he has noticed increased leg swelling and shortness of breath.  Cardiology increased his Bumex however he remains short of breath with peripheral edema.  Patient is anticoagulated on Coumadin.  Most recent INR was 3.5.  He has a PICC line with pen G for strep septicemia ? ?Review of Systems  ?Positive: sob ?Negative: cp ? ?Physical Exam  ?BP 133/76 (BP Location: Left Arm)   Pulse (!) 114   Temp 97.6 ?F (36.4 ?C) (Oral)   Resp 16   Ht '5\' 4"'$  (1.626 m)   Wt 81.6 kg   SpO2 99%   BMI 30.90 kg/m?  ?Gen:   Awake, no distress   ?Resp:  Increased effort, rales in the bases ?MSK:   Moves extremities without difficulty  ?Other:  Pitting bl edema ? ?Medical Decision Making  ?Medically screening exam initiated at 12:43 PM.  Appropriate orders placed.  Steve Mins. was informed that the remainder of the evaluation will be completed by another provider, this initial triage assessment does not replace that evaluation, and the importance of remaining in the ED until their evaluation is complete. ? ?Work up inititated ?  ?Margarita Mail, PA-C ?04/26/22 1245 ? ?

## 2022-04-29 DIAGNOSIS — I130 Hypertensive heart and chronic kidney disease with heart failure and stage 1 through stage 4 chronic kidney disease, or unspecified chronic kidney disease: Secondary | ICD-10-CM | POA: Diagnosis not present

## 2022-04-29 DIAGNOSIS — D631 Anemia in chronic kidney disease: Secondary | ICD-10-CM | POA: Diagnosis not present

## 2022-04-29 DIAGNOSIS — N2581 Secondary hyperparathyroidism of renal origin: Secondary | ICD-10-CM | POA: Diagnosis not present

## 2022-04-29 DIAGNOSIS — A408 Other streptococcal sepsis: Secondary | ICD-10-CM | POA: Diagnosis not present

## 2022-04-29 DIAGNOSIS — A409 Streptococcal sepsis, unspecified: Secondary | ICD-10-CM | POA: Diagnosis not present

## 2022-04-29 DIAGNOSIS — I5042 Chronic combined systolic (congestive) and diastolic (congestive) heart failure: Secondary | ICD-10-CM | POA: Diagnosis not present

## 2022-04-29 DIAGNOSIS — N184 Chronic kidney disease, stage 4 (severe): Secondary | ICD-10-CM | POA: Diagnosis not present

## 2022-05-05 DIAGNOSIS — Z952 Presence of prosthetic heart valve: Secondary | ICD-10-CM | POA: Diagnosis not present

## 2022-05-05 DIAGNOSIS — A408 Other streptococcal sepsis: Secondary | ICD-10-CM | POA: Diagnosis not present

## 2022-05-05 DIAGNOSIS — Z452 Encounter for adjustment and management of vascular access device: Secondary | ICD-10-CM | POA: Diagnosis not present

## 2022-05-05 DIAGNOSIS — M1990 Unspecified osteoarthritis, unspecified site: Secondary | ICD-10-CM | POA: Diagnosis not present

## 2022-05-05 DIAGNOSIS — N184 Chronic kidney disease, stage 4 (severe): Secondary | ICD-10-CM | POA: Diagnosis not present

## 2022-05-05 DIAGNOSIS — I5042 Chronic combined systolic (congestive) and diastolic (congestive) heart failure: Secondary | ICD-10-CM | POA: Diagnosis not present

## 2022-05-05 DIAGNOSIS — I480 Paroxysmal atrial fibrillation: Secondary | ICD-10-CM | POA: Diagnosis not present

## 2022-05-05 DIAGNOSIS — N2581 Secondary hyperparathyroidism of renal origin: Secondary | ICD-10-CM | POA: Diagnosis not present

## 2022-05-05 DIAGNOSIS — Z6832 Body mass index (BMI) 32.0-32.9, adult: Secondary | ICD-10-CM | POA: Diagnosis not present

## 2022-05-05 DIAGNOSIS — D631 Anemia in chronic kidney disease: Secondary | ICD-10-CM | POA: Diagnosis not present

## 2022-05-05 DIAGNOSIS — D696 Thrombocytopenia, unspecified: Secondary | ICD-10-CM | POA: Diagnosis not present

## 2022-05-05 DIAGNOSIS — E669 Obesity, unspecified: Secondary | ICD-10-CM | POA: Diagnosis not present

## 2022-05-05 DIAGNOSIS — M1A30X0 Chronic gout due to renal impairment, unspecified site, without tophus (tophi): Secondary | ICD-10-CM | POA: Diagnosis not present

## 2022-05-05 DIAGNOSIS — E785 Hyperlipidemia, unspecified: Secondary | ICD-10-CM | POA: Diagnosis not present

## 2022-05-05 DIAGNOSIS — I714 Abdominal aortic aneurysm, without rupture, unspecified: Secondary | ICD-10-CM | POA: Diagnosis not present

## 2022-05-05 DIAGNOSIS — I471 Supraventricular tachycardia: Secondary | ICD-10-CM | POA: Diagnosis not present

## 2022-05-05 DIAGNOSIS — I2510 Atherosclerotic heart disease of native coronary artery without angina pectoris: Secondary | ICD-10-CM | POA: Diagnosis not present

## 2022-05-05 DIAGNOSIS — Z5181 Encounter for therapeutic drug level monitoring: Secondary | ICD-10-CM | POA: Diagnosis not present

## 2022-05-05 DIAGNOSIS — K5790 Diverticulosis of intestine, part unspecified, without perforation or abscess without bleeding: Secondary | ICD-10-CM | POA: Diagnosis not present

## 2022-05-05 DIAGNOSIS — R1310 Dysphagia, unspecified: Secondary | ICD-10-CM | POA: Diagnosis not present

## 2022-05-05 DIAGNOSIS — Z7901 Long term (current) use of anticoagulants: Secondary | ICD-10-CM | POA: Diagnosis not present

## 2022-05-05 DIAGNOSIS — I130 Hypertensive heart and chronic kidney disease with heart failure and stage 1 through stage 4 chronic kidney disease, or unspecified chronic kidney disease: Secondary | ICD-10-CM | POA: Diagnosis not present

## 2022-05-05 DIAGNOSIS — Z792 Long term (current) use of antibiotics: Secondary | ICD-10-CM | POA: Diagnosis not present

## 2022-05-06 DIAGNOSIS — A409 Streptococcal sepsis, unspecified: Secondary | ICD-10-CM | POA: Diagnosis not present

## 2022-05-06 DIAGNOSIS — N184 Chronic kidney disease, stage 4 (severe): Secondary | ICD-10-CM | POA: Diagnosis not present

## 2022-05-06 DIAGNOSIS — D631 Anemia in chronic kidney disease: Secondary | ICD-10-CM | POA: Diagnosis not present

## 2022-05-06 DIAGNOSIS — A408 Other streptococcal sepsis: Secondary | ICD-10-CM | POA: Diagnosis not present

## 2022-05-06 DIAGNOSIS — I130 Hypertensive heart and chronic kidney disease with heart failure and stage 1 through stage 4 chronic kidney disease, or unspecified chronic kidney disease: Secondary | ICD-10-CM | POA: Diagnosis not present

## 2022-05-06 DIAGNOSIS — N2581 Secondary hyperparathyroidism of renal origin: Secondary | ICD-10-CM | POA: Diagnosis not present

## 2022-05-06 DIAGNOSIS — I5042 Chronic combined systolic (congestive) and diastolic (congestive) heart failure: Secondary | ICD-10-CM | POA: Diagnosis not present

## 2022-05-10 ENCOUNTER — Encounter: Payer: Self-pay | Admitting: Internal Medicine

## 2022-05-10 ENCOUNTER — Ambulatory Visit (INDEPENDENT_AMBULATORY_CARE_PROVIDER_SITE_OTHER): Payer: Medicare Other | Admitting: Internal Medicine

## 2022-05-10 VITALS — BP 110/70 | HR 95 | Ht 64.0 in | Wt 184.4 lb

## 2022-05-10 DIAGNOSIS — I330 Acute and subacute infective endocarditis: Secondary | ICD-10-CM | POA: Diagnosis not present

## 2022-05-10 DIAGNOSIS — D631 Anemia in chronic kidney disease: Secondary | ICD-10-CM | POA: Diagnosis not present

## 2022-05-10 DIAGNOSIS — I480 Paroxysmal atrial fibrillation: Secondary | ICD-10-CM | POA: Diagnosis not present

## 2022-05-10 DIAGNOSIS — Z79899 Other long term (current) drug therapy: Secondary | ICD-10-CM

## 2022-05-10 DIAGNOSIS — E785 Hyperlipidemia, unspecified: Secondary | ICD-10-CM | POA: Diagnosis not present

## 2022-05-10 DIAGNOSIS — N184 Chronic kidney disease, stage 4 (severe): Secondary | ICD-10-CM | POA: Diagnosis not present

## 2022-05-10 DIAGNOSIS — A408 Other streptococcal sepsis: Secondary | ICD-10-CM | POA: Diagnosis not present

## 2022-05-10 DIAGNOSIS — D6869 Other thrombophilia: Secondary | ICD-10-CM | POA: Diagnosis not present

## 2022-05-10 DIAGNOSIS — I2510 Atherosclerotic heart disease of native coronary artery without angina pectoris: Secondary | ICD-10-CM

## 2022-05-10 DIAGNOSIS — I4729 Other ventricular tachycardia: Secondary | ICD-10-CM

## 2022-05-10 DIAGNOSIS — I130 Hypertensive heart and chronic kidney disease with heart failure and stage 1 through stage 4 chronic kidney disease, or unspecified chronic kidney disease: Secondary | ICD-10-CM | POA: Diagnosis not present

## 2022-05-10 DIAGNOSIS — I5020 Unspecified systolic (congestive) heart failure: Secondary | ICD-10-CM | POA: Diagnosis not present

## 2022-05-10 DIAGNOSIS — Z952 Presence of prosthetic heart valve: Secondary | ICD-10-CM | POA: Diagnosis not present

## 2022-05-10 DIAGNOSIS — N2581 Secondary hyperparathyroidism of renal origin: Secondary | ICD-10-CM | POA: Diagnosis not present

## 2022-05-10 DIAGNOSIS — I10 Essential (primary) hypertension: Secondary | ICD-10-CM | POA: Diagnosis not present

## 2022-05-10 DIAGNOSIS — I5042 Chronic combined systolic (congestive) and diastolic (congestive) heart failure: Secondary | ICD-10-CM | POA: Diagnosis not present

## 2022-05-10 MED ORDER — METOPROLOL SUCCINATE ER 25 MG PO TB24: 25.0000 mg | ORAL_TABLET | Freq: Every day | ORAL | 3 refills | Status: DC

## 2022-05-10 NOTE — Progress Notes (Signed)
Cardiology Office Note:    Date:  05/10/2022  ID:  [name], DOB 04/26/1929, MRN [id number]  PCP:  [name], MD  Cardiologist:  Elouise Munroe, MD  Electrophysiologist:  None   Referring MD: [name], MD   Chief Complaint/Reason for Referral: Establish care; Mechanical AVR, MVR  History of Present Illness:    Steve Riddle. is a 88 y.o. male followed by Dr. Wadie Lessen with [facility] who presents to transfer care locally.  I initially met patient in hospital during admission for bacteremia with need to evaluate for endocarditis. Patient presented to the ED on 03/27/22 complaining of nausea, vomiting, and weakness that started the night prior. Patient also had a fever with a max temp 101. Patient was admitted to the hospitalist service for treatment of suspected sepsis, acute respiratory failure with hypoxia. Infectious disease consulted, found patient to have high grade strep gallolyticus bacteremia.   He has a history of atrial flutter with CTI ablation 03/2021 at [facility]. Initial ECG in hospital most consistent with atrial fibrillation with CVR. During hospitalization, patient appeared to convert to sinus rhythm. TEE was pursued for evaluation of endocarditis, and decision was made not to perform cardioversion given likely sinus rhythm. He remained in SR at his office visit with Dr. Wadie Lessen after hospitalization, and appears to be in SR today. He continues on amiodarone 200 mg daily.   In 2012, patient developed atrial fibrillation after he had surgery to repair a right groin AV fistula. Required cardioversion in 04/2019 and 11/2019. Holter monitor from 03/2020 showed no VT, but patient presented to the hospital on 09/25/20 with symptomatic VT, thought to be related to his active COVID-19 infection. Stabilized on procainamide, amiodarone, and beta blocker. He has been continued on mexiletine. Repeat monitor 09/2020 that showed 2-1 atrial flutter, sinus bradycardia.  Continued to have symptomatic atrial flutter, had a successful cardioversion in November 2021. However, patient was found to be back in atrial flutter in February 2022 and patient underwent catheter ablation in April 2022. Due to patient's bradycardia, considered a pacemaker but ultimately decided to avoid due to prior endocarditis and mechanical valves.   History includes AVR and MVR with mechanical 21 mm St. Jude aortic valve and a mechanical 29 mm St. Jude mitral valve placed in 06/07/2004 due to endocarditis and is chronically on warfarin. Patient developed e.faecalis bacteremia in June 2021. TEE on May 26 2020 showed no evidence of vegetations, abscess, valvular regurgitation per report. Patient was treated empirically for mechanical valve endocarditis with a 6 week course of antibiotics. TEE during hospitalization 03/2022 showed no endocarditis.  He has a known history of CAD and his most recent LHC was completed on 07/20/2008. Showed 30% disease in proximal RCA, normal Lcx, 40% disease in mid LAD.   Patient has a known history of chronic HFrEF felt to be nonischemic, echocardiogram on 08/23/20 showed EF 35%, moderate LVH, mild RV systolic dysfunction. Given his reduced EF and his history of VT, ICD considered but deferred due to high risk of infection. More recently, echocardiogram in 01/10/2021 showed EF 45-50%, moderate LVH, mild RV systolic dysfunction performed at [facility]. Recent echo 03/28/22 showed significant drop in EF to 20-25%. We suspected this may be secondary to atrial fibrillation or acute illness and planned to recheck as outpatient. This was done with Dr. Wadie Lessen and showed EF 35%, calculated EF 32%.   Finally, history also includes HLD, HTN, secondary hyperparathyroidism.   Today: At recent visit with Dr. Wadie Lessen,  bumex increased to home dose of 2 mg BID. Metoprolol succinate initiated, with intended dose of 12.5 mg, and increase to 25 mg given recent ED visit for tachypalpitations. Intended  also for continued management of HFrEF. Follow up planned to eventually add losartan if tolerated. Will be difficult to titrate HF therapy in 88 yo. Fortunately he tells me he is asymptomatic today. His wife joins him for the visit today.   Past Medical History:  Diagnosis Date   Anemia    Arthritis    Atrial fibrillation (HCC)    Diverticulosis    Endocarditis    GI bleed    Gout    Heart murmur    Hx of adenomatous colonic polyps    Hyperlipemia    Hyperparathyroidism ([place])    Hypertension    Nephrosclerosis    Sepsis ([place])     Past Surgical History:  Procedure Laterality Date   AORTIC AND MITRAL VALVE REPLACEMENT     CATARACT EXTRACTION Left    with lens implant   CORONARY ARTERY BYPASS GRAFT     MITRAL VALVE REPLACEMENT     ORIF ARM     TEE WITHOUT CARDIOVERSION N/A 04/03/2022   Procedure: TRANSESOPHAGEAL ECHOCARDIOGRAM (TEE);  Surgeon: Freada Bergeron, MD;  Location: [facility] ENDOSCOPY;  Service: Cardiovascular;  Laterality: N/A;    Current Medications: Current Meds  Medication Sig   acetaminophen (TYLENOL) 500 MG tablet Take 500 mg by mouth See admin instructions. 2- 3 times a day   allopurinol (ZYLOPRIM) 100 MG tablet Take 400 mg by mouth daily.   amiodarone (PACERONE) 200 MG tablet Take 200 mg by mouth daily.   bumetanide (BUMEX) 2 MG tablet Take 1 tablet (2 mg total) by mouth daily with breakfast. (Patient taking differently: Take 2 mg by mouth 2 (two) times daily.)   calcitRIOL (ROCALTROL) 0.5 MCG capsule Take 0.5 mcg by mouth every other day.   calcium carbonate (TUMS - DOSED IN MG ELEMENTAL CALCIUM) 500 MG chewable tablet Chew 1-2 tablets by mouth as needed for indigestion or heartburn.   cetirizine (ZYRTEC) 10 MG tablet Take 10 mg by mouth at bedtime.   diclofenac Sodium (VOLTAREN) 1 % GEL Apply 2 g topically 4 (four) times daily as needed (to painful sites).   diphenhydrAMINE (BENADRYL) 25 MG tablet Take 25 mg by mouth at bedtime as needed (FOR BEE  STING-RELATED ALLERGIC REACTIONS).   Glucosamine Sulfate 1000 MG TABS Take 1 tablet by mouth every evening.   mexiletine (MEXITIL) 200 MG capsule Take 200 mg by mouth in the morning, at noon, and at bedtime.   Multiple Vitamins-Minerals (CENTRUM SILVER 50+MEN) TABS Take 1 tablet by mouth daily with breakfast.   omeprazole (PRILOSEC) 20 MG capsule Take 1 capsule (20 mg total) by mouth daily.   potassium chloride SA (KLOR-CON M) 20 MEQ tablet Take 1 tablet (20 mEq total) by mouth daily. (Patient taking differently: Take 20 mEq by mouth 2 (two) times daily.)   ROLAIDS 550-110 MG CHEW Chew 1-2 tablets by mouth as needed (for heartburn).   simethicone (MYLICON) 902 MG chewable tablet Chew 125 mg by mouth every 6 (six) hours as needed for flatulence.   tamsulosin (FLOMAX) 0.4 MG CAPS capsule Take 0.8 mg by mouth at bedtime.   warfarin (COUMADIN) 3 MG tablet Take 1 tablet (3 mg total) by mouth daily at 4 PM. Follow up with repeat INR check 4/17.   [DISCONTINUED] metoprolol succinate (TOPROL-XL) 25 MG 24 hr tablet Take 12.5 mg by mouth.  Taking 1/2 tablet of a 25 mg to equal 12.5 mg daily     Allergies:   Bee venom, Ace inhibitors, Atorvastatin, Pravastatin, and Ranexa [ranolazine]   Social History   Tobacco Use   Smoking status: Former   Smokeless tobacco: Never  Scientific laboratory technician Use: Never used  Substance Use Topics   Alcohol use: No   Drug use: No     Family History: The patient's family history includes Breast cancer in his daughter; Hypertension in his father and mother; Liver cancer (age of onset: 37) in his brother; Stroke in his mother; Uterine cancer in his daughter.  ROS:   Please see the history of present illness.    All other systems reviewed and are negative.  EKGs/Labs/Other Studies Reviewed:    The following studies were reviewed today:  EKG:  NSR, inferior infarct pattern  Imaging studies that I have independently reviewed today: n/a  Recent Labs: 03/29/2022:  Magnesium 2.2 04/26/2022: ALT 17; B Natriuretic Peptide 375.7; BUN 30; Creatinine, Ser 2.20; Hemoglobin 13.2; Platelets 182; Potassium 4.0; Sodium 142  Recent Lipid Panel No results found for: CHOL, TRIG, HDL, CHOLHDL, VLDL, LDLCALC, LDLDIRECT  Physical Exam:    VS:  BP 110/70   Pulse 95   Ht $R'5\' 4"'Zu$  (1.626 m)   Wt 184 lb 6.4 oz (83.6 kg)   SpO2 96%   BMI 31.65 kg/m     Wt Readings from Last 5 Encounters:  05/13/22 183 lb (83 kg)  05/10/22 184 lb 6.4 oz (83.6 kg)  04/26/22 180 lb (81.6 kg)  04/19/22 188 lb (85.3 kg)  04/03/22 190 lb 0.6 oz (86.2 kg)    Constitutional: No acute distress Eyes: sclera non-icteric, normal conjunctiva and lids ENMT: normal dentition, moist mucous membranes Cardiovascular: regular rhythm, normal rate, no murmurs. Mechanical S1 and S2, unchanged from last exam. No jugular venous distention.  Respiratory: clear to auscultation bilaterally GI : normal bowel sounds, soft and nontender. No distention.   MSK: extremities warm, well perfused. No edema.  NEURO: grossly nonfocal exam, moves all extremities. PSYCH: alert and oriented x 3, normal mood and affect.   ASSESSMENT:    1. Paroxysmal atrial fibrillation (HCC)   2. Secondary hypercoagulable state ([place])   3. Long term current use of antiarrhythmic drug   4. Paroxysmal ventricular tachycardia ([place])   5. Acute bacterial endocarditis   6. Hyperlipidemia, unspecified hyperlipidemia type   7. Primary hypertension   8. Coronary artery disease involving native coronary artery of native heart without angina pectoris   9. S/P MVR (mitral valve replacement)   10. S/P AVR   11. HFrEF (heart failure with reduced ejection fraction) (HCC)    PLAN:    Paroxysmal atrial fibrillation ([place]) - Plan: EKG 12-Lead Secondary hypercoagulable state ([place]) Long term current use of antiarrhythmic drug Paroxysmal ventricular tachycardia ([place]) - Plan: Ambulatory referral to Cardiac Electrophysiology - patient follows with  EP at [facility] and is transferring care locally. Will plan to refer to EP for drug management, particularly of mexiletine which was started for VT after presumed covid myocarditis.  - continues on amiodarone for atrial fibrillation, continue.  - continue warfarin anticoagulation. - recent episode of symptomatic tachycardia for which he was seen in the ER, likely afib RVR. This occurred after increasing bumex to home dose of 2 mg BID. Metoprolol succinate was increased to 25 mg daily - continue.   HFrEF - EF approximately 35% on Echo at [facility]. Would repeat in 3-6 mo  to reassess after recovery from infection and maintenance of SR. - BP borderline for med titration. - previously described as nonischemic CM, last coronary assessment appears to be 2009.  - follow up is planned with [facility] in about 2-3 mo, med titration is planned.  - in the absence of chest pain or DOE, would recommend medical management, agree with addition of BB  - not much room to add additional therapy due to BP and renal failure.  Acute bacterial endocarditis - following with ID, just completed 6 weeks of penicillin G yesterday. PICC line out.  Hyperlipidemia, unspecified hyperlipidemia type - Total 189, LDL 118, HDL 32, Trig 196. Not currently on lipid lowering therapy. Recommend diet and lifestyle modification and can consider addition of statin if patient interested for nonobstructive CAD and secondary prevention.   Primary hypertension - BP normal, would not add additional therapy for BP at this time. On metoprolol succinate.   Coronary artery disease involving native coronary artery of native heart without angina pectoris - mild and nonobstructive.  S/P MVR (mitral valve replacement) S/P AVR -warfarin anticoagulation, monitored by primary doctor. Review of EMR suggests prior GI bleed.  - normal function on recent TEE.   Total time of encounter: 40 minutes total time of encounter, including 30 minutes spent in face-to-face  patient care on the date of this encounter. This time includes coordination of care and counseling regarding above mentioned problem list. Remainder of non-face-to-face time involved reviewing chart documents/testing relevant to the patient encounter and documentation in the medical record. I have independently reviewed documentation from referring provider.   Cherlynn Kaiser, MD, [place]   Shared Decision Making/Informed Consent:       Medication Adjustments/Labs and Tests Ordered: Current medicines are reviewed at length with the patient today.  Concerns regarding medicines are outlined above.   Orders Placed This Encounter  Procedures   Ambulatory referral to Cardiac Electrophysiology   EKG 12-Lead    Meds ordered this encounter  Medications   metoprolol succinate (TOPROL-XL) 25 MG 24 hr tablet    Sig: Take 1 tablet (25 mg total) by mouth daily.    Dispense:  90 tablet    Refill:  3    Patient Instructions  Medication Instructions:  No Changes In Medications at this time.  *If you need a refill on your cardiac medications before your next appointment, please call your pharmacy*  Lab Work: None Ordered At This Time.  If you have labs (blood work) drawn today and your tests are completely normal, you will receive your results only by: [place] (if you have MyChart) OR A paper copy in the mail If you have any lab test that is abnormal or we need to change your treatment, we will call you to review the results.  Testing/Procedures: None Ordered At This Time.   Follow-Up: At [facility], you and your health needs are our priority.  As part of our continuing mission to provide you with exceptional heart care, we have created designated Provider Care Teams.  These Care Teams include your primary Cardiologist (physician) and Advanced Practice Providers (APPs -  Physician Assistants and Nurse Practitioners) who all work together to provide you  with the care you need, when you need it.  We recommend signing up for the patient portal called "MyChart".  Sign up information is provided on this After Visit Summary.  MyChart is used to connect with patients for Virtual Visits (Telemedicine).  Patients are able  to view lab/test results, encounter notes, upcoming appointments, etc.  Non-urgent messages can be sent to your provider as well.   To learn more about what you can do with MyChart, go to [email or web address].    Your next appointment:   6 month(s)  The format for your next appointment:   In Person  Provider:   Elouise Munroe, MD    AMBULATORY REFERRAL TO ELECTROPHYSIOLOGY- Dr. Quentin Ore SOMEONE WILL Ramos

## 2022-05-10 NOTE — Patient Instructions (Signed)
Medication Instructions:  No Changes In Medications at this time.  *If you need a refill on your cardiac medications before your next appointment, please call your pharmacy*  Lab Work: None Ordered At This Time.  If you have labs (blood work) drawn today and your tests are completely normal, you will receive your results only by: [place] (if you have MyChart) OR A paper copy in the mail If you have any lab test that is abnormal or we need to change your treatment, we will call you to review the results.  Testing/Procedures: None Ordered At This Time.   Follow-Up: At [facility], you and your health needs are our priority.  As part of our continuing mission to provide you with exceptional heart care, we have created designated Provider Care Teams.  These Care Teams include your primary Cardiologist (physician) and Advanced Practice Providers (APPs -  Physician Assistants and Nurse Practitioners) who all work together to provide you with the care you need, when you need it.  We recommend signing up for the patient portal called "MyChart".  Sign up information is provided on this After Visit Summary.  MyChart is used to connect with patients for Virtual Visits (Telemedicine).  Patients are able to view lab/test results, encounter notes, upcoming appointments, etc.  Non-urgent messages can be sent to your provider as well.   To learn more about what you can do with MyChart, go to [email or web address].    Your next appointment:   6 month(s)  The format for your next appointment:   In Person  Provider:   Elouise Munroe, MD    AMBULATORY REFERRAL TO ELECTROPHYSIOLOGY- Dr. Quentin Ore SOMEONE WILL [place]

## 2022-05-13 ENCOUNTER — Ambulatory Visit (INDEPENDENT_AMBULATORY_CARE_PROVIDER_SITE_OTHER): Payer: Medicare Other | Admitting: Infectious Diseases

## 2022-05-13 ENCOUNTER — Other Ambulatory Visit: Payer: Self-pay

## 2022-05-13 ENCOUNTER — Encounter: Payer: Self-pay | Admitting: Infectious Diseases

## 2022-05-13 VITALS — BP 125/77 | HR 96 | Temp 98.1°F | Wt 183.0 lb

## 2022-05-13 DIAGNOSIS — B955 Unspecified streptococcus as the cause of diseases classified elsewhere: Secondary | ICD-10-CM

## 2022-05-13 DIAGNOSIS — Z5181 Encounter for therapeutic drug level monitoring: Secondary | ICD-10-CM | POA: Diagnosis not present

## 2022-05-13 DIAGNOSIS — I130 Hypertensive heart and chronic kidney disease with heart failure and stage 1 through stage 4 chronic kidney disease, or unspecified chronic kidney disease: Secondary | ICD-10-CM | POA: Diagnosis not present

## 2022-05-13 DIAGNOSIS — N2581 Secondary hyperparathyroidism of renal origin: Secondary | ICD-10-CM | POA: Diagnosis not present

## 2022-05-13 DIAGNOSIS — Z952 Presence of prosthetic heart valve: Secondary | ICD-10-CM | POA: Diagnosis not present

## 2022-05-13 DIAGNOSIS — I5042 Chronic combined systolic (congestive) and diastolic (congestive) heart failure: Secondary | ICD-10-CM | POA: Diagnosis not present

## 2022-05-13 DIAGNOSIS — R7881 Bacteremia: Secondary | ICD-10-CM | POA: Diagnosis not present

## 2022-05-13 DIAGNOSIS — A408 Other streptococcal sepsis: Secondary | ICD-10-CM | POA: Diagnosis not present

## 2022-05-13 DIAGNOSIS — Z452 Encounter for adjustment and management of vascular access device: Secondary | ICD-10-CM | POA: Diagnosis not present

## 2022-05-13 DIAGNOSIS — N184 Chronic kidney disease, stage 4 (severe): Secondary | ICD-10-CM | POA: Diagnosis not present

## 2022-05-13 DIAGNOSIS — D631 Anemia in chronic kidney disease: Secondary | ICD-10-CM | POA: Diagnosis not present

## 2022-05-13 NOTE — Progress Notes (Addendum)
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 gallolyticus    Thrombocytopenia ([place]) 03/28/2022   CKD (chronic kidney disease) stage 4, GFR 15-29 ml/min ([place]) 03/28/2022   Streptococcal bacteremia    Acute bacterial endocarditis    Streptococcal sepsis, unspecified ([place]) 03/27/2022   Hyperlipidemia 03/27/2022   Hypertension 03/27/2022   Class 1 obesity 03/27/2022   Gout 03/27/2022   Benign prostatic hyperplasia with lower urinary tract symptoms 03/27/2022   COVID-19 03/27/2022   History of colonic polyps 03/27/2022   Supratherapeutic INR 03/27/2022   Elevated troponin 03/27/2022   Acute respiratory failure with hypoxia (HCC) 03/27/2022   Paroxysmal ventricular tachycardia (HCC) 08/23/2020   Chronic kidney disease due to hypertension 03/21/2020   Chronic heart failure with preserved ejection fraction ([place]) 01/19/2020   S/P MVR (mitral valve replacement) 01/19/2020   S/P AVR 01/19/2020   Actinic keratosis 05/12/2015   Black stools 04/01/2014   GI bleed 04/01/2014   Occult blood in stools 08/11/2012   Chronic anticoagulation 08/11/2012   Atrial fibrillation ([place]) 10/03/2011   Chronic gouty arthritis 10/03/2011   Coronary artery disease 10/03/2011   Herpes simplex viral infection 05/24/2010   Mitral valve disorder 01/18/2010   Acute and subacute bacterial endocarditis 11/07/2009   Secondary hyperparathyroidism ([place]) 11/07/2009    Patient's Medications  New Prescriptions   No medications on file  Previous Medications   ACETAMINOPHEN (TYLENOL) 500 MG TABLET    Take 500 mg by mouth See admin instructions. 2- 3 times a day   ALLOPURINOL (ZYLOPRIM) 100 MG TABLET    Take 400 mg by mouth daily.   AMIODARONE (PACERONE) 200 MG TABLET    Take 200 mg by mouth daily.   BUMETANIDE (BUMEX) 2 MG TABLET    Take 1 tablet (2 mg total)  by mouth daily with breakfast.   CALCITRIOL (ROCALTROL) 0.5 MCG CAPSULE    Take 0.5 mcg by mouth every other day.   CALCIUM CARBONATE (TUMS - DOSED IN MG ELEMENTAL CALCIUM) 500 MG CHEWABLE TABLET    Chew 1-2 tablets by mouth as needed for indigestion or heartburn.   CETIRIZINE (ZYRTEC) 10 MG TABLET    Take 10 mg by mouth at bedtime.   DICLOFENAC SODIUM (VOLTAREN) 1 % GEL    Apply 2 g topically 4 (four) times daily as needed (to painful sites).   DIPHENHYDRAMINE (BENADRYL) 25 MG TABLET    Take 25 mg by mouth at bedtime as needed (FOR BEE STING-RELATED ALLERGIC REACTIONS).   GLUCOSAMINE SULFATE 1000 MG TABS    Take 1 tablet by mouth every evening.   METOPROLOL SUCCINATE (TOPROL-XL) 25 MG 24 HR TABLET    Take 1 tablet (25 mg total) by mouth daily.   MEXILETINE (MEXITIL) 200 MG CAPSULE    Take 200 mg by mouth in the morning, at noon, and at bedtime.   MULTIPLE VITAMINS-MINERALS (CENTRUM SILVER 50+MEN) TABS    Take 1 tablet by mouth daily with breakfast.   OMEPRAZOLE (PRILOSEC) 20 MG CAPSULE    Take 1 capsule (20 mg total) by mouth daily.   POTASSIUM CHLORIDE SA (KLOR-CON M) 20 MEQ TABLET    Take 1 tablet (20 mEq total) by mouth daily.   ROLAIDS 550-110 MG CHEW    Chew 1-2 tablets by mouth as needed (for heartburn).   SIMETHICONE (MYLICON) 703 MG CHEWABLE TABLET  Chew 125 mg by mouth every 6 (six) hours as needed for flatulence.   TAMSULOSIN (FLOMAX) 0.4 MG CAPS CAPSULE    Take 0.8 mg by mouth at bedtime.   WARFARIN (COUMADIN) 3 MG TABLET    Take 1 tablet (3 mg total) by mouth daily at 4 PM. Follow up with repeat INR check 4/17.  Modified Medications   No medications on file  Discontinued Medications   No medications on file    Subjective: Here for HFU for strep gallolyticus bacteremia of unclear source in the setting of presence of mechanical AV and MV. Accompanied by wife. Has completed 6 weeks course of Pen G through PICC on 5/18. PICC has been removed. Denies nausea, vomiting, rashes or  diarrhea. Seen in the ED on 5/5 for SOB and tachycardia and was discharged from the ED with Fu with Cardiology. Saw Cardiology on 5/19. Dose of metoprolol has been increased from 12.5 to 25 mg po daily. Doing well and no complaints today.   Review of Systems: All systems reviewed with pertinent positives and negatives as listed above  Past Medical History:  Diagnosis Date   Anemia    Arthritis    Atrial fibrillation ([place])    Diverticulosis    Endocarditis    GI bleed    Gout    Heart murmur    Hx of adenomatous colonic polyps    Hyperlipemia    Hyperparathyroidism ([place])    Hypertension    Nephrosclerosis    Sepsis ([place])    Past Surgical History:  Procedure Laterality Date   AORTIC AND MITRAL VALVE REPLACEMENT     CATARACT EXTRACTION Left    with lens implant   CORONARY ARTERY BYPASS GRAFT     MITRAL VALVE REPLACEMENT     ORIF ARM     TEE WITHOUT CARDIOVERSION N/A 04/03/2022   Procedure: TRANSESOPHAGEAL ECHOCARDIOGRAM (TEE);  Surgeon: Freada Bergeron, MD;  Location: [facility] ENDOSCOPY;  Service: Cardiovascular;  Laterality: N/A;     Social History   Tobacco Use   Smoking status: Former   Smokeless tobacco: Never  Vaping Use   Vaping Use: Never used  Substance Use Topics   Alcohol use: No   Drug use: No    Family History  Problem Relation Age of Onset   Hypertension Mother    Stroke Mother    Hypertension Father    Liver cancer Brother 61   Breast cancer Daughter    Uterine cancer Daughter     Allergies  Allergen Reactions   Bee Venom Anaphylaxis   Ace Inhibitors Cough   Atorvastatin Other (See Comments)    Muscle Pain   Pravastatin Other (See Comments)    Muscle Pain   Ranexa [Ranolazine] Other (See Comments)    Tachycardia    Health Maintenance  Topic Date Due   URINE MICROALBUMIN  Never done   Zoster Vaccines- Shingrix (1 of 2) Never done   Pneumonia Vaccine 83+ Years old (1 - PCV) Never done   COVID-19 Vaccine (3 - Pfizer risk series)  03/27/2020   INFLUENZA VACCINE  07/23/2022   TETANUS/TDAP  07/10/2023   HPV VACCINES  Aged Out    Objective: BP 125/77   Pulse 96   Temp 98.1 F (36.7 C) (Temporal)   Wt 183 lb (83 kg)   BMI 31.41 kg/m    Physical Exam Constitutional:      Appearance: Elderly male sitting up in the bed  HENT:     Head: Normocephalic  and atraumatic.      Mouth: Mucous membranes are moist.  Eyes:    Conjunctiva/sclera: Conjunctivae normal.     Pupils:   Cardiovascular:     Rate and Rhythm: Normal rate , mechanical heart sounds     Heart sounds:  Pulmonary:     Effort: Pulmonary effort is normal.     Breath sounds: Normal breath sounds.   Abdominal:     General: Non distended     Palpations: soft.   Musculoskeletal:        General: Normal range of motion.   Skin:    General: Skin is warm and dry.     Comments:  Neurological:     General: grossly non focal     Mental Status: awake, alert and oriented to person, place, and time.   Psychiatric:        Mood and Affect: Mood normal.   Lab Results Lab Results  Component Value Date   WBC 6.0 04/26/2022   HGB 13.2 04/26/2022   HCT 39.7 04/26/2022   MCV 102.8 (H) 04/26/2022   PLT 182 04/26/2022    Lab Results  Component Value Date   CREATININE 2.20 (H) 04/26/2022   BUN 30 (H) 04/26/2022   NA 142 04/26/2022   K 4.0 04/26/2022   CL 108 04/26/2022   CO2 27 04/26/2022    Lab Results  Component Value Date   ALT 17 04/26/2022   AST 29 04/26/2022   ALKPHOS 66 04/26/2022   BILITOT 0.8 04/26/2022    No results found for: CHOL, HDL, LDLCALC, LDLDIRECT, TRIG, CHOLHDL No results found for: LABRPR, RPRTITER No results found for: HIV1RNAQUANT, HIV1RNAVL, CD4TABS   Problem List Items Addressed This Visit       Other   S/P MVR (mitral valve replacement)   S/P AVR   Streptococcal bacteremia - Primary   Medication monitoring encounter   PICC (peripherally inserted central catheter) in place    Assessment # High grade  strep gallolyticus bacteremia with TEE negative for PV endocarditis - Patient declined colonoscopy  - Completed 6 weeks of Pen G 5/18 - Fu as needed    # CHF rEF # A Fib s/p ablation  # s/p mechanical AVR and MVR Follows Cardiology  # Medication monitoring  5/22 Cr 1.86  # PICC - removed   I have personally spent 40 minutes involved in face-to-face and non-face-to-face activities for this patient on the day of the visit including counseling of the patient and coordination of the care.   Wilber Oliphant, [place] [facility] 05/13/2022, 1:05 PM

## 2022-05-16 DIAGNOSIS — Z7901 Long term (current) use of anticoagulants: Secondary | ICD-10-CM | POA: Diagnosis not present

## 2022-05-16 DIAGNOSIS — Z952 Presence of prosthetic heart valve: Secondary | ICD-10-CM | POA: Diagnosis not present

## 2022-05-16 DIAGNOSIS — I4891 Unspecified atrial fibrillation: Secondary | ICD-10-CM | POA: Diagnosis not present

## 2022-05-21 DIAGNOSIS — D631 Anemia in chronic kidney disease: Secondary | ICD-10-CM | POA: Diagnosis not present

## 2022-05-21 DIAGNOSIS — I130 Hypertensive heart and chronic kidney disease with heart failure and stage 1 through stage 4 chronic kidney disease, or unspecified chronic kidney disease: Secondary | ICD-10-CM | POA: Diagnosis not present

## 2022-05-21 DIAGNOSIS — N2581 Secondary hyperparathyroidism of renal origin: Secondary | ICD-10-CM | POA: Diagnosis not present

## 2022-05-21 DIAGNOSIS — A408 Other streptococcal sepsis: Secondary | ICD-10-CM | POA: Diagnosis not present

## 2022-05-21 DIAGNOSIS — N184 Chronic kidney disease, stage 4 (severe): Secondary | ICD-10-CM | POA: Diagnosis not present

## 2022-05-21 DIAGNOSIS — I5042 Chronic combined systolic (congestive) and diastolic (congestive) heart failure: Secondary | ICD-10-CM | POA: Diagnosis not present

## 2022-05-27 DIAGNOSIS — N2581 Secondary hyperparathyroidism of renal origin: Secondary | ICD-10-CM | POA: Diagnosis not present

## 2022-05-27 DIAGNOSIS — D631 Anemia in chronic kidney disease: Secondary | ICD-10-CM | POA: Diagnosis not present

## 2022-05-27 DIAGNOSIS — A408 Other streptococcal sepsis: Secondary | ICD-10-CM | POA: Diagnosis not present

## 2022-05-27 DIAGNOSIS — N184 Chronic kidney disease, stage 4 (severe): Secondary | ICD-10-CM | POA: Diagnosis not present

## 2022-05-27 DIAGNOSIS — I5042 Chronic combined systolic (congestive) and diastolic (congestive) heart failure: Secondary | ICD-10-CM | POA: Diagnosis not present

## 2022-05-27 DIAGNOSIS — I130 Hypertensive heart and chronic kidney disease with heart failure and stage 1 through stage 4 chronic kidney disease, or unspecified chronic kidney disease: Secondary | ICD-10-CM | POA: Diagnosis not present

## 2022-05-30 ENCOUNTER — Ambulatory Visit: Payer: Medicare Other | Admitting: Internal Medicine

## 2022-05-31 ENCOUNTER — Telehealth: Payer: Self-pay | Admitting: Internal Medicine

## 2022-05-31 NOTE — Telephone Encounter (Signed)
*  STAT* If patient is at the pharmacy, call can be transferred to refill team.   1. Which medications need to be refilled? (please list name of each medication and dose if known)   metoprolol succinate (TOPROL-XL) 25 MG 24 hr tablet    2. Which pharmacy/location (including street and city if local pharmacy) is medication to be sent to? [id number] - , [place] - [address]  3. Do they need a 30 day or 90 day supply?  90 day  

## 2022-06-20 DIAGNOSIS — Z125 Encounter for screening for malignant neoplasm of prostate: Secondary | ICD-10-CM | POA: Diagnosis not present

## 2022-06-20 DIAGNOSIS — R7989 Other specified abnormal findings of blood chemistry: Secondary | ICD-10-CM | POA: Diagnosis not present

## 2022-06-20 DIAGNOSIS — Z7901 Long term (current) use of anticoagulants: Secondary | ICD-10-CM | POA: Diagnosis not present

## 2022-06-20 DIAGNOSIS — E785 Hyperlipidemia, unspecified: Secondary | ICD-10-CM | POA: Diagnosis not present

## 2022-06-20 DIAGNOSIS — I10 Essential (primary) hypertension: Secondary | ICD-10-CM | POA: Diagnosis not present

## 2022-06-27 DIAGNOSIS — D6869 Other thrombophilia: Secondary | ICD-10-CM | POA: Diagnosis not present

## 2022-06-27 DIAGNOSIS — R82998 Other abnormal findings in urine: Secondary | ICD-10-CM | POA: Diagnosis not present

## 2022-06-27 DIAGNOSIS — R972 Elevated prostate specific antigen [PSA]: Secondary | ICD-10-CM | POA: Diagnosis not present

## 2022-06-27 DIAGNOSIS — E0580 Other thyrotoxicosis without thyrotoxic crisis or storm: Secondary | ICD-10-CM | POA: Diagnosis not present

## 2022-06-27 DIAGNOSIS — Z0000 Encounter for general adult medical examination without abnormal findings: Secondary | ICD-10-CM | POA: Diagnosis not present

## 2022-06-27 DIAGNOSIS — I480 Paroxysmal atrial fibrillation: Secondary | ICD-10-CM | POA: Diagnosis not present

## 2022-06-27 DIAGNOSIS — E785 Hyperlipidemia, unspecified: Secondary | ICD-10-CM | POA: Diagnosis not present

## 2022-06-27 DIAGNOSIS — M109 Gout, unspecified: Secondary | ICD-10-CM | POA: Diagnosis not present

## 2022-06-27 DIAGNOSIS — T462X5A Adverse effect of other antidysrhythmic drugs, initial encounter: Secondary | ICD-10-CM | POA: Diagnosis not present

## 2022-06-27 DIAGNOSIS — I129 Hypertensive chronic kidney disease with stage 1 through stage 4 chronic kidney disease, or unspecified chronic kidney disease: Secondary | ICD-10-CM | POA: Diagnosis not present

## 2022-06-27 DIAGNOSIS — I10 Essential (primary) hypertension: Secondary | ICD-10-CM | POA: Diagnosis not present

## 2022-06-27 DIAGNOSIS — Z7901 Long term (current) use of anticoagulants: Secondary | ICD-10-CM | POA: Diagnosis not present

## 2022-06-27 DIAGNOSIS — N1832 Chronic kidney disease, stage 3b: Secondary | ICD-10-CM | POA: Diagnosis not present

## 2022-07-08 ENCOUNTER — Ambulatory Visit (INDEPENDENT_AMBULATORY_CARE_PROVIDER_SITE_OTHER): Payer: Medicare Other | Admitting: Cardiology

## 2022-07-08 ENCOUNTER — Encounter: Payer: Self-pay | Admitting: Cardiology

## 2022-07-08 VITALS — BP 122/72 | HR 96 | Ht 64.0 in | Wt 177.0 lb

## 2022-07-08 DIAGNOSIS — I480 Paroxysmal atrial fibrillation: Secondary | ICD-10-CM

## 2022-07-08 DIAGNOSIS — I472 Ventricular tachycardia, unspecified: Secondary | ICD-10-CM | POA: Diagnosis not present

## 2022-07-08 DIAGNOSIS — Z952 Presence of prosthetic heart valve: Secondary | ICD-10-CM

## 2022-07-08 NOTE — Patient Instructions (Signed)
Medication Instructions:  Your physician recommends that you continue on your current medications as directed. Please refer to the Current Medication list given to you today. *If you need a refill on your cardiac medications before your next appointment, please call your pharmacy*  Lab Work: None. If you have labs (blood work) drawn today and your tests are completely normal, you will receive your results only by: [place] (if you have MyChart) OR A paper copy in the mail If you have any lab test that is abnormal or we need to change your treatment, we will call you to review the results.  Testing/Procedures: None.  Follow-Up: At [facility], you and your health needs are our priority.  As part of our continuing mission to provide you with exceptional heart care, we have created designated Provider Care Teams.  These Care Teams include your primary Cardiologist (physician) and Advanced Practice Providers (APPs -  Physician Assistants and Nurse Practitioners) who all work together to provide you with the care you need, when you need it.  Your physician wants you to follow-up in: 6 months with one of the following Advanced Practice Providers on your designated Care Team:    Steve Riddle, Steve Riddle "Steve Riddle" [place], Steve   You will receive a reminder letter in the mail two months in advance. If you don't receive a letter, please call our office to schedule the follow-up appointment.  We recommend signing up for the patient portal called "MyChart".  Sign up information is provided on this After Visit Summary.  MyChart is used to connect with patients for Virtual Visits (Telemedicine).  Patients are able to view lab/test results, encounter notes, upcoming appointments, etc.  Non-urgent messages can be sent to your provider as well.   To learn more about what you can do with MyChart, go to [email or web address].    Any Other Special Instructions Will Be Listed Below (If  Applicable).

## 2022-07-08 NOTE — Progress Notes (Signed)
Electrophysiology Office Note:    Date:  07/08/2022   ID:  Steve Mins., DOB [birth year], MRN [id number]  PCP:  [name], MD  [facility] Cardiologist:  Elouise Munroe, MD  [facility] Electrophysiologist:  Vickie Epley, MD   Referring MD: Elouise Munroe, MD   Chief Complaint: Ventricular tachycardia  History of Present Illness:    Steve Boze. is a 88 y.o. male who presents for an evaluation of ventricular tachycardia at the request of Dr. Margaretann Loveless. Their medical history includes bacteremia/endocarditis, atrial flutter post CTI ablation, atrial fibrillation, mechanical mitral and aortic valve, coronary artery disease and COVID myocarditis.  His history of ventricular tachycardia was in the setting of COVID myocarditis.  He is currently on amiodarone and mexiletine.  He reports no syncope or presyncope today.  He is with his wife.  He thinks that his weight is relatively stable.  Some intermittent lower extremity edema.  His primary care physician is monitoring thyroid abnormalities associate with the amiodarone.  He has recently been started on methimazole.     Past Medical History:  Diagnosis Date   Anemia    Arthritis    Atrial fibrillation (HCC)    Diverticulosis    Endocarditis    GI bleed    Gout    Heart murmur    Hx of adenomatous colonic polyps    Hyperlipemia    Hyperparathyroidism ([place])    Hypertension    Nephrosclerosis    Sepsis ([place])     Past Surgical History:  Procedure Laterality Date   AORTIC AND MITRAL VALVE REPLACEMENT     CATARACT EXTRACTION Left    with lens implant   CORONARY ARTERY BYPASS GRAFT     MITRAL VALVE REPLACEMENT     ORIF ARM     TEE WITHOUT CARDIOVERSION N/A 04/03/2022   Procedure: TRANSESOPHAGEAL ECHOCARDIOGRAM (TEE);  Surgeon: Freada Bergeron, MD;  Location: [facility] ENDOSCOPY;  Service: Cardiovascular;  Laterality: N/A;    Current Medications: Current Meds  Medication Sig   acetaminophen (TYLENOL)  500 MG tablet Take 500 mg by mouth See admin instructions. 2- 3 times a day   allopurinol (ZYLOPRIM) 100 MG tablet Take 400 mg by mouth daily.   amiodarone (PACERONE) 200 MG tablet Take 200 mg by mouth daily.   bumetanide (BUMEX) 2 MG tablet Take 1 tablet (2 mg total) by mouth daily with breakfast. (Patient taking differently: Take 2 mg by mouth 2 (two) times daily.)   calcitRIOL (ROCALTROL) 0.5 MCG capsule Take 0.5 mcg by mouth every other day.   calcium carbonate (TUMS - DOSED IN MG ELEMENTAL CALCIUM) 500 MG chewable tablet Chew 1-2 tablets by mouth as needed for indigestion or heartburn.   cetirizine (ZYRTEC) 10 MG tablet Take 10 mg by mouth at bedtime.   diclofenac Sodium (VOLTAREN) 1 % GEL Apply 2 g topically 4 (four) times daily as needed (to painful sites).   diphenhydrAMINE (BENADRYL) 25 MG tablet Take 25 mg by mouth at bedtime as needed (FOR BEE STING-RELATED ALLERGIC REACTIONS).   Glucosamine Sulfate 1000 MG TABS Take 1 tablet by mouth every evening.   metoprolol succinate (TOPROL-XL) 25 MG 24 hr tablet Take 1 tablet (25 mg total) by mouth daily.   mexiletine (MEXITIL) 200 MG capsule Take 200 mg by mouth in the morning, at noon, and at bedtime.   Multiple Vitamins-Minerals (CENTRUM SILVER 50+MEN) TABS Take 1 tablet by mouth daily with breakfast.   omeprazole (PRILOSEC) 20 MG capsule Take  1 capsule (20 mg total) by mouth daily.   potassium chloride SA (KLOR-CON M) 20 MEQ tablet Take 1 tablet (20 mEq total) by mouth daily. (Patient taking differently: Take 20 mEq by mouth 2 (two) times daily.)   ROLAIDS 550-110 MG CHEW Chew 1-2 tablets by mouth as needed (for heartburn).   simethicone (MYLICON) 338 MG chewable tablet Chew 125 mg by mouth every 6 (six) hours as needed for flatulence.   tamsulosin (FLOMAX) 0.4 MG CAPS capsule Take 0.8 mg by mouth at bedtime.   warfarin (COUMADIN) 3 MG tablet Take 1 tablet (3 mg total) by mouth daily at 4 PM. Follow up with repeat INR check 4/17.      Allergies:   Bee venom, Ace inhibitors, Atorvastatin, Pravastatin, and Ranexa [ranolazine]   Social History   Socioeconomic History   Marital status: Married    Spouse name: Not on file   Number of children: Not on file   Years of education: Not on file   Highest education level: Not on file  Occupational History   Not on file  Tobacco Use   Smoking status: Former   Smokeless tobacco: Never  Vaping Use   Vaping Use: Never used  Substance and Sexual Activity   Alcohol use: No   Drug use: No   Sexual activity: Not on file  Other Topics Concern   Not on file  Social History Narrative   Not on file   Social Determinants of Health   Financial Resource Strain: Not on file  Food Insecurity: Not on file  Transportation Needs: Not on file  Physical Activity: Not on file  Stress: Not on file  Social Connections: Not on file     Family History: The patient's family history includes Breast cancer in his daughter; Hypertension in his father and mother; Liver cancer (age of onset: 53) in his brother; Stroke in his mother; Uterine cancer in his daughter.  ROS:   Please see the history of present illness.    All other systems reviewed and are negative.  EKGs/Labs/Other Studies Reviewed:    The following studies were reviewed today:  March 28, 2022 echo Left ventricular function severely reduced, 20 to 25% Right ventricular function mildly reduced Severely dilated left atrium Mildly dilated right atrium Trivial MR Mild MS Mild AI  Lab work from June 20, 2022 includes an ALT of 28, TSH of 0.01.  INR was 3.3 on Apr 26, 2022.   Recent Labs: 03/29/2022: Magnesium 2.2 04/26/2022: ALT 17; B Natriuretic Peptide 375.7; BUN 30; Creatinine, Ser 2.20; Hemoglobin 13.2; Platelets 182; Potassium 4.0; Sodium 142  Recent Lipid Panel No results found for: "CHOL", "TRIG", "HDL", "CHOLHDL", "VLDL", "LDLCALC", "LDLDIRECT"  Physical Exam:    VS:  BP 122/72   Pulse 96   Ht '5\' 4"'$  (1.626 m)    Wt 177 lb (80.3 kg)   SpO2 99%   BMI 30.38 kg/m     Wt Readings from Last 3 Encounters:  07/08/22 177 lb (80.3 kg)  05/13/22 183 lb (83 kg)  05/10/22 184 lb 6.4 oz (83.6 kg)     GEN:  Well nourished, well developed in no acute distress.  Elderly HEENT: Normal NECK: No JVD; No carotid bruits LYMPHATICS: No lymphadenopathy CARDIAC: RRR, no murmurs, rubs, gallops.  Mechanical click noted of mitral and aortic valves. RESPIRATORY:  Clear to auscultation without rales, wheezing or rhonchi  ABDOMEN: Soft, non-tender, non-distended MUSCULOSKELETAL: Trivial to 1+ pitting edema in the left greater than right lower  extremity to the ankles; No deformity  SKIN: Warm and dry NEUROLOGIC:  Alert and oriented x 3 PSYCHIATRIC:  Normal affect       ASSESSMENT:    1. VT (ventricular tachycardia) (HCC)   2. Paroxysmal atrial fibrillation ([place])   3. S/P MVR (mitral valve replacement)    PLAN:    In order of problems listed above:   #VT No recurrence that we are aware of.  Has been doing okay on mexiletine and amiodarone.  Continue amiodarone 200 mg by mouth once daily and mexiletine 200 mg by mouth 3 times daily.  I will have him send his lab work from his primary care physician to our office for review.  He will need follow-up every 6 months with an APP/me to monitor these antiarrhythmic drugs.  ICD has been discussed with the patient but he was deemed not to be a candidate given comorbidities, age, history of bacteremia.  This was discussed in clinic today.  I agree that he is not a candidate for defibrillator implant.  #Paroxysmal atrial fibrillation Maintaining sinus rhythm on amiodarone.  #Post aortic and mitral valve replacements Follows with primary care for Coumadin   Follow-up 6 months with APP.  Blood work and EKG at that visit.     Medication Adjustments/Labs and Tests Ordered: Current medicines are reviewed at length with the patient today.  Concerns regarding  medicines are outlined above.  No orders of the defined types were placed in this encounter.  No orders of the defined types were placed in this encounter.    Signed, Hilton Cork. Quentin Ore, MD, [facility], [facility] 07/08/2022 10:22 AM    Electrophysiology [facility]

## 2022-07-17 DIAGNOSIS — I483 Typical atrial flutter: Secondary | ICD-10-CM | POA: Diagnosis not present

## 2022-07-17 DIAGNOSIS — I420 Dilated cardiomyopathy: Secondary | ICD-10-CM | POA: Diagnosis not present

## 2022-07-17 DIAGNOSIS — E7800 Pure hypercholesterolemia, unspecified: Secondary | ICD-10-CM | POA: Diagnosis not present

## 2022-07-17 DIAGNOSIS — Z8679 Personal history of other diseases of the circulatory system: Secondary | ICD-10-CM | POA: Diagnosis not present

## 2022-07-17 DIAGNOSIS — I2510 Atherosclerotic heart disease of native coronary artery without angina pectoris: Secondary | ICD-10-CM | POA: Diagnosis not present

## 2022-07-17 DIAGNOSIS — I10 Essential (primary) hypertension: Secondary | ICD-10-CM | POA: Diagnosis not present

## 2022-07-17 DIAGNOSIS — Z952 Presence of prosthetic heart valve: Secondary | ICD-10-CM | POA: Diagnosis not present

## 2022-07-25 DIAGNOSIS — Z7901 Long term (current) use of anticoagulants: Secondary | ICD-10-CM | POA: Diagnosis not present

## 2022-07-25 DIAGNOSIS — Z952 Presence of prosthetic heart valve: Secondary | ICD-10-CM | POA: Diagnosis not present

## 2022-07-25 DIAGNOSIS — I4891 Unspecified atrial fibrillation: Secondary | ICD-10-CM | POA: Diagnosis not present

## 2022-07-30 DIAGNOSIS — T462X5A Adverse effect of other antidysrhythmic drugs, initial encounter: Secondary | ICD-10-CM | POA: Diagnosis not present

## 2022-07-30 DIAGNOSIS — N2581 Secondary hyperparathyroidism of renal origin: Secondary | ICD-10-CM | POA: Diagnosis not present

## 2022-07-30 DIAGNOSIS — I4891 Unspecified atrial fibrillation: Secondary | ICD-10-CM | POA: Diagnosis not present

## 2022-07-30 DIAGNOSIS — D631 Anemia in chronic kidney disease: Secondary | ICD-10-CM | POA: Diagnosis not present

## 2022-07-30 DIAGNOSIS — N184 Chronic kidney disease, stage 4 (severe): Secondary | ICD-10-CM | POA: Diagnosis not present

## 2022-07-30 DIAGNOSIS — I129 Hypertensive chronic kidney disease with stage 1 through stage 4 chronic kidney disease, or unspecified chronic kidney disease: Secondary | ICD-10-CM | POA: Diagnosis not present

## 2022-07-30 DIAGNOSIS — E0580 Other thyrotoxicosis without thyrotoxic crisis or storm: Secondary | ICD-10-CM | POA: Diagnosis not present

## 2022-07-30 DIAGNOSIS — I330 Acute and subacute infective endocarditis: Secondary | ICD-10-CM | POA: Diagnosis not present

## 2022-07-30 DIAGNOSIS — N1832 Chronic kidney disease, stage 3b: Secondary | ICD-10-CM | POA: Diagnosis not present

## 2022-08-01 ENCOUNTER — Ambulatory Visit: Payer: Medicare Other | Admitting: Internal Medicine

## 2022-08-16 ENCOUNTER — Telehealth: Payer: Self-pay | Admitting: Cardiology

## 2022-08-16 MED ORDER — MEXILETINE HCL 200 MG PO CAPS: 200.0000 mg | ORAL_CAPSULE | Freq: Three times a day (TID) | ORAL | 1 refills | Status: DC

## 2022-08-16 NOTE — Telephone Encounter (Signed)
*  STAT* If patient is at the pharmacy, call can be transferred to refill team.   1. Which medications need to be refilled? (please list name of each medication and dose if known) mexiletine (MEXITIL) 200 MG capsule  2. Which pharmacy/location (including street and city if local pharmacy) is medication to be sent to? [place], [place] AT [place]  3. Do they need a 30 day or 90 day supply? [place]

## 2022-08-20 DIAGNOSIS — Z7901 Long term (current) use of anticoagulants: Secondary | ICD-10-CM | POA: Diagnosis not present

## 2022-08-20 DIAGNOSIS — I10 Essential (primary) hypertension: Secondary | ICD-10-CM | POA: Diagnosis not present

## 2022-08-20 DIAGNOSIS — E0580 Other thyrotoxicosis without thyrotoxic crisis or storm: Secondary | ICD-10-CM | POA: Diagnosis not present

## 2022-08-20 DIAGNOSIS — I4891 Unspecified atrial fibrillation: Secondary | ICD-10-CM | POA: Diagnosis not present

## 2022-08-28 DIAGNOSIS — R972 Elevated prostate specific antigen [PSA]: Secondary | ICD-10-CM | POA: Diagnosis not present

## 2022-09-04 DIAGNOSIS — R972 Elevated prostate specific antigen [PSA]: Secondary | ICD-10-CM | POA: Diagnosis not present

## 2022-09-04 DIAGNOSIS — N401 Enlarged prostate with lower urinary tract symptoms: Secondary | ICD-10-CM | POA: Diagnosis not present

## 2022-09-17 DIAGNOSIS — D485 Neoplasm of uncertain behavior of skin: Secondary | ICD-10-CM | POA: Diagnosis not present

## 2022-09-17 DIAGNOSIS — I480 Paroxysmal atrial fibrillation: Secondary | ICD-10-CM | POA: Diagnosis not present

## 2022-09-17 DIAGNOSIS — Z85828 Personal history of other malignant neoplasm of skin: Secondary | ICD-10-CM | POA: Diagnosis not present

## 2022-09-17 DIAGNOSIS — D044 Carcinoma in situ of skin of scalp and neck: Secondary | ICD-10-CM | POA: Diagnosis not present

## 2022-09-17 DIAGNOSIS — L570 Actinic keratosis: Secondary | ICD-10-CM | POA: Diagnosis not present

## 2022-09-17 DIAGNOSIS — Z952 Presence of prosthetic heart valve: Secondary | ICD-10-CM | POA: Diagnosis not present

## 2022-09-17 DIAGNOSIS — Z7901 Long term (current) use of anticoagulants: Secondary | ICD-10-CM | POA: Diagnosis not present

## 2022-09-25 DIAGNOSIS — Z961 Presence of intraocular lens: Secondary | ICD-10-CM | POA: Diagnosis not present

## 2022-09-25 DIAGNOSIS — H35033 Hypertensive retinopathy, bilateral: Secondary | ICD-10-CM | POA: Diagnosis not present

## 2022-09-25 DIAGNOSIS — H2513 Age-related nuclear cataract, bilateral: Secondary | ICD-10-CM | POA: Diagnosis not present

## 2022-10-15 DIAGNOSIS — Z23 Encounter for immunization: Secondary | ICD-10-CM | POA: Diagnosis not present

## 2022-10-15 DIAGNOSIS — E785 Hyperlipidemia, unspecified: Secondary | ICD-10-CM | POA: Diagnosis not present

## 2022-10-15 DIAGNOSIS — Z7901 Long term (current) use of anticoagulants: Secondary | ICD-10-CM | POA: Diagnosis not present

## 2022-10-15 DIAGNOSIS — I359 Nonrheumatic aortic valve disorder, unspecified: Secondary | ICD-10-CM | POA: Diagnosis not present

## 2022-10-15 DIAGNOSIS — I480 Paroxysmal atrial fibrillation: Secondary | ICD-10-CM | POA: Diagnosis not present

## 2022-10-15 DIAGNOSIS — I10 Essential (primary) hypertension: Secondary | ICD-10-CM | POA: Diagnosis not present

## 2022-10-15 DIAGNOSIS — E0580 Other thyrotoxicosis without thyrotoxic crisis or storm: Secondary | ICD-10-CM | POA: Diagnosis not present

## 2022-11-05 ENCOUNTER — Ambulatory Visit: Payer: Medicare Other | Attending: Internal Medicine | Admitting: Internal Medicine

## 2022-11-05 ENCOUNTER — Encounter: Payer: Self-pay | Admitting: Internal Medicine

## 2022-11-05 VITALS — BP 120/60 | HR 92 | Ht 64.0 in | Wt 188.0 lb

## 2022-11-05 DIAGNOSIS — D6869 Other thrombophilia: Secondary | ICD-10-CM | POA: Diagnosis not present

## 2022-11-05 DIAGNOSIS — I480 Paroxysmal atrial fibrillation: Secondary | ICD-10-CM | POA: Diagnosis not present

## 2022-11-05 DIAGNOSIS — E785 Hyperlipidemia, unspecified: Secondary | ICD-10-CM | POA: Diagnosis not present

## 2022-11-05 DIAGNOSIS — Z952 Presence of prosthetic heart valve: Secondary | ICD-10-CM | POA: Diagnosis not present

## 2022-11-05 DIAGNOSIS — I10 Essential (primary) hypertension: Secondary | ICD-10-CM | POA: Diagnosis not present

## 2022-11-05 DIAGNOSIS — Z79899 Other long term (current) drug therapy: Secondary | ICD-10-CM

## 2022-11-05 DIAGNOSIS — I5020 Unspecified systolic (congestive) heart failure: Secondary | ICD-10-CM | POA: Diagnosis not present

## 2022-11-05 DIAGNOSIS — I2510 Atherosclerotic heart disease of native coronary artery without angina pectoris: Secondary | ICD-10-CM | POA: Diagnosis not present

## 2022-11-05 DIAGNOSIS — I330 Acute and subacute infective endocarditis: Secondary | ICD-10-CM | POA: Diagnosis not present

## 2022-11-05 DIAGNOSIS — I472 Ventricular tachycardia, unspecified: Secondary | ICD-10-CM | POA: Diagnosis not present

## 2022-11-05 MED ORDER — MEXILETINE HCL 200 MG PO CAPS: 200.0000 mg | ORAL_CAPSULE | Freq: Three times a day (TID) | ORAL | 11 refills | Status: DC

## 2022-11-05 NOTE — Addendum Note (Signed)
Addended by: Saddie Benders E on: 11/05/2022 01:27 PM   Modules accepted: Orders

## 2022-11-05 NOTE — Patient Instructions (Signed)
Medication Instructions:  No Changes In Medications at this time.  *If you need a refill on your cardiac medications before your next appointment, please call your pharmacy*  Lab Work: None Ordered At This Time.  If you have labs (blood work) drawn today and your tests are completely normal, you will receive your results only by: [address] (if you have MyChart) OR A paper copy in the mail If you have any lab test that is abnormal or we need to change your treatment, we will call you to review the results.  Testing/Procedures: Your physician has requested that you have an echocardiogram. Echocardiography is a painless test that uses sound waves to create images of your heart. It provides your doctor with information about the size and shape of your heart and how well your heart's chambers and valves are working. You may receive an ultrasound enhancing agent through an IV if needed to better visualize your heart during the echo.This procedure takes approximately one hour. There are no restrictions for this procedure. This will take place at the [address].   Follow-Up: At [facility], you and your health needs are our priority.  As part of our continuing mission to provide you with exceptional heart care, we have created designated Provider Care Teams.  These Care Teams include your primary Cardiologist (physician) and Advanced Practice Providers (APPs -  Physician Assistants and Nurse Practitioners) who all work together to provide you with the care you need, when you need it.  Your next appointment:   6 month(s)  The format for your next appointment:   In Person  Provider:   Elouise Munroe, MD

## 2022-11-05 NOTE — Addendum Note (Signed)
Addended by: Saddie Benders E on: 11/05/2022 01:26 PM   Modules accepted: Orders

## 2022-11-05 NOTE — Progress Notes (Signed)
Cardiology Office Note:    Date:  11/05/2022  ID:  Steve Mins., DOB April 20, 1929, MRN [id number]  PCP:  [name], MD  Cardiologist:  Elouise Munroe, MD  Electrophysiologist:  Vickie Epley, MD   Referring MD: [name], MD   Chief Complaint/Reason for Referral: Establish care; Mechanical AVR, MVR  History of Present Illness:    Steve Plush. is a 88 y.o. male followed by Dr. Wadie Lessen with [facility] who presents to transfer care locally.  Doing well overall.  Continues to have dizziness which affects balance issues but this has been several years, he ambulates with a cane and is cautious around his house.  We reviewed precautions for reducing risk of falls including no loose rugs or cords.  Denies chest discomfort or limiting shortness of breath.  He continues to mow the lawn on his ride mower, and is able to go shopping at [employer] though this does tire him out at the end of the day.  He has 72 great-grandchildren, youngest is almost 70-year-old.  We discussed repeating an echocardiogram given reduced EF in the setting of infection, to review ejection fraction.  We will also review his mechanical valves.  The sound excellent on exam and I suspect they will be well functioning.  History: I initially met patient in hospital during admission for bacteremia with need to evaluate for endocarditis. Patient presented to the ED on 03/27/22 complaining of nausea, vomiting, and weakness that started the night prior. Patient also had a fever with a max temp 101. Patient was admitted to the hospitalist service for treatment of suspected sepsis, acute respiratory failure with hypoxia. Infectious disease consulted, found patient to have high grade strep gallolyticus bacteremia.   He has a history of atrial flutter with CTI ablation 03/2021 at [facility]. Initial ECG in hospital most consistent with atrial fibrillation with CVR. During hospitalization, patient appeared to convert to  sinus rhythm. TEE was pursued for evaluation of endocarditis, and decision was made not to perform cardioversion given likely sinus rhythm. He remained in SR at his office visit with Dr. Wadie Lessen after hospitalization, and appears to be in SR today. He continues on amiodarone 200 mg daily.   In 2012, patient developed atrial fibrillation after he had surgery to repair a right groin AV fistula. Required cardioversion in 04/2019 and 11/2019. Holter monitor from 03/2020 showed no VT, but patient presented to the hospital on 09/25/20 with symptomatic VT, thought to be related to his active COVID-19 infection. Stabilized on procainamide, amiodarone, and beta blocker. He has been continued on mexiletine. Repeat monitor 09/2020 that showed 2-1 atrial flutter, sinus bradycardia. Continued to have symptomatic atrial flutter, had a successful cardioversion in November 2021. However, patient was found to be back in atrial flutter in February 2022 and patient underwent catheter ablation in April 2022. Due to patient's bradycardia, considered a pacemaker but ultimately decided to avoid due to prior endocarditis and mechanical valves.   History includes AVR and MVR with mechanical 21 mm St. Jude aortic valve and a mechanical 29 mm St. Jude mitral valve placed in 06/07/2004 due to endocarditis and is chronically on warfarin. Patient developed e.faecalis bacteremia in June 2021. TEE on May 26 2020 showed no evidence of vegetations, abscess, valvular regurgitation per report. Patient was treated empirically for mechanical valve endocarditis with a 6 week course of antibiotics. TEE during hospitalization 03/2022 showed no endocarditis.  He has a known history of CAD and his most recent LHC was  completed on 07/20/2008. Showed 30% disease in proximal RCA, normal Lcx, 40% disease in mid LAD.   Patient has a known history of chronic HFrEF felt to be nonischemic, echocardiogram on 08/23/20 showed EF 35%, moderate LVH, mild RV systolic  dysfunction. Given his reduced EF and his history of VT, ICD considered but deferred due to high risk of infection. More recently, echocardiogram in 01/10/2021 showed EF 45-50%, moderate LVH, mild RV systolic dysfunction performed at [facility]. Recent echo 03/28/22 showed significant drop in EF to 20-25%. We suspected this may be secondary to atrial fibrillation or acute illness and planned to recheck as outpatient. This was done with Dr. Wadie Lessen and showed EF 35%, calculated EF 32%.   Finally, history also includes HLD, HTN, secondary hyperparathyroidism.   At last visit with Dr. Wadie Lessen, bumex increased to home dose of 2 mg BID. Metoprolol succinate initiated, with intended dose of 12.5 mg, and increase to 25 mg given recent ED visit for tachypalpitations. Intended also for continued management of HFrEF. Follow up planned to eventually add losartan if tolerated. Will be difficult to titrate HF therapy in 89 yo.  Past Medical History:  Diagnosis Date   Anemia    Arthritis    Atrial fibrillation (HCC)    Diverticulosis    Endocarditis    GI bleed    Gout    Heart murmur    Hx of adenomatous colonic polyps    Hyperlipemia    Hyperparathyroidism ([place])    Hypertension    Nephrosclerosis    Sepsis ([place])     Past Surgical History:  Procedure Laterality Date   AORTIC AND MITRAL VALVE REPLACEMENT     CATARACT EXTRACTION Left    with lens implant   CORONARY ARTERY BYPASS GRAFT     MITRAL VALVE REPLACEMENT     ORIF ARM     TEE WITHOUT CARDIOVERSION N/A 04/03/2022   Procedure: TRANSESOPHAGEAL ECHOCARDIOGRAM (TEE);  Surgeon: Freada Bergeron, MD;  Location: [facility] ENDOSCOPY;  Service: Cardiovascular;  Laterality: N/A;    Current Medications: Current Meds  Medication Sig   acetaminophen (TYLENOL) 500 MG tablet Take 500 mg by mouth See admin instructions. 2- 3 times a day   allopurinol (ZYLOPRIM) 100 MG tablet Take 400 mg by mouth daily.   amiodarone (PACERONE) 200 MG tablet Take 200 mg by mouth  daily.   bumetanide (BUMEX) 2 MG tablet Take 1 tablet (2 mg total) by mouth daily with breakfast. (Patient taking differently: Take 2 mg by mouth 2 (two) times daily.)   calcitRIOL (ROCALTROL) 0.5 MCG capsule Take 0.5 mcg by mouth every other day.   calcium carbonate (TUMS - DOSED IN MG ELEMENTAL CALCIUM) 500 MG chewable tablet Chew 1-2 tablets by mouth as needed for indigestion or heartburn.   cetirizine (ZYRTEC) 10 MG tablet Take 10 mg by mouth at bedtime.   diclofenac Sodium (VOLTAREN) 1 % GEL Apply 2 g topically 4 (four) times daily as needed (to painful sites).   diphenhydrAMINE (BENADRYL) 25 MG tablet Take 25 mg by mouth at bedtime as needed (FOR BEE STING-RELATED ALLERGIC REACTIONS).   Glucosamine Sulfate 1000 MG TABS Take 1 tablet by mouth every evening.   methimazole (TAPAZOLE) 5 MG tablet Take 15 mg by mouth daily. TAKE 1 TABLET IN THE MORNING AND 2  TABLETAT NIGHT TIME   metoprolol succinate (TOPROL-XL) 25 MG 24 hr tablet Take 1 tablet (25 mg total) by mouth daily.   mexiletine (MEXITIL) 200 MG capsule Take 1 capsule (200 mg  total) by mouth in the morning, at noon, and at bedtime.   Multiple Vitamins-Minerals (CENTRUM SILVER 50+MEN) TABS Take 1 tablet by mouth daily with breakfast.   omeprazole (PRILOSEC) 20 MG capsule Take 1 capsule (20 mg total) by mouth daily.   potassium chloride SA (KLOR-CON M) 20 MEQ tablet Take 1 tablet (20 mEq total) by mouth daily. (Patient taking differently: Take 20 mEq by mouth 2 (two) times daily.)   ROLAIDS 550-110 MG CHEW Chew 1-2 tablets by mouth as needed (for heartburn).   simethicone (MYLICON) 016 MG chewable tablet Chew 125 mg by mouth every 6 (six) hours as needed for flatulence.   tamsulosin (FLOMAX) 0.4 MG CAPS capsule Take 0.8 mg by mouth at bedtime.   warfarin (COUMADIN) 3 MG tablet Take 1 tablet (3 mg total) by mouth daily at 4 PM. Follow up with repeat INR check 4/17.     Allergies:   Bee venom, Ace inhibitors, Atorvastatin, Pravastatin, and  Ranexa [ranolazine]   Social History   Tobacco Use   Smoking status: Former   Smokeless tobacco: Never  Scientific laboratory technician Use: Never used  Substance Use Topics   Alcohol use: No   Drug use: No     Family History: The patient's family history includes Breast cancer in his daughter; Hypertension in his father and mother; Liver cancer (age of onset: 15) in his brother; Stroke in his mother; Uterine cancer in his daughter.  ROS:   Please see the history of present illness.    All other systems reviewed and are negative.  EKGs/Labs/Other Studies Reviewed:    The following studies were reviewed today:  EKG:  NSR, inferior infarct pattern  Imaging studies that I have independently reviewed today: n/a  Recent Labs: 03/29/2022: Magnesium 2.2 04/26/2022: ALT 17; B Natriuretic Peptide 375.7; BUN 30; Creatinine, Ser 2.20; Hemoglobin 13.2; Platelets 182; Potassium 4.0; Sodium 142  Recent Lipid Panel No results found for: "CHOL", "TRIG", "HDL", "CHOLHDL", "VLDL", "LDLCALC", "LDLDIRECT"  Physical Exam:    VS:  BP 120/60   Pulse 92   Ht _0  (1.626 m)   Wt 188 lb (85.3 kg)   SpO2 99%   BMI 32.27 kg/m     Wt Readings from Last 5 Encounters:  11/05/22 188 lb (85.3 kg)  07/08/22 177 lb (80.3 kg)  05/13/22 183 lb (83 kg)  05/10/22 184 lb 6.4 oz (83.6 kg)  04/26/22 180 lb (81.6 kg)    Constitutional: No acute distress Eyes: sclera non-icteric, normal conjunctiva and lids ENMT: normal dentition, moist mucous membranes Cardiovascular: regular rhythm, normal rate, no murmurs.  Crisp mechanical S1 and S2, unchanged from last exam. No jugular venous distention.  Respiratory: clear to auscultation bilaterally GI : normal bowel sounds, soft and nontender. No distention.   MSK: extremities warm, well perfused. No edema.  NEURO: grossly nonfocal exam, moves all extremities. PSYCH: alert and oriented x 3, normal mood and affect.   ASSESSMENT:    1. Paroxysmal atrial fibrillation (HCC)    2. Secondary hypercoagulable state ([place])   3. Long term current use of antiarrhythmic drug   4. VT (ventricular tachycardia) (HCC)   5. HFrEF (heart failure with reduced ejection fraction) ([place])   6. Acute bacterial endocarditis   7. Hyperlipidemia, unspecified hyperlipidemia type   8. Primary hypertension   9. Coronary artery disease involving native coronary artery of native heart without angina pectoris   10. S/P MVR (mitral valve replacement)   11. S/P AVR  PLAN:    Paroxysmal atrial fibrillation ([place]) - Plan: EKG 12-Lead Secondary hypercoagulable state ([place]) Long term current use of antiarrhythmic drug Paroxysmal ventricular tachycardia ([place])  - following with EP for AAD: mexiletine, amiodarone - continues on amiodarone for atrial fibrillation and VT, continue.  - continue warfarin anticoagulation, INR followed by PCP - Metoprolol succinate 25 mg-he has been taking this as needed to maintain a higher heart rate, this is reasonable and he request to continue as needed dosing.  HFrEF - EF approximately 35% on Echo at [facility]. Would repeat in 3-6 mo to reassess after recovery from infection and maintenance of SR. we will repeat an echocardiogram next available to review EF. - previously described as nonischemic CM, last coronary assessment appears to be 2009.  - in the absence of chest pain or DOE, would recommend medical management, - not much room to add additional therapy due to BP and renal dysfunction.  Acute bacterial endocarditis -resolved, no recurrence of symptoms.  Hyperlipidemia, unspecified hyperlipidemia type - Total 189, LDL 118, HDL 32, Trig 196. Not currently on lipid lowering therapy. Recommend diet and lifestyle modification and can consider addition of statin if patient interested for nonobstructive CAD and secondary prevention.   Primary hypertension - BP normal, would not add additional therapy for BP at this time. On metoprolol succinate as needed.    Coronary artery disease involving native coronary artery of native heart without angina pectoris - mild and nonobstructive.  S/P MVR (mitral valve replacement) S/P AVR -warfarin anticoagulation, monitored by primary doctor. Review of EMR suggests prior GI bleed.  His INR was recently 2.1 and he was encouraged to adjust dose of warfarin, being managed by PCP. - normal function on recent TEE.   Total time of encounter: 30 minutes total time of encounter, including 20 minutes spent in face-to-face patient care on the date of this encounter. This time includes coordination of care and counseling regarding above mentioned problem list. Remainder of non-face-to-face time involved reviewing chart documents/testing relevant to the patient encounter and documentation in the medical record. I have independently reviewed documentation from referring provider.   Steve Kaiser, MD, [facility]    Medication Adjustments/Labs and Tests Ordered: Current medicines are reviewed at length with the patient today.  Concerns regarding medicines are outlined above.   Orders Placed This Encounter  Procedures   EKG 12-Lead   ECHOCARDIOGRAM COMPLETE    No orders of the defined types were placed in this encounter.   Patient Instructions  Medication Instructions:  No Changes In Medications at this time.  *If you need a refill on your cardiac medications before your next appointment, please call your pharmacy*  Lab Work: None Ordered At This Time.  If you have labs (blood work) drawn today and your tests are completely normal, you will receive your results only by: [place] (if you have MyChart) OR A paper copy in the mail If you have any lab test that is abnormal or we need to change your treatment, we will call you to review the results.  Testing/Procedures: Your physician has requested that you have an echocardiogram. Echocardiography is a painless test that uses sound  waves to create images of your heart. It provides your doctor with information about the size and shape of your heart and how well your heart's chambers and valves are working. You may receive an ultrasound enhancing agent through an IV if needed to better visualize your heart during the echo.This procedure  takes approximately one hour. There are no restrictions for this procedure. This will take place at the [address], [address].   Follow-Up: At [facility], you and your health needs are our priority.  As part of our continuing mission to provide you with exceptional heart care, we have created designated Provider Care Teams.  These Care Teams include your primary Cardiologist (physician) and Advanced Practice Providers (APPs -  Physician Assistants and Nurse Practitioners) who all work together to provide you with the care you need, when you need it.  Your next appointment:   6 month(s)  The format for your next appointment:   In Person  Provider:   Elouise Munroe, MD

## 2022-11-07 DIAGNOSIS — D6869 Other thrombophilia: Secondary | ICD-10-CM | POA: Diagnosis not present

## 2022-11-07 DIAGNOSIS — Z7901 Long term (current) use of anticoagulants: Secondary | ICD-10-CM | POA: Diagnosis not present

## 2022-11-07 DIAGNOSIS — Z952 Presence of prosthetic heart valve: Secondary | ICD-10-CM | POA: Diagnosis not present

## 2022-11-07 DIAGNOSIS — I480 Paroxysmal atrial fibrillation: Secondary | ICD-10-CM | POA: Diagnosis not present

## 2022-11-26 DIAGNOSIS — I359 Nonrheumatic aortic valve disorder, unspecified: Secondary | ICD-10-CM | POA: Diagnosis not present

## 2022-11-26 DIAGNOSIS — I480 Paroxysmal atrial fibrillation: Secondary | ICD-10-CM | POA: Diagnosis not present

## 2022-11-26 DIAGNOSIS — Z7901 Long term (current) use of anticoagulants: Secondary | ICD-10-CM | POA: Diagnosis not present

## 2022-11-26 DIAGNOSIS — Z952 Presence of prosthetic heart valve: Secondary | ICD-10-CM | POA: Diagnosis not present

## 2022-11-29 ENCOUNTER — Ambulatory Visit (HOSPITAL_COMMUNITY): Payer: Medicare Other | Attending: Cardiovascular Disease

## 2022-11-29 DIAGNOSIS — Z952 Presence of prosthetic heart valve: Secondary | ICD-10-CM | POA: Insufficient documentation

## 2022-11-29 LAB — ECHOCARDIOGRAM COMPLETE
AR max vel: 1.03 cm2
AV Area VTI: 1.18 cm2
AV Area mean vel: 0.96 cm2
AV Mean grad: 12 mmHg
AV Peak grad: 24.2 mmHg
Ao pk vel: 2.46 m/s
S' Lateral: 5 cm

## 2022-12-30 DIAGNOSIS — L905 Scar conditions and fibrosis of skin: Secondary | ICD-10-CM | POA: Diagnosis not present

## 2022-12-30 DIAGNOSIS — Z85828 Personal history of other malignant neoplasm of skin: Secondary | ICD-10-CM | POA: Diagnosis not present

## 2022-12-31 DIAGNOSIS — Z7901 Long term (current) use of anticoagulants: Secondary | ICD-10-CM | POA: Diagnosis not present

## 2022-12-31 DIAGNOSIS — Z952 Presence of prosthetic heart valve: Secondary | ICD-10-CM | POA: Diagnosis not present

## 2022-12-31 DIAGNOSIS — I480 Paroxysmal atrial fibrillation: Secondary | ICD-10-CM | POA: Diagnosis not present

## 2023-01-23 NOTE — Progress Notes (Signed)
Cardiology Office Note Date:  01/23/2023  Patient ID:  Steve Whitecotton., DOB [birth year], MRN [id number] PCP:  [name], MD  Cardiologist:  Dr. Margaretann Loveless (previously [place] system, Dr. Wadie Lessen) Electrophysiologist: Dr. Quentin Ore    Chief Complaint:  6 mo  History of Present Illness: Steve Turgeon. is a 89 y.o. male with history of VHD (mechanical AVR/MVR 2005), AFib/AFlutter (dates back to his valve surgery), NICM, chronic CHF (systolic), HTN, HLD, VT, CKD (IV)  Oct 2021, COVID infection, myocarditis, VT treated with procainamide, amiodarone, and beta blocker.  continued on mexiletine, amiodarone  CTI ablation 03/2021 at [facility]   April 2023, bacteremia/(?presumed) endocarditis (high grade strep gallolyticus bacteremia)  EF has waxed/waned, consideration for device discussed though with hx of bacteremia/(presumed?) endocarditis with mechanical valves, transvenous device (including pacer with some mention of bradycardia as well) has been avoided)  Had been following at [facility] though transitioned to Heart care for local care/management.  He saw Dr. Quentin Ore 07/08/22, stable without symptoms of arrhythmia, AAD continued, planned for 6 mo visits with EP.  Agreed not a candidate for ICD implant given bacteremia hx.  He saw Dr. Margaretann Loveless 11/05/22, stable, GDMT limited with renal function, no changes were made.  TODAY He is accompanied by his wife He overall feels like he is doing well Denies any CP, palpitations or cardiac awareness No rest SOB, no near syncope or syncope.  He has chronic (for years), lightheaded when ambulation. His wife mentions since COVID he legs have never recovered and his balance is poor, but he also reports an element of being lightgheaded/dizzy, especially with quick movements. He has not fallen And does feel like fainting It is not new or escalating  No bleeding or signs of bleeding  He follows his labs, thyroid, INR with his PMD Has been started on  methimazole via his PMD in August after labs done [facility]  07/30/22 BUN/Creat 35/2.02 K+4.3 AST 25 ALT 15 H/H 12.7/36.9 TSH <0.005 Free T4 2.47 (H)  He is fairly certain he has had more labs done since then.  He sees his nephrologist in a couple weeks, they also do labs   Past Medical History:  Diagnosis Date   Anemia    Arthritis    Atrial fibrillation ([place])    Diverticulosis    Endocarditis    GI bleed    Gout    Heart murmur    Hx of adenomatous colonic polyps    Hyperlipemia    Hyperparathyroidism ([place])    Hypertension    Nephrosclerosis    Sepsis ([place])     Past Surgical History:  Procedure Laterality Date   AORTIC AND MITRAL VALVE REPLACEMENT     CATARACT EXTRACTION Left    with lens implant   CORONARY ARTERY BYPASS GRAFT     MITRAL VALVE REPLACEMENT     ORIF ARM     TEE WITHOUT CARDIOVERSION N/A 04/03/2022   Procedure: TRANSESOPHAGEAL ECHOCARDIOGRAM (TEE);  Surgeon: Freada Bergeron, MD;  Location: [facility] ENDOSCOPY;  Service: Cardiovascular;  Laterality: N/A;    Current Outpatient Medications  Medication Sig Dispense Refill   acetaminophen (TYLENOL) 500 MG tablet Take 500 mg by mouth See admin instructions. 2- 3 times a day     allopurinol (ZYLOPRIM) 100 MG tablet Take 400 mg by mouth daily.     amiodarone (PACERONE) 200 MG tablet Take 200 mg by mouth daily.     bumetanide (BUMEX) 2 MG tablet Take 1 tablet (2 mg total) by  mouth daily with breakfast. (Patient taking differently: Take 2 mg by mouth 2 (two) times daily.) 30 tablet 0   calcitRIOL (ROCALTROL) 0.5 MCG capsule Take 0.5 mcg by mouth every other day.     calcium carbonate (TUMS - DOSED IN MG ELEMENTAL CALCIUM) 500 MG chewable tablet Chew 1-2 tablets by mouth as needed for indigestion or heartburn.     cetirizine (ZYRTEC) 10 MG tablet Take 10 mg by mouth at bedtime.     diclofenac Sodium (VOLTAREN) 1 % GEL Apply 2 g topically 4 (four) times daily as needed (to painful sites).     diphenhydrAMINE  (BENADRYL) 25 MG tablet Take 25 mg by mouth at bedtime as needed (FOR BEE STING-RELATED ALLERGIC REACTIONS).     Glucosamine Sulfate 1000 MG TABS Take 1 tablet by mouth every evening.     methimazole (TAPAZOLE) 5 MG tablet Take 15 mg by mouth daily. TAKE 1 TABLET IN THE MORNING AND 2  TABLETAT NIGHT TIME     metoprolol succinate (TOPROL-XL) 25 MG 24 hr tablet Take 1 tablet (25 mg total) by mouth daily. 90 tablet 3   mexiletine (MEXITIL) 200 MG capsule Take 1 capsule (200 mg total) by mouth in the morning, at noon, and at bedtime. 90 capsule 11   Multiple Vitamins-Minerals (CENTRUM SILVER 50+MEN) TABS Take 1 tablet by mouth daily with breakfast.     omeprazole (PRILOSEC) 20 MG capsule Take 1 capsule (20 mg total) by mouth daily. 30 capsule 11   potassium chloride SA (KLOR-CON M) 20 MEQ tablet Take 1 tablet (20 mEq total) by mouth daily. (Patient taking differently: Take 20 mEq by mouth 2 (two) times daily.) 30 tablet 0   ROLAIDS 550-110 MG CHEW Chew 1-2 tablets by mouth as needed (for heartburn).     simethicone (MYLICON) 161 MG chewable tablet Chew 125 mg by mouth every 6 (six) hours as needed for flatulence.     tamsulosin (FLOMAX) 0.4 MG CAPS capsule Take 0.8 mg by mouth at bedtime.     warfarin (COUMADIN) 3 MG tablet Take 1 tablet (3 mg total) by mouth daily at 4 PM. Follow up with repeat INR check 4/17. 30 tablet 0   No current facility-administered medications for this visit.    Allergies:   Bee venom, Ace inhibitors, Atorvastatin, Pravastatin, and Ranexa [ranolazine]   Social History:  The patient  reports that he has quit smoking. He has never used smokeless tobacco. He reports that he does not drink alcohol and does not use drugs.   Family History:  The patient's family history includes Breast cancer in his daughter; Hypertension in his father and mother; Liver cancer (age of onset: 80) in his brother; Stroke in his mother; Uterine cancer in his daughter.  ROS:  Please see the history  of present illness.    All other systems are reviewed and otherwise negative.   PHYSICAL EXAM:  VS:  There were no vitals taken for this visit. BMI: There is no height or weight on file to calculate BMI. Well nourished, well developed, in no acute distress HEENT: normocephalic, atraumatic Neck: no JVD, carotid bruits or masses Cardiac:   RRR; valves appreciated, no rubs, or gallops Lungs:   CTA b/l, no wheezing, rhonchi or rales Abd: soft, nontender MS: no deformity or atrophy Ext: b/l ankle edema L>R (reported as chronic and unchanged) Skin: warm and dry, no rash Neuro:  No gross deficits appreciated Psych: euthymic mood, full affect   EKG:  done today and  reviewed by myself SR/sinus arrhythmia, 88bpm, PAC  11/29/22: TTE 1. Left ventricular ejection fraction, by estimation, is 30 to 35%. The  left ventricle has moderately decreased function. The left ventricle  demonstrates regional wall motion abnormalities (see scoring  diagram/findings for description). The left  ventricular internal cavity size was mildly dilated. There is mild  concentric left ventricular hypertrophy. Left ventricular diastolic  function could not be evaluated. There is akinesis of the left  ventricular, basal-mid inferior wall and inferolateral  wall.   2. Right ventricular systolic function is low normal. The right  ventricular size is normal. There is normal pulmonary artery systolic  pressure. The estimated right ventricular systolic pressure is 35.3 mmHg.   3. Left atrial size was severely dilated.   4. Right atrial size was severely dilated.   5. The mitral valve has been repaired/replaced. No evidence of mitral  valve regurgitation. Mild mitral stenosis. The mean mitral valve gradient  is 6.8 mmHg with average heart rate of 85 bpm. There is a 29 mm St. Jude  mechanical valve present in the  mitral position.   6. The aortic valve has been repaired/replaced. Aortic valve  regurgitation is trivial.  There is a 21 mm St. Jude mechanical valve  present in the aortic position. Echo findings are consistent with normal  structure and function of the aortic valve  prosthesis. Aortic valve mean gradient measures 12.0 mmHg. Aortic valve  Vmax measures 2.46 m/s.   7. There is mild dilatation of the ascending aorta, measuring 43 mm.   8. The inferior vena cava is normal in size with greater than 50%  respiratory variability, suggesting right atrial pressure of 3 mmHg.   Comparison(s): A prior study was performed on 03/28/2022. Prior images  reviewed side by side. The left ventricular function is unchanged.  Retrospectively, LVEF may have been underestimated on the previous TTE.  Mitral gradients have increased slowly over  the last several years and there is now mild mitral stenosis.    04/03/22: TEE 1. No valvular vegetations visualized.   2. Left ventricular ejection fraction, by estimation, is 25 to 30%. The  left ventricle has severely decreased function. The left ventricular  internal cavity size was moderately dilated.   3. Right ventricular systolic function is mildly reduced. The right  ventricular size is normal.   4. The mitral valve has been repaired/replaced. There is a 29 mm St. Jude  mechanical valve present in the mitral position. Procedure Date: 2005.  Echo findings are consistent with normal structure and function of the  mitral valve prosthesis. There are  normal washing jets with trivial mitral regurgitation. Mean gradient 28mHg  at HR 88bpm, MVA 3.2cm2 by continuity.   5. The aortic valve has been repaired/replaced. Aortic valve  regurgitation is not visualized. There is a 21 mm St. Jude valve present  in the aortic position. Procedure Date: 2005. Aortic valve mean gradient  measures 12.0 mmHg. Aortic valve Vmax measures   2.43 m/s. DI 0.35.   6. Left atrial size was severely dilated. No left atrial/left atrial  appendage thrombus was detected.   7. Right atrial size  was mildly dilated.   8. Aortic dilatation noted. There is borderline dilatation of the aortic  root, measuring 38 mm. There is Moderate (Grade III) plaque.   Recent Labs: 03/29/2022: Magnesium 2.2 04/26/2022: ALT 17; B Natriuretic Peptide 375.7; BUN 30; Creatinine, Ser 2.20; Hemoglobin 13.2; Platelets 182; Potassium 4.0; Sodium 142  No results found for requested  labs within last 365 days.   CrCl cannot be calculated (Patient's most recent lab result is older than the maximum 21 days allowed.).   Wt Readings from Last 3 Encounters:  11/05/22 188 lb (85.3 kg)  07/08/22 177 lb (80.3 kg)  05/13/22 183 lb (83 kg)     Other studies reviewed: Additional studies/records reviewed today include: summarized above  ASSESSMENT AND PLAN:  NICM Chronic CHF Appears compensated currently C/w Dr. Margaretann Loveless and team CKD limits GDMT volume/CM management w/ Dr. Heidi Dach  VT No symptoms to suggest recurrence On mexiletine/amiodarone Advised avoidance/not a candidate for ICD given hx of bacteremia Advanced age  Hyperthyroidism started on methimazole > PMD is managing Will request f/u labs from his PMD Given hx and no ICD keeping the amiodarone and treating the thyroid is reasonable I will reach out to Dr. Quentin Ore for his input  Paroxysmal AFib/flutter CHA2DS2Vasc is 4 Chronic amiodarone Warfarin   VHD Mechanical AVR/MVR Stable function by his last echos On warfarin C/w dr. Margaretann Loveless   Secondary hypercoagulable state Warfarin is monitored and managed by his PMD   Disposition: F/u with EP again in 49mo sooner if needed  Current medicines are reviewed at length with the patient today.  The patient did not have any concerns regarding medicines.  SVenetia Night PA-C 01/23/2023 7:10 AM     [facility] [address] [place] [phone](office)  [phone](fax)

## 2023-01-24 ENCOUNTER — Ambulatory Visit: Payer: Medicare Other | Attending: Physician Assistant | Admitting: Physician Assistant

## 2023-01-24 ENCOUNTER — Encounter: Payer: Self-pay | Admitting: Physician Assistant

## 2023-01-24 VITALS — BP 122/64 | HR 90 | Ht 64.0 in | Wt 194.0 lb

## 2023-01-24 DIAGNOSIS — I480 Paroxysmal atrial fibrillation: Secondary | ICD-10-CM

## 2023-01-24 DIAGNOSIS — I428 Other cardiomyopathies: Secondary | ICD-10-CM | POA: Diagnosis not present

## 2023-01-24 DIAGNOSIS — Z952 Presence of prosthetic heart valve: Secondary | ICD-10-CM | POA: Diagnosis not present

## 2023-01-24 DIAGNOSIS — I472 Ventricular tachycardia, unspecified: Secondary | ICD-10-CM | POA: Diagnosis not present

## 2023-01-24 NOTE — Patient Instructions (Signed)
Medication Instructions:   Your physician recommends that you continue on your current medications as directed. Please refer to the Current Medication list given to you today.  *If you need a refill on your cardiac medications before your next appointment, please call your pharmacy*   Lab Work:  [place]   If you have labs (blood work) drawn today and your tests are completely normal, you will receive your results only by: [place] (if you have MyChart) OR A paper copy in the mail If you have any lab test that is abnormal or we need to change your treatment, we will call you to review the results.   Testing/Procedures: NONE ORDERED  TODAY    Follow-Up: At [facility], you and your health needs are our priority.  As part of our continuing mission to provide you with exceptional heart care, we have created designated Provider Care Teams.  These Care Teams include your primary Cardiologist (physician) and Advanced Practice Providers (APPs -  Physician Assistants and Nurse Practitioners) who all work together to provide you with the care you need, when you need it.  We recommend signing up for the patient portal called "MyChart".  Sign up information is provided on this After Visit Summary.  MyChart is used to connect with patients for Virtual Visits (Telemedicine).  Patients are able to view lab/test results, encounter notes, upcoming appointments, etc.  Non-urgent messages can be sent to your provider as well.   To learn more about what you can do with MyChart, go to [email or web address].    Your next appointment:   6 month(s)  Provider:   You may see Vickie Epley, MD or one of the following Advanced Practice Providers on your designated Care Team:   Tommye Standard, [place]  Other Instructions

## 2023-01-27 DIAGNOSIS — I4892 Unspecified atrial flutter: Secondary | ICD-10-CM | POA: Diagnosis not present

## 2023-01-27 DIAGNOSIS — N2581 Secondary hyperparathyroidism of renal origin: Secondary | ICD-10-CM | POA: Diagnosis not present

## 2023-01-27 DIAGNOSIS — I330 Acute and subacute infective endocarditis: Secondary | ICD-10-CM | POA: Diagnosis not present

## 2023-01-27 DIAGNOSIS — I4729 Other ventricular tachycardia: Secondary | ICD-10-CM | POA: Diagnosis not present

## 2023-01-27 DIAGNOSIS — I4891 Unspecified atrial fibrillation: Secondary | ICD-10-CM | POA: Diagnosis not present

## 2023-01-27 DIAGNOSIS — Z7901 Long term (current) use of anticoagulants: Secondary | ICD-10-CM | POA: Diagnosis not present

## 2023-01-27 DIAGNOSIS — D631 Anemia in chronic kidney disease: Secondary | ICD-10-CM | POA: Diagnosis not present

## 2023-01-27 DIAGNOSIS — E782 Mixed hyperlipidemia: Secondary | ICD-10-CM | POA: Diagnosis not present

## 2023-01-27 DIAGNOSIS — N184 Chronic kidney disease, stage 4 (severe): Secondary | ICD-10-CM | POA: Diagnosis not present

## 2023-01-27 DIAGNOSIS — M109 Gout, unspecified: Secondary | ICD-10-CM | POA: Diagnosis not present

## 2023-01-27 DIAGNOSIS — I129 Hypertensive chronic kidney disease with stage 1 through stage 4 chronic kidney disease, or unspecified chronic kidney disease: Secondary | ICD-10-CM | POA: Diagnosis not present

## 2023-01-27 DIAGNOSIS — I429 Cardiomyopathy, unspecified: Secondary | ICD-10-CM | POA: Diagnosis not present

## 2023-01-27 NOTE — Addendum Note (Signed)
Addended by: Daiva Huge on: 01/27/2023 03:19 PM   Modules accepted: Orders

## 2023-02-04 DIAGNOSIS — Z952 Presence of prosthetic heart valve: Secondary | ICD-10-CM | POA: Diagnosis not present

## 2023-02-04 DIAGNOSIS — I359 Nonrheumatic aortic valve disorder, unspecified: Secondary | ICD-10-CM | POA: Diagnosis not present

## 2023-02-04 DIAGNOSIS — I480 Paroxysmal atrial fibrillation: Secondary | ICD-10-CM | POA: Diagnosis not present

## 2023-02-04 DIAGNOSIS — Z7901 Long term (current) use of anticoagulants: Secondary | ICD-10-CM | POA: Diagnosis not present

## 2023-02-11 ENCOUNTER — Telehealth: Payer: Self-pay | Admitting: Cardiology

## 2023-02-11 MED ORDER — AMIODARONE HCL 200 MG PO TABS: 200.0000 mg | ORAL_TABLET | Freq: Every day | ORAL | 3 refills | Status: DC

## 2023-02-11 NOTE — Telephone Encounter (Signed)
Pt's medication was sent to pt's pharmacy as requested. Confirmation received.  °

## 2023-02-11 NOTE — Telephone Encounter (Signed)
*  STAT* If patient is at the pharmacy, call can be transferred to refill team.   1. Which medications need to be refilled? (please list name of each medication and dose if known)  amiodarone (PACERONE) 200 MG tablet  2. Which pharmacy/location (including street and city if local pharmacy) is medication to be sent to? [place], [address]  3. Do they need a 30 day or 90 day supply?  90 day supply  Patient states he is almost out of medication.

## 2023-03-06 DIAGNOSIS — R972 Elevated prostate specific antigen [PSA]: Secondary | ICD-10-CM | POA: Diagnosis not present

## 2023-03-13 DIAGNOSIS — R3912 Poor urinary stream: Secondary | ICD-10-CM | POA: Diagnosis not present

## 2023-03-13 DIAGNOSIS — N401 Enlarged prostate with lower urinary tract symptoms: Secondary | ICD-10-CM | POA: Diagnosis not present

## 2023-03-13 DIAGNOSIS — R972 Elevated prostate specific antigen [PSA]: Secondary | ICD-10-CM | POA: Diagnosis not present

## 2023-03-17 DIAGNOSIS — Z7901 Long term (current) use of anticoagulants: Secondary | ICD-10-CM | POA: Diagnosis not present

## 2023-03-17 DIAGNOSIS — Z952 Presence of prosthetic heart valve: Secondary | ICD-10-CM | POA: Diagnosis not present

## 2023-03-17 DIAGNOSIS — I480 Paroxysmal atrial fibrillation: Secondary | ICD-10-CM | POA: Diagnosis not present

## 2023-04-22 DIAGNOSIS — Z7901 Long term (current) use of anticoagulants: Secondary | ICD-10-CM | POA: Diagnosis not present

## 2023-04-22 DIAGNOSIS — R3589 Other polyuria: Secondary | ICD-10-CM | POA: Diagnosis not present

## 2023-04-22 DIAGNOSIS — Z952 Presence of prosthetic heart valve: Secondary | ICD-10-CM | POA: Diagnosis not present

## 2023-04-22 DIAGNOSIS — I480 Paroxysmal atrial fibrillation: Secondary | ICD-10-CM | POA: Diagnosis not present

## 2023-04-22 DIAGNOSIS — R42 Dizziness and giddiness: Secondary | ICD-10-CM | POA: Diagnosis not present

## 2023-04-22 DIAGNOSIS — E0580 Other thyrotoxicosis without thyrotoxic crisis or storm: Secondary | ICD-10-CM | POA: Diagnosis not present

## 2023-04-29 ENCOUNTER — Ambulatory Visit: Payer: Medicare Other | Attending: Internal Medicine | Admitting: Internal Medicine

## 2023-04-29 ENCOUNTER — Encounter: Payer: Self-pay | Admitting: Internal Medicine

## 2023-04-29 VITALS — BP 115/73 | HR 86 | Ht 64.0 in | Wt 202.2 lb

## 2023-04-29 DIAGNOSIS — I10 Essential (primary) hypertension: Secondary | ICD-10-CM | POA: Diagnosis not present

## 2023-04-29 DIAGNOSIS — I5020 Unspecified systolic (congestive) heart failure: Secondary | ICD-10-CM | POA: Diagnosis not present

## 2023-04-29 DIAGNOSIS — Z79899 Other long term (current) drug therapy: Secondary | ICD-10-CM | POA: Diagnosis not present

## 2023-04-29 DIAGNOSIS — Z952 Presence of prosthetic heart valve: Secondary | ICD-10-CM | POA: Diagnosis not present

## 2023-04-29 DIAGNOSIS — I2510 Atherosclerotic heart disease of native coronary artery without angina pectoris: Secondary | ICD-10-CM | POA: Insufficient documentation

## 2023-04-29 DIAGNOSIS — I428 Other cardiomyopathies: Secondary | ICD-10-CM | POA: Diagnosis not present

## 2023-04-29 DIAGNOSIS — D6869 Other thrombophilia: Secondary | ICD-10-CM | POA: Diagnosis not present

## 2023-04-29 DIAGNOSIS — E785 Hyperlipidemia, unspecified: Secondary | ICD-10-CM | POA: Diagnosis not present

## 2023-04-29 DIAGNOSIS — I472 Ventricular tachycardia, unspecified: Secondary | ICD-10-CM | POA: Insufficient documentation

## 2023-04-29 DIAGNOSIS — I480 Paroxysmal atrial fibrillation: Secondary | ICD-10-CM | POA: Insufficient documentation

## 2023-04-29 DIAGNOSIS — I330 Acute and subacute infective endocarditis: Secondary | ICD-10-CM | POA: Insufficient documentation

## 2023-04-29 NOTE — Progress Notes (Signed)
Cardiology Office Note:    Date:  04/29/2023  ID:  Steve Baldy., DOB 01-03-1929, MRN [id number]  PCP:  Steve Fillers, MD  Cardiologist:  Steve Poisson, MD  Electrophysiologist:  Steve Prude, MD   Referring MD: Steve Fillers, MD   Chief Complaint/Reason for Referral: Mechanical AVR, MVR, HFrEF  History of Present Illness:    Steve Riddle. is a 89 y.o. male who I follow for heart failure with reduced ejection fraction felt to be nonischemic and mechanical AVR/MVR.  04/29/2023: Since her last visit he has been diagnosed with hyperthyroidism and has been on methimazole.  Laboratory studies reviewed, TSH is 60, this is being actively managed by primary care and he has been instructed to remain on methimazole.  This is also been reviewed by electrophysiology as he continues on amiodarone at their visit in February, and was felt in shared decision making to be the better option to remain on amiodarone.  He does note some increased weakness especially over the last 3 weeks.  When diagnosed with hyperthyroidism he had lost weight, and with rectifying the hyperthyroidism he has gained the weight back and then some.  He does not have increased shortness of breath with activity and continues to mow his lawn with a ride mower and continues to cook for the family.  He denies any lower extremity edema.  His symptoms of weakness and fatigue do not sound like decompensated heart failure.  Reviewed echocardiogram which was overall stable.  Prior visits:  Doing well overall.  Continues to have dizziness which affects balance issues but this has been several years, he ambulates with a cane and is cautious around his house.  We reviewed precautions for reducing risk of falls including no loose rugs or cords.  Denies chest discomfort or limiting shortness of breath.  He continues to mow the lawn on his ride mower, and is able to go shopping at [employer] though this does tire him out at the  end of the day.  He has 67 great-grandchildren, youngest is almost 70-year-old.  We discussed repeating an echocardiogram given reduced EF in the setting of infection, to review ejection fraction.  We will also review his mechanical valves.  The sound excellent on exam and I suspect they will be well functioning.  History: I initially met patient in hospital during admission for bacteremia with need to evaluate for endocarditis. Patient presented to the ED on 03/27/22 complaining of nausea, vomiting, and weakness that started the night prior. Patient also had a fever with a max temp 101. Patient was admitted to the hospitalist service for treatment of suspected sepsis, acute respiratory failure with hypoxia. Infectious disease consulted, found patient to have high grade strep gallolyticus bacteremia.   He has a history of atrial flutter with CTI ablation 03/2021 at [facility]. Initial ECG in hospital most consistent with atrial fibrillation with CVR. During hospitalization, patient appeared to convert to sinus rhythm. TEE was pursued for evaluation of endocarditis, and decision was made not to perform cardioversion given likely sinus rhythm. He remained in SR at his office visit with Dr. Jacqlyn Riddle after hospitalization, and appears to be in SR today. He continues on amiodarone 200 mg daily.   In 2012, patient developed atrial fibrillation after he had surgery to repair a right groin AV fistula. Required cardioversion in 04/2019 and 11/2019. Holter monitor from 03/2020 showed no VT, but patient presented to the hospital on 09/25/20 with symptomatic VT, thought to be related to  his active COVID-19 infection. Stabilized on procainamide, amiodarone, and beta blocker. He has been continued on mexiletine. Repeat monitor 09/2020 that showed 2-1 atrial flutter, sinus bradycardia. Continued to have symptomatic atrial flutter, had a successful cardioversion in November 2021. However, patient was found to be back in atrial flutter in  February 2022 and patient underwent catheter ablation in April 2022. Due to patient's bradycardia, considered a pacemaker but ultimately decided to avoid due to prior endocarditis and mechanical valves.   History includes AVR and MVR with mechanical 21 mm St. Jude aortic valve and a mechanical 29 mm St. Jude mitral valve placed in 06/07/2004 due to endocarditis and is chronically on warfarin. Patient developed e.faecalis bacteremia in June 2021. TEE on May 26 2020 showed no evidence of vegetations, abscess, valvular regurgitation per report. Patient was treated empirically for mechanical valve endocarditis with a 6 week course of antibiotics. TEE during hospitalization 03/2022 showed no endocarditis.  He has a known history of CAD and his most recent LHC was completed on 07/20/2008. Showed 30% disease in proximal RCA, normal Lcx, 40% disease in mid LAD.   Patient has a known history of chronic HFrEF felt to be nonischemic, echocardiogram on 08/23/20 showed EF 35%, moderate LVH, mild RV systolic dysfunction. Given his reduced EF and his history of VT, ICD considered but deferred due to high risk of infection. More recently, echocardiogram in 01/10/2021 showed EF 45-50%, moderate LVH, mild RV systolic dysfunction performed at [facility]. Recent echo 03/28/22 showed significant drop in EF to 20-25%. We suspected this may be secondary to atrial fibrillation or acute illness and planned to recheck as outpatient. This was done with Dr. Jacqlyn Riddle and showed EF 35%, calculated EF 32%.   Finally, history also includes HLD, HTN, secondary hyperparathyroidism.   At last visit with Dr. Jacqlyn Riddle, bumex increased to home dose of 2 mg BID. Metoprolol succinate initiated, with intended dose of 12.5 mg, and increase to 25 mg given recent ED visit for tachypalpitations. Intended also for continued management of HFrEF. Follow up planned to eventually add losartan if tolerated. Will be difficult to titrate HF therapy in 89 yo.  Past Medical  History:  Diagnosis Date   Anemia    Arthritis    Atrial fibrillation (HCC)    Diverticulosis    Endocarditis    GI bleed    Gout    Heart murmur    Hx of adenomatous colonic polyps    Hyperlipemia    Hyperparathyroidism (HCC)    Hypertension    Nephrosclerosis    Sepsis (HCC)     Past Surgical History:  Procedure Laterality Date   AORTIC AND MITRAL VALVE REPLACEMENT     CATARACT EXTRACTION Left    with lens implant   CORONARY ARTERY BYPASS GRAFT     MITRAL VALVE REPLACEMENT     ORIF ARM     TEE WITHOUT CARDIOVERSION N/A 04/03/2022   Procedure: TRANSESOPHAGEAL ECHOCARDIOGRAM (TEE);  Surgeon: Meriam Sprague, MD;  Location: [facility] ENDOSCOPY;  Service: Cardiovascular;  Laterality: N/A;    Current Medications: Current Meds  Medication Sig   acetaminophen (TYLENOL) 500 MG tablet Take 500 mg by mouth See admin instructions. 2- 3 times a day   allopurinol (ZYLOPRIM) 100 MG tablet Take 400 mg by mouth daily.   amiodarone (PACERONE) 200 MG tablet Take 1 tablet (200 mg total) by mouth daily.   bumetanide (BUMEX) 2 MG tablet Take 1 tablet (2 mg total) by mouth daily with breakfast. (Patient taking differently:  Take 2 mg by mouth 2 (two) times daily.)   calcitRIOL (ROCALTROL) 0.5 MCG capsule Take 0.5 mcg by mouth every other day.   calcium carbonate (TUMS - DOSED IN MG ELEMENTAL CALCIUM) 500 MG chewable tablet Chew 1-2 tablets by mouth as needed for indigestion or heartburn.   cetirizine (ZYRTEC) 10 MG tablet Take 10 mg by mouth at bedtime.   diclofenac Sodium (VOLTAREN) 1 % GEL Apply 2 g topically 4 (four) times daily as needed (to painful sites).   diphenhydrAMINE (BENADRYL) 25 MG tablet Take 25 mg by mouth at bedtime as needed (FOR BEE STING-RELATED ALLERGIC REACTIONS).   Glucosamine Sulfate 1000 MG TABS Take 1 tablet by mouth every evening.   methimazole (TAPAZOLE) 5 MG tablet Take 15 mg by mouth daily. TAKE 1 TABLET IN THE MORNING AND 2  TABLETAT NIGHT TIME   metoprolol  succinate (TOPROL-XL) 25 MG 24 hr tablet Take by mouth daily. PT TAKES A HALF TABLET DAILY   mexiletine (MEXITIL) 200 MG capsule Take 1 capsule (200 mg total) by mouth in the morning, at noon, and at bedtime.   Multiple Vitamins-Minerals (CENTRUM SILVER 50+MEN) TABS Take 1 tablet by mouth daily with breakfast.   omeprazole (PRILOSEC) 20 MG capsule Take 1 capsule (20 mg total) by mouth daily.   potassium chloride SA (KLOR-CON M) 20 MEQ tablet Take 1 tablet (20 mEq total) by mouth daily. (Patient taking differently: Take 20 mEq by mouth 2 (two) times daily.)   ROLAIDS 550-110 MG CHEW Chew 1-2 tablets by mouth as needed (for heartburn).   simethicone (MYLICON) 125 MG chewable tablet Chew 125 mg by mouth every 6 (six) hours as needed for flatulence.   tamsulosin (FLOMAX) 0.4 MG CAPS capsule Take 0.8 mg by mouth at bedtime.   warfarin (COUMADIN) 3 MG tablet Take 1 tablet (3 mg total) by mouth daily at 4 PM. Follow up with repeat INR check 4/17.     Allergies:   Bee venom, Ace inhibitors, Atorvastatin, Pravastatin, and Ranexa [ranolazine]   Social History   Tobacco Use   Smoking status: Former   Smokeless tobacco: Never  Building services engineer Use: Never used  Substance Use Topics   Alcohol use: No   Drug use: No     Family History: The patient's family history includes Breast cancer in his daughter; Hypertension in his father and mother; Liver cancer (age of onset: 24) in his brother; Stroke in his mother; Uterine cancer in his daughter.  ROS:   Please see the history of present illness.    All other systems reviewed and are negative.  EKGs/Labs/Other Studies Reviewed:    The following studies were reviewed today:  EKG: Sinus rhythm with low P wave voltage.  Incomplete left bundle branch block, nonspecific ST-T wave abnormality.  Imaging studies that I have independently reviewed today: n/a  Recent Labs: No results found for requested labs within last 365 days.  Recent Lipid  Panel No results found for: "CHOL", "TRIG", "HDL", "CHOLHDL", "VLDL", "LDLCALC", "LDLDIRECT"  Physical Exam:    VS:  BP 115/73   Pulse 86   Ht 5\' 4"  (1.626 m)   Wt 202 lb 3.2 oz (91.7 kg)   SpO2 100%   BMI 34.71 kg/m     Wt Readings from Last 5 Encounters:  04/29/23 202 lb 3.2 oz (91.7 kg)  01/24/23 194 lb (88 kg)  11/05/22 188 lb (85.3 kg)  07/08/22 177 lb (80.3 kg)  05/13/22 183 lb (83 kg)  Constitutional: No acute distress Eyes: sclera non-icteric, normal conjunctiva and lids ENMT: normal dentition, moist mucous membranes Cardiovascular: regular rhythm, normal rate, no murmurs.  Crisp mechanical S1 and S2, unchanged from last exam. No jugular venous distention.  Respiratory: clear to auscultation bilaterally GI : normal bowel sounds, soft and nontender. No distention.   MSK: extremities warm, well perfused. No edema.  NEURO: grossly nonfocal exam, moves all extremities. PSYCH: alert and oriented x 3, normal mood and affect.   ASSESSMENT:    1. HFrEF (heart failure with reduced ejection fraction) (HCC)   2. S/P AVR   3. S/P MVR (mitral valve replacement)   4. VT (ventricular tachycardia) (HCC)   5. Paroxysmal atrial fibrillation (HCC)   6. NICM (nonischemic cardiomyopathy) (HCC)   7. Long term current use of antiarrhythmic drug   8. Secondary hypercoagulable state (HCC)   9. Acute bacterial endocarditis   10. Hyperlipidemia, unspecified hyperlipidemia type   11. Primary hypertension   12. Coronary artery disease involving native coronary artery of native heart without angina pectoris      PLAN:    Paroxysmal atrial fibrillation (HCC) - Plan: EKG 12-Lead Secondary hypercoagulable state (HCC) Long term current use of antiarrhythmic drug Paroxysmal ventricular tachycardia (HCC)  - following with EP for AAD: mexiletine, amiodarone.  Amiodarone reviewed in light of his hyperthyroidism and felt to be appropriate to continue by EP. - continues on amiodarone for  atrial fibrillation and VT, continue.  - continue warfarin anticoagulation, INR followed by PCP, most recent INR 2.9. - Metoprolol succinate 25 mg, he did have some palpitations when off of metoprolol and we have decided in shared decision making to continue this medication despite some orthostasis.  I have instructed the patient to obtain compression socks which I think will help.  HFrEF - EF approximately 30 to 35% - previously described as nonischemic CM, last coronary assessment appears to be 2009.  - in the absence of chest pain or DOE, would recommend medical management, - not much room to add additional therapy due to BP and renal dysfunction.  Acute bacterial endocarditis -resolved, no recurrence of symptoms.  Hyperlipidemia, unspecified hyperlipidemia type -lipids have improved with an LDL of 80 in June 2023, triglycerides remain elevated.  Recommend diet and lifestyle modification and can consider addition of statin if patient interested for nonobstructive CAD and secondary prevention.   Primary hypertension - BP normal, would not add additional therapy for BP at this time. On metoprolol succinate as needed.   Coronary artery disease involving native coronary artery of native heart without angina pectoris - mild and nonobstructive.  S/P MVR (mitral valve replacement) S/P AVR -warfarin anticoagulation, monitored by primary doctor. Review of EMR suggests prior GI bleed.  His INR was recently 2.1 and he was encouraged to adjust dose of warfarin, being managed by PCP. - normal function on TEE and TTE  Total time of encounter: 30 minutes total time of encounter, including 20 minutes spent in face-to-face patient care on the date of this encounter. This time includes coordination of care and counseling regarding above mentioned problem list. Remainder of non-face-to-face time involved reviewing chart documents/testing relevant to the patient encounter and documentation in the medical  record. I have independently reviewed documentation from referring provider.   Steve Brass, MD, [facility]  [facility]    Medication Adjustments/Labs and Tests Ordered: Current medicines are reviewed at length with the patient today.  Concerns regarding medicines are outlined above.   Orders Placed This  Encounter  Procedures   EKG 12-Lead   ECHOCARDIOGRAM COMPLETE    No orders of the defined types were placed in this encounter.   Patient Instructions  Medication Instructions:  No Changes In Medications at this time.   *If you need a refill on your cardiac medications before your next appointment, please call your pharmacy*  Testing/Procedures: Your physician has requested that you have an echocardiogram IN 6 MONTHS. Echocardiography is a painless test that uses sound waves to create images of your heart. It provides your doctor with information about the size and shape of your heart and how well your heart's chambers and valves are working. You may receive an ultrasound enhancing agent through an IV if needed to better visualize your heart during the echo.This procedure takes approximately one hour. There are no restrictions for this procedure. This will take place at the [address].    Follow-Up: At [facility], you and your health needs are our priority.  As part of our continuing mission to provide you with exceptional heart care, we have created designated Provider Care Teams.  These Care Teams include your primary Cardiologist (physician) and Advanced Practice Providers (APPs -  Physician Assistants and Nurse Practitioners) who all work together to provide you with the care you need, when you need it.  Your next appointment:   6 month(s) RIGHT AFTER ECHO  Provider:   Parke Poisson, MD

## 2023-04-29 NOTE — Patient Instructions (Signed)
Medication Instructions:  No Changes In Medications at this time.   *If you need a refill on your cardiac medications before your next appointment, please call your pharmacy*  Testing/Procedures: Your physician has requested that you have an echocardiogram IN 6 MONTHS. Echocardiography is a painless test that uses sound waves to create images of your heart. It provides your doctor with information about the size and shape of your heart and how well your heart's chambers and valves are working. You may receive an ultrasound enhancing agent through an IV if needed to better visualize your heart during the echo.This procedure takes approximately one hour. There are no restrictions for this procedure. This will take place at the [address], [address].    Follow-Up: At [facility], you and your health needs are our priority.  As part of our continuing mission to provide you with exceptional heart care, we have created designated Provider Care Teams.  These Care Teams include your primary Cardiologist (physician) and Advanced Practice Providers (APPs -  Physician Assistants and Nurse Practitioners) who all work together to provide you with the care you need, when you need it.  Your next appointment:   6 month(s) RIGHT AFTER ECHO  Provider:   Parke Poisson, MD

## 2023-04-30 DIAGNOSIS — H43812 Vitreous degeneration, left eye: Secondary | ICD-10-CM | POA: Diagnosis not present

## 2023-04-30 DIAGNOSIS — H2511 Age-related nuclear cataract, right eye: Secondary | ICD-10-CM | POA: Diagnosis not present

## 2023-04-30 DIAGNOSIS — H26492 Other secondary cataract, left eye: Secondary | ICD-10-CM | POA: Diagnosis not present

## 2023-04-30 DIAGNOSIS — H5213 Myopia, bilateral: Secondary | ICD-10-CM | POA: Diagnosis not present

## 2023-04-30 DIAGNOSIS — H25011 Cortical age-related cataract, right eye: Secondary | ICD-10-CM | POA: Diagnosis not present

## 2023-04-30 DIAGNOSIS — H18003 Unspecified corneal deposit, bilateral: Secondary | ICD-10-CM | POA: Diagnosis not present

## 2023-04-30 DIAGNOSIS — Z961 Presence of intraocular lens: Secondary | ICD-10-CM | POA: Diagnosis not present

## 2023-04-30 DIAGNOSIS — H524 Presbyopia: Secondary | ICD-10-CM | POA: Diagnosis not present

## 2023-04-30 DIAGNOSIS — H3589 Other specified retinal disorders: Secondary | ICD-10-CM | POA: Diagnosis not present

## 2023-05-29 IMAGING — DX DG CHEST 1V PORT
1 series · 1 of 1 positions shown · non-contrast
Comparison: Prior chest radiographs 08/22/2020 and earlier.

CLINICAL DATA: Provided history: Questionable sepsis-evaluate
abnormality. Nausea/vomiting, weakness. Fever.

EXAM:
PORTABLE CHEST 1 VIEW

[chest ap]
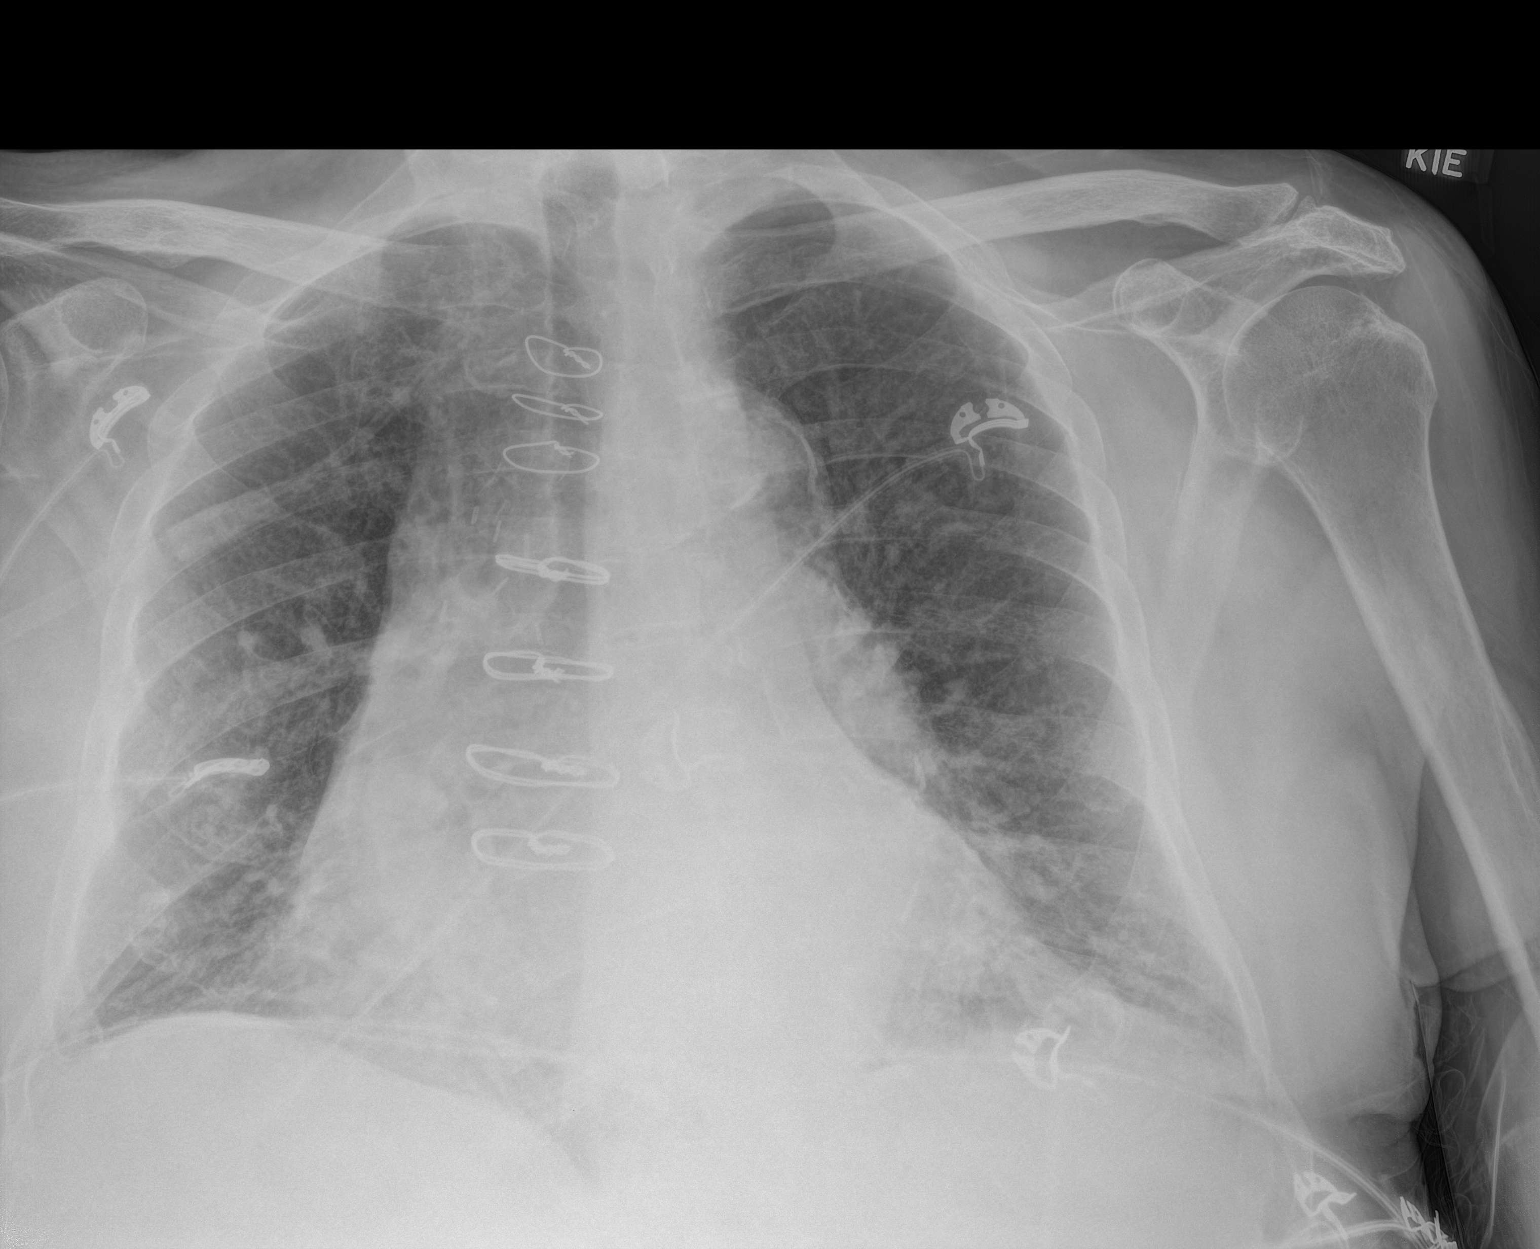

[1 of 1 positions shown; findings below may reference images not displayed]

FINDINGS: Prior median sternotomy. Cardiomegaly. Aortic atherosclerosis. Mild
ill-defined opacity within the bilateral lung bases. No evidence of
pleural effusion or pneumothorax. No acute bony abnormality
identified.
IMPRESSION: Mild ill-defined opacity within the bilateral lung bases. This may
reflect atelectasis, however, pneumonia cannot be excluded.

Cardiomegaly.

Aortic Atherosclerosis ([id number]).

## 2023-06-02 DIAGNOSIS — Z952 Presence of prosthetic heart valve: Secondary | ICD-10-CM | POA: Diagnosis not present

## 2023-06-02 DIAGNOSIS — I480 Paroxysmal atrial fibrillation: Secondary | ICD-10-CM | POA: Diagnosis not present

## 2023-06-02 DIAGNOSIS — Z7901 Long term (current) use of anticoagulants: Secondary | ICD-10-CM | POA: Diagnosis not present

## 2023-06-02 DIAGNOSIS — E0580 Other thyrotoxicosis without thyrotoxic crisis or storm: Secondary | ICD-10-CM | POA: Diagnosis not present

## 2023-06-03 ENCOUNTER — Other Ambulatory Visit (HOSPITAL_COMMUNITY): Payer: Medicare Other

## 2023-07-09 DIAGNOSIS — H2511 Age-related nuclear cataract, right eye: Secondary | ICD-10-CM | POA: Diagnosis not present

## 2023-07-09 DIAGNOSIS — H5703 Miosis: Secondary | ICD-10-CM | POA: Diagnosis not present

## 2023-07-09 DIAGNOSIS — H25811 Combined forms of age-related cataract, right eye: Secondary | ICD-10-CM | POA: Diagnosis not present

## 2023-07-14 DIAGNOSIS — Z7901 Long term (current) use of anticoagulants: Secondary | ICD-10-CM | POA: Diagnosis not present

## 2023-07-14 DIAGNOSIS — E0580 Other thyrotoxicosis without thyrotoxic crisis or storm: Secondary | ICD-10-CM | POA: Diagnosis not present

## 2023-07-14 DIAGNOSIS — R42 Dizziness and giddiness: Secondary | ICD-10-CM | POA: Diagnosis not present

## 2023-07-14 DIAGNOSIS — I480 Paroxysmal atrial fibrillation: Secondary | ICD-10-CM | POA: Diagnosis not present

## 2023-07-14 DIAGNOSIS — E785 Hyperlipidemia, unspecified: Secondary | ICD-10-CM | POA: Diagnosis not present

## 2023-07-14 DIAGNOSIS — Z952 Presence of prosthetic heart valve: Secondary | ICD-10-CM | POA: Diagnosis not present

## 2023-07-14 DIAGNOSIS — R946 Abnormal results of thyroid function studies: Secondary | ICD-10-CM | POA: Diagnosis not present

## 2023-07-17 DIAGNOSIS — Z961 Presence of intraocular lens: Secondary | ICD-10-CM | POA: Diagnosis not present

## 2023-08-01 NOTE — Progress Notes (Unsigned)
Cardiology Office Note Date:  08/01/2023  Patient ID:  Steve Mine., DOB 25-Jan-1929, MRN [id number] PCP:  Garlan Fillers, MD  Cardiologist:  Dr. Jacques Navy (previously [facility], Dr. Jacqlyn Krauss) Electrophysiologist: Dr. Lalla Brothers    Chief Complaint:  *** 6 mo  History of Present Illness: Steve Riddle. is a 89 y.o. male with history of VHD (mechanical AVR/MVR 2005), AFib/AFlutter (dates back to his valve surgery), NICM, chronic CHF (systolic), HTN, HLD, VT, CKD (IV)  Oct 2021, COVID infection, myocarditis, VT treated with procainamide, amiodarone, and beta blocker.  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  He saw Dr. Lalla Brothers 07/08/22, stable without symptoms of arrhythmia, AAD continued, planned for 6 mo visits with EP.  Agreed not a candidate for ICD implant given bacteremia hx.  He saw Dr. Jacques Navy 11/05/22, stable, GDMT limited with renal function, no changes were made.  I saw him 01/24/23 He is accompanied by his wife He overall feels like he is doing well Denies any CP, palpitations or cardiac awareness No rest SOB, no near syncope or syncope.  He has chronic (for years), lightheaded when ambulation. His wife mentions since COVID he legs have never recovered and his balance is poor, but he also reports an element of being lightgheaded/dizzy, especially with quick movements. He has not fallen And does feel like fainting It is not new or escalating  No bleeding or signs of bleeding  He follows his labs, thyroid, INR with his PMD Has been  started on methimazole via his PMD in August after labs done [facility]  07/30/22 BUN/Creat 35/2.02 K+4.3 AST 25 ALT 15 H/H 12.7/36.9 TSH <0.005 Free T4 2.47 (H)  He is fairly certain he has had more labs done since then. He sees his nephrologist in a couple weeks, they also do labs No changes were made  He saw Dr. Lucita Lora 04/29/23, c/o some generalized weakness/fatigue, not felt to be volume OL. No changes were made.  *** symptoms of arrhythmia? Syncope?  AF? *** warfarin, bleeding, PMD *** amio labs, LFTs, ?pulm >> PMD for TSH     Past Medical History:  Diagnosis Date   Anemia    Arthritis    Atrial fibrillation (HCC)    Diverticulosis    Endocarditis    GI bleed    Gout    Heart murmur    Hx of adenomatous colonic polyps    Hyperlipemia    Hyperparathyroidism (HCC)    Hypertension    Nephrosclerosis    Sepsis (HCC)     Past Surgical History:  Procedure Laterality Date   AORTIC AND MITRAL VALVE REPLACEMENT     CATARACT EXTRACTION Left    with lens implant   CORONARY ARTERY BYPASS GRAFT     MITRAL VALVE REPLACEMENT     ORIF ARM     TEE WITHOUT CARDIOVERSION N/A 04/03/2022   Procedure: TRANSESOPHAGEAL ECHOCARDIOGRAM (TEE);  Surgeon: Meriam Sprague, MD;  Location: [facility] ENDOSCOPY;  Service: Cardiovascular;  Laterality: N/A;    Current Outpatient Medications  Medication Sig Dispense Refill   acetaminophen (TYLENOL) 500 MG tablet Take 500 mg by mouth See admin instructions. 2-  3 times a day     allopurinol (ZYLOPRIM) 100 MG tablet Take 400 mg by mouth daily.     amiodarone (PACERONE) 200 MG tablet Take 1 tablet (200 mg total) by mouth daily. 90 tablet 3   bumetanide (BUMEX) 2 MG tablet Take 1 tablet (2 mg total) by mouth daily with breakfast. (Patient taking differently: Take 2 mg by mouth 2 (two) times daily.) 30 tablet 0   calcitRIOL (ROCALTROL) 0.5 MCG capsule Take 0.5 mcg by mouth every other day.     calcium carbonate (TUMS - DOSED IN MG ELEMENTAL CALCIUM) 500  MG chewable tablet Chew 1-2 tablets by mouth as needed for indigestion or heartburn.     cetirizine (ZYRTEC) 10 MG tablet Take 10 mg by mouth at bedtime.     diclofenac Sodium (VOLTAREN) 1 % GEL Apply 2 g topically 4 (four) times daily as needed (to painful sites).     diphenhydrAMINE (BENADRYL) 25 MG tablet Take 25 mg by mouth at bedtime as needed (FOR BEE STING-RELATED ALLERGIC REACTIONS).     Glucosamine Sulfate 1000 MG TABS Take 1 tablet by mouth every evening.     methimazole (TAPAZOLE) 5 MG tablet Take 15 mg by mouth daily. TAKE 1 TABLET IN THE MORNING AND 2  TABLETAT NIGHT TIME     metoprolol succinate (TOPROL-XL) 25 MG 24 hr tablet Take by mouth daily. PT TAKES A HALF TABLET DAILY     mexiletine (MEXITIL) 200 MG capsule Take 1 capsule (200 mg total) by mouth in the morning, at noon, and at bedtime. 90 capsule 11   Multiple Vitamins-Minerals (CENTRUM SILVER 50+MEN) TABS Take 1 tablet by mouth daily with breakfast.     omeprazole (PRILOSEC) 20 MG capsule Take 1 capsule (20 mg total) by mouth daily. 30 capsule 11   potassium chloride SA (KLOR-CON M) 20 MEQ tablet Take 1 tablet (20 mEq total) by mouth daily. (Patient taking differently: Take 20 mEq by mouth 2 (two) times daily.) 30 tablet 0   ROLAIDS 550-110 MG CHEW Chew 1-2 tablets by mouth as needed (for heartburn).     simethicone (MYLICON) 125 MG chewable tablet Chew 125 mg by mouth every 6 (six) hours as needed for flatulence.     tamsulosin (FLOMAX) 0.4 MG CAPS capsule Take 0.8 mg by mouth at bedtime.     warfarin (COUMADIN) 3 MG tablet Take 1 tablet (3 mg total) by mouth daily at 4 PM. Follow up with repeat INR check 4/17. 30 tablet 0   No current facility-administered medications for this visit.    Allergies:   Bee venom, Ace inhibitors, Atorvastatin, Pravastatin, and Ranexa [ranolazine]   Social History:  The patient  reports that he has quit smoking. He has never used smokeless tobacco. He reports that he does not drink alcohol and  does not use drugs.   Family History:  The patient's family history includes Breast cancer in his daughter; Hypertension in his father and mother; Liver cancer (age of onset: 55) in his brother; Stroke in his mother; Uterine cancer in his daughter.  ROS:  Please see the history of present illness.    All other systems are reviewed and otherwise negative.   PHYSICAL EXAM:  VS:  There were no vitals taken for this visit. BMI: There is no height or weight on file to calculate BMI. Well nourished, well developed, in no acute distress HEENT: normocephalic, atraumatic Neck: no JVD, carotid bruits or masses Cardiac: ***  RRR; valves appreciated, no rubs,  or gallops Lungs:  *** CTA b/l, no wheezing, rhonchi or rales Abd: soft, nontender MS: no deformity or atrophy Ext: *** b/l ankle edema L>R (reported as chronic and unchanged) Skin: warm and dry, no rash Neuro:  No gross deficits appreciated Psych: euthymic mood, full affect   EKG:  not done today  11/29/22: TTE 1. Left ventricular ejection fraction, by estimation, is 30 to 35%. The  left ventricle has moderately decreased function. The left ventricle  demonstrates regional wall motion abnormalities (see scoring  diagram/findings for description). The left  ventricular internal cavity size was mildly dilated. There is mild  concentric left ventricular hypertrophy. Left ventricular diastolic  function could not be evaluated. There is akinesis of the left  ventricular, basal-mid inferior wall and inferolateral  wall.   2. Right ventricular systolic function is low normal. The right  ventricular size is normal. There is normal pulmonary artery systolic  pressure. The estimated right ventricular systolic pressure is 22.9 mmHg.   3. Left atrial size was severely dilated.   4. Right atrial size was severely dilated.   5. The mitral valve has been repaired/replaced. No evidence of mitral  valve regurgitation. Mild mitral stenosis. The mean  mitral valve gradient  is 6.8 mmHg with average heart rate of 85 bpm. There is a 29 mm St[employer] Jude  mechanical valve present in the  mitral position.   6. The aortic valve has been repaired/replaced. Aortic valve  regurgitation is trivial. There is a 21 mm St. Jude mechanical valve  present in the aortic position. Echo findings are consistent with normal  structure and function of the aortic valve  prosthesis. Aortic valve mean gradient measures 12.0 mmHg. Aortic valve  Vmax measures 2.46 m/s.   7. There is mild dilatation of the ascending aorta, measuring 43 mm.   8. The inferior vena cava is normal in size with greater than 50%  respiratory variability, suggesting right atrial pressure of 3 mmHg.   Comparison(s): A prior study was performed on 03/28/2022. Prior images  reviewed side by side. The left ventricular function is unchanged.  Retrospectively, LVEF may have been underestimated on the previous TTE.  Mitral gradients have increased slowly over  the last several years and there is now mild mitral stenosis.    04/03/22: TEE 1. No valvular vegetations visualized.   2. Left ventricular ejection fraction, by estimation, is 25 to 30%. The  left ventricle has severely decreased function. The left ventricular  internal cavity size was moderately dilated.   3. Right ventricular systolic function is mildly reduced. The right  ventricular size is normal.   4. The mitral valve has been repaired/replaced. There is a 29 mm St[employer] Jude  mechanical valve present in the mitral position. Procedure Date: 2005.  Echo findings are consistent with normal structure and function of the  mitral valve prosthesis. There are  normal washing jets with trivial mitral regurgitation. Mean gradient  at HR 88bpm, MVA 3.2cm2 by continuity.   5. The aortic valve has been repaired/replaced. Aortic valve  regurgitation is not visualized. There is a 21 mm St. Jude valve present  in the aortic position.  Procedure Date: 2005. Aortic valve mean gradient  measures 12.0 mmHg. Aortic valve Vmax measures   2.43 m/s. DI 0.35.   6. Left atrial size was severely dilated. No left atrial/left atrial  appendage thrombus was detected.   7. Right atrial size was mildly dilated.   8. Aortic dilatation noted. There is borderline  dilatation of the aortic  root, measuring 38 mm. There is Moderate (Grade III) plaque.   Recent Labs: No results found for requested labs within last 365 days.  No results found for requested labs within last 365 days.   CrCl cannot be calculated (Patient's most recent lab result is older than the maximum 21 days allowed.).   Wt Readings from Last 3 Encounters:  04/29/23 202 lb 3.2 oz (91.7 kg)  01/24/23 194 lb (88 kg)  11/05/22 188 lb (85.3 kg)     Other studies reviewed: Additional studies/records reviewed today include: summarized above  ASSESSMENT AND PLAN:  NICM Chronic CHF Appears compensated currently C/w Dr. Jacques Navy and team CKD limits GDMT volume/CM management w/ Dr. Oretha Milch  VT No symptoms to suggest recurrence *** On mexiletine/amiodarone not a candidate for ICD given hx of bacteremia Advanced age  Hyperthyroidism started on methimazole > PMD is managing In d/w Dr. Lalla Brothers, planned for ongoing amiodarone and thyroid management with PMD  ***   Paroxysmal AFib/flutter CHA2DS2Vasc is 4 *** Chronic amiodarone Warfarin   VHD Mechanical AVR/MVR *** Stable function by his last echos On warfarin C/w Dr. Jacques Navy   Secondary hypercoagulable state Warfarin is monitored and managed by his PMD   Disposition: ***  Current medicines are reviewed at length with the patient today.  The patient did not have any concerns regarding medicines.  Norma Fredrickson, PA-C 08/01/2023 10:18 AM     [facility] [address] [place] [address] [phone] (office)  [phone] (fax)

## 2023-08-04 ENCOUNTER — Encounter: Payer: Self-pay | Admitting: Physician Assistant

## 2023-08-04 ENCOUNTER — Ambulatory Visit: Payer: Medicare Other | Attending: Physician Assistant | Admitting: Physician Assistant

## 2023-08-04 VITALS — BP 142/80 | HR 93 | Ht 65.0 in | Wt 198.0 lb

## 2023-08-04 DIAGNOSIS — I480 Paroxysmal atrial fibrillation: Secondary | ICD-10-CM | POA: Insufficient documentation

## 2023-08-04 DIAGNOSIS — I5022 Chronic systolic (congestive) heart failure: Secondary | ICD-10-CM | POA: Diagnosis not present

## 2023-08-04 DIAGNOSIS — I428 Other cardiomyopathies: Secondary | ICD-10-CM | POA: Diagnosis not present

## 2023-08-04 DIAGNOSIS — D6869 Other thrombophilia: Secondary | ICD-10-CM | POA: Insufficient documentation

## 2023-08-04 DIAGNOSIS — I472 Ventricular tachycardia, unspecified: Secondary | ICD-10-CM | POA: Insufficient documentation

## 2023-08-04 MED ORDER — METOPROLOL SUCCINATE ER 25 MG PO TB24: 12.5000 mg | ORAL_TABLET | Freq: Every day | ORAL | 3 refills | Status: DC

## 2023-08-04 NOTE — Patient Instructions (Signed)
Medication Instructions:   Your physician recommends that you continue on your current medications as directed. Please refer to the Current Medication list given to you today.  *If you need a refill on your cardiac medications before your next appointment, please call your pharmacy*   Lab Work: NONE ORDERED  TODAY   If you have labs (blood work) drawn today and your tests are completely normal, you will receive your results only by: MyChart Message (if you have MyChart) OR A paper copy in the mail If you have any lab test that is abnormal or we need to change your treatment, we will call you to review the results.   Testing/Procedures:  NONE ORDERED  TODAY     Follow-Up: At [facility], you and your health needs are our priority.  As part of our continuing mission to provide you with exceptional heart care, we have created designated Provider Care Teams.  These Care Teams include your primary Cardiologist (physician) and Advanced Practice Providers (APPs -  Physician Assistants and Nurse Practitioners) who all work together to provide you with the care you need, when you need it.  We recommend signing up for the patient portal called "MyChart".  Sign up information is provided on this After Visit Summary.  MyChart is used to connect with patients for Virtual Visits (Telemedicine).  Patients are able to view lab/test results, encounter notes, upcoming appointments, etc.  Non-urgent messages can be sent to your provider as well.   To learn more about what you can do with MyChart, go to [email or web address].    Your next appointment:   6 month(s)  Provider:   You may see Lanier Prude, MD   Other Instructions

## 2023-08-07 DIAGNOSIS — H524 Presbyopia: Secondary | ICD-10-CM | POA: Diagnosis not present

## 2023-08-07 DIAGNOSIS — H52203 Unspecified astigmatism, bilateral: Secondary | ICD-10-CM | POA: Diagnosis not present

## 2023-08-28 DIAGNOSIS — I10 Essential (primary) hypertension: Secondary | ICD-10-CM | POA: Diagnosis not present

## 2023-08-28 DIAGNOSIS — M109 Gout, unspecified: Secondary | ICD-10-CM | POA: Diagnosis not present

## 2023-08-28 DIAGNOSIS — R7301 Impaired fasting glucose: Secondary | ICD-10-CM | POA: Diagnosis not present

## 2023-08-28 DIAGNOSIS — Z125 Encounter for screening for malignant neoplasm of prostate: Secondary | ICD-10-CM | POA: Diagnosis not present

## 2023-08-28 DIAGNOSIS — E0580 Other thyrotoxicosis without thyrotoxic crisis or storm: Secondary | ICD-10-CM | POA: Diagnosis not present

## 2023-08-28 DIAGNOSIS — E785 Hyperlipidemia, unspecified: Secondary | ICD-10-CM | POA: Diagnosis not present

## 2023-09-04 DIAGNOSIS — E0580 Other thyrotoxicosis without thyrotoxic crisis or storm: Secondary | ICD-10-CM | POA: Diagnosis not present

## 2023-09-04 DIAGNOSIS — N184 Chronic kidney disease, stage 4 (severe): Secondary | ICD-10-CM | POA: Diagnosis not present

## 2023-09-04 DIAGNOSIS — R972 Elevated prostate specific antigen [PSA]: Secondary | ICD-10-CM | POA: Diagnosis not present

## 2023-09-04 DIAGNOSIS — K5901 Slow transit constipation: Secondary | ICD-10-CM | POA: Diagnosis not present

## 2023-09-04 DIAGNOSIS — R82998 Other abnormal findings in urine: Secondary | ICD-10-CM | POA: Diagnosis not present

## 2023-09-04 DIAGNOSIS — Z0000 Encounter for general adult medical examination without abnormal findings: Secondary | ICD-10-CM | POA: Diagnosis not present

## 2023-09-04 DIAGNOSIS — I130 Hypertensive heart and chronic kidney disease with heart failure and stage 1 through stage 4 chronic kidney disease, or unspecified chronic kidney disease: Secondary | ICD-10-CM | POA: Diagnosis not present

## 2023-09-04 DIAGNOSIS — I428 Other cardiomyopathies: Secondary | ICD-10-CM | POA: Diagnosis not present

## 2023-09-04 DIAGNOSIS — Z1331 Encounter for screening for depression: Secondary | ICD-10-CM | POA: Diagnosis not present

## 2023-09-04 DIAGNOSIS — I10 Essential (primary) hypertension: Secondary | ICD-10-CM | POA: Diagnosis not present

## 2023-09-04 DIAGNOSIS — I480 Paroxysmal atrial fibrillation: Secondary | ICD-10-CM | POA: Diagnosis not present

## 2023-09-04 DIAGNOSIS — Z1339 Encounter for screening examination for other mental health and behavioral disorders: Secondary | ICD-10-CM | POA: Diagnosis not present

## 2023-09-04 DIAGNOSIS — Z7901 Long term (current) use of anticoagulants: Secondary | ICD-10-CM | POA: Diagnosis not present

## 2023-09-04 DIAGNOSIS — E785 Hyperlipidemia, unspecified: Secondary | ICD-10-CM | POA: Diagnosis not present

## 2023-09-04 DIAGNOSIS — I5022 Chronic systolic (congestive) heart failure: Secondary | ICD-10-CM | POA: Diagnosis not present

## 2023-09-11 DIAGNOSIS — R972 Elevated prostate specific antigen [PSA]: Secondary | ICD-10-CM | POA: Diagnosis not present

## 2023-09-11 DIAGNOSIS — R3912 Poor urinary stream: Secondary | ICD-10-CM | POA: Diagnosis not present

## 2023-09-11 DIAGNOSIS — N401 Enlarged prostate with lower urinary tract symptoms: Secondary | ICD-10-CM | POA: Diagnosis not present

## 2023-10-03 DIAGNOSIS — Z7901 Long term (current) use of anticoagulants: Secondary | ICD-10-CM | POA: Diagnosis not present

## 2023-10-03 DIAGNOSIS — M7021 Olecranon bursitis, right elbow: Secondary | ICD-10-CM | POA: Diagnosis not present

## 2023-10-03 DIAGNOSIS — Z23 Encounter for immunization: Secondary | ICD-10-CM | POA: Diagnosis not present

## 2023-10-03 DIAGNOSIS — I480 Paroxysmal atrial fibrillation: Secondary | ICD-10-CM | POA: Diagnosis not present

## 2023-10-03 DIAGNOSIS — Z952 Presence of prosthetic heart valve: Secondary | ICD-10-CM | POA: Diagnosis not present

## 2023-10-22 DIAGNOSIS — Z7901 Long term (current) use of anticoagulants: Secondary | ICD-10-CM | POA: Diagnosis not present

## 2023-10-22 DIAGNOSIS — I480 Paroxysmal atrial fibrillation: Secondary | ICD-10-CM | POA: Diagnosis not present

## 2023-10-22 DIAGNOSIS — Z952 Presence of prosthetic heart valve: Secondary | ICD-10-CM | POA: Diagnosis not present

## 2023-11-04 ENCOUNTER — Ambulatory Visit (HOSPITAL_COMMUNITY): Payer: Medicare Other | Attending: Internal Medicine

## 2023-11-04 DIAGNOSIS — I5020 Unspecified systolic (congestive) heart failure: Secondary | ICD-10-CM | POA: Insufficient documentation

## 2023-11-04 DIAGNOSIS — Z952 Presence of prosthetic heart valve: Secondary | ICD-10-CM | POA: Diagnosis not present

## 2023-11-04 LAB — ECHOCARDIOGRAM COMPLETE
AV Mean grad: 13 mm[Hg]
AV Peak grad: 23.5 mm[Hg]
Ao pk vel: 2.43 m/s
Area-P 1/2: 3.73 cm2
Calc EF: 26.4 %
S' Lateral: 4.7 cm
Single Plane A2C EF: 30.1 %
Single Plane A4C EF: 23.6 %

## 2023-11-06 ENCOUNTER — Ambulatory Visit: Payer: Medicare Other | Attending: Internal Medicine | Admitting: Internal Medicine

## 2023-11-06 VITALS — BP 116/62 | HR 91 | Ht 64.0 in | Wt 200.0 lb

## 2023-11-06 DIAGNOSIS — I10 Essential (primary) hypertension: Secondary | ICD-10-CM | POA: Diagnosis not present

## 2023-11-06 NOTE — Progress Notes (Signed)
Cardiology Office Note:    Date:  11/06/2023  ID:  Steve Riddle., DOB [birth year], MRN [id number]  PCP:  Garlan Fillers, MD  Cardiologist:  Parke Poisson, MD  Electrophysiologist:  Lanier Prude, MD   Referring MD: Garlan Fillers, MD   Chief Complaint/Reason for Referral: Mechanical AVR, MVR, HFrEF  History of Present Illness:    Steve Riddle. is a 89 y.o. male who I follow for heart failure with reduced ejection fraction felt to be nonischemic and mechanical AVR/MVR.  11/06/23: Discussed the use of AI scribe software for clinical note transcription with the patient, who gave verbal consent to proceed.  History of Present Illness   The patient, with a history of mechanical heart valve replacement, atrial fibrillation, and kidney disease, presents with complaints of dizziness, difficulty walking, and excessive salivation. The dizziness is reported to be persistent and is exacerbated by head movements. The patient also reports difficulty walking due to weakness and relies on a cane or walker for mobility. The patient's partner suggests that the dizziness may be a side effect of the patient's current medications, including mexiletine and amiodarone.  The patient also reports excessive salivation, which he believes may be a side effect of his medications. This symptom has reportedly affected the patient's speech and eating. The patient's spouse reports that a previous medication prescribed to manage this symptom resulted in breathing difficulties, leading to its discontinuation.  The patient also reports shortness of breath with strenuous exercise, which he attributes to his heart condition. Despite these symptoms, the patient reports enjoying time with his family and participating in family events. The patient's spouse reports that the patient's blood pressure, heart rate, and oxygen levels have been stable.       04/29/2023: Since her last visit he has been diagnosed with  hyperthyroidism and has been on methimazole.  Laboratory studies reviewed, TSH is 60, this is being actively managed by primary care and he has been instructed to remain on methimazole.  This is also been reviewed by electrophysiology as he continues on amiodarone at their visit in February, and was felt in shared decision making to be the better option to remain on amiodarone.  He does note some increased weakness especially over the last 3 weeks.  When diagnosed with hyperthyroidism he had lost weight, and with rectifying the hyperthyroidism he has gained the weight back and then some.  He does not have increased shortness of breath with activity and continues to mow his lawn with a ride mower and continues to cook for the family.  He denies any lower extremity edema.  His symptoms of weakness and fatigue do not sound like decompensated heart failure.  Reviewed echocardiogram which was overall stable.  Prior visits:  Doing well overall.  Continues to have dizziness which affects balance issues but this has been several years, he ambulates with a cane and is cautious around his house.  We reviewed precautions for reducing risk of falls including no loose rugs or cords.  Denies chest discomfort or limiting shortness of breath.  He continues to mow the lawn on his ride mower, and is able to go shopping at [employer] though this does tire him out at the end of the day.  He has 77 great-grandchildren, youngest is almost 42-year-old.  We discussed repeating an echocardiogram given reduced EF in the setting of infection, to review ejection fraction.  We will also review his mechanical valves.  The sound excellent on exam and  I suspect they will be well functioning.  History: I initially met patient in hospital during admission for bacteremia with need to evaluate for endocarditis. Patient presented to the ED on 03/27/22 complaining of nausea, vomiting, and weakness that started the night prior. Patient also had a  fever with a max temp 101. Patient was admitted to the hospitalist service for treatment of suspected sepsis, acute respiratory failure with hypoxia. Infectious disease consulted, found patient to have high grade strep gallolyticus bacteremia.   He has a history of atrial flutter with CTI ablation 03/2021 at [facility]. Initial ECG in hospital most consistent with atrial fibrillation with CVR. During hospitalization, patient appeared to convert to sinus rhythm. TEE was pursued for evaluation of endocarditis, and decision was made not to perform cardioversion given likely sinus rhythm. He remained in SR at his office visit with Dr. Jacqlyn Krauss after hospitalization, and appears to be in SR today. He continues on amiodarone 200 mg daily.   In 2012, patient developed atrial fibrillation after he had surgery to repair a right groin AV fistula. Required cardioversion in 04/2019 and 11/2019. Holter monitor from 03/2020 showed no VT, but patient presented to the hospital on 09/25/20 with symptomatic VT, thought to be related to his active COVID-19 infection. Stabilized on procainamide, amiodarone, and beta blocker. He has been continued on mexiletine. Repeat monitor 09/2020 that showed 2-1 atrial flutter, sinus bradycardia. Continued to have symptomatic atrial flutter, had a successful cardioversion in November 2021. However, patient was found to be back in atrial flutter in February 2022 and patient underwent catheter ablation in April 2022. Due to patient's bradycardia, considered a pacemaker but ultimately decided to avoid due to prior endocarditis and mechanical valves.   History includes AVR and MVR with mechanical 21 mm St. Jude aortic valve and a mechanical 29 mm St. Jude mitral valve placed in 06/07/2004 due to endocarditis and is chronically on warfarin. Patient developed e.faecalis bacteremia in June 2021. TEE on May 26 2020 showed no evidence of vegetations, abscess, valvular regurgitation per report. Patient was treated  empirically for mechanical valve endocarditis with a 6 week course of antibiotics. TEE during hospitalization 03/2022 showed no endocarditis.  He has a known history of CAD and his most recent LHC was completed on 07/20/2008. Showed 30% disease in proximal RCA, normal Lcx, 40% disease in mid LAD.   Patient has a known history of chronic HFrEF felt to be nonischemic, echocardiogram on 08/23/20 showed EF 35%, moderate LVH, mild RV systolic dysfunction. Given his reduced EF and his history of VT, ICD considered but deferred due to high risk of infection. More recently, echocardiogram in 01/10/2021 showed EF 45-50%, moderate LVH, mild RV systolic dysfunction performed at [facility]. Recent echo 03/28/22 showed significant drop in EF to 20-25%. We suspected this may be secondary to atrial fibrillation or acute illness and planned to recheck as outpatient. This was done with Dr. Jacqlyn Krauss and showed EF 35%, calculated EF 32%.   Finally, history also includes HLD, HTN, secondary hyperparathyroidism.   At last visit with Dr. Jacqlyn Krauss, bumex increased to home dose of 2 mg BID. Metoprolol succinate initiated, with intended dose of 12.5 mg, and increase to 25 mg given recent ED visit for tachypalpitations. Intended also for continued management of HFrEF. Follow up planned to eventually add losartan if tolerated. Will be difficult to titrate HF therapy in 89 yo.  Past Medical History:  Diagnosis Date   Anemia    Arthritis    Atrial fibrillation (HCC)  Diverticulosis    Endocarditis    GI bleed    Gout    Heart murmur    Hx of adenomatous colonic polyps    Hyperlipemia    Hyperparathyroidism (HCC)    Hypertension    Nephrosclerosis    Sepsis (HCC)     Past Surgical History:  Procedure Laterality Date   AORTIC AND MITRAL VALVE REPLACEMENT     CATARACT EXTRACTION Left    with lens implant   CORONARY ARTERY BYPASS GRAFT     MITRAL VALVE REPLACEMENT     ORIF ARM     TEE WITHOUT CARDIOVERSION N/A 04/03/2022    Procedure: TRANSESOPHAGEAL ECHOCARDIOGRAM (TEE);  Surgeon: Meriam Sprague, MD;  Location: [facility] ENDOSCOPY;  Service: Cardiovascular;  Laterality: N/A;    Current Medications: Current Meds  Medication Sig   acetaminophen (TYLENOL) 500 MG tablet Take 500 mg by mouth See admin instructions. 2- 3 times a day   allopurinol (ZYLOPRIM) 100 MG tablet Take 200 mg by mouth daily.   amiodarone (PACERONE) 200 MG tablet Take 1 tablet (200 mg total) by mouth daily.   bumetanide (BUMEX) 2 MG tablet Take 1 tablet (2 mg total) by mouth daily with breakfast. (Patient taking differently: Take 2 mg by mouth 2 (two) times daily.)   calcitRIOL (ROCALTROL) 0.5 MCG capsule Take 0.5 mcg by mouth every other day.   calcium carbonate (TUMS - DOSED IN MG ELEMENTAL CALCIUM) 500 MG chewable tablet Chew 1-2 tablets by mouth as needed for indigestion or heartburn.   cetirizine (ZYRTEC) 10 MG tablet Take 10 mg by mouth at bedtime.   diclofenac Sodium (VOLTAREN) 1 % GEL Apply 2 g topically 4 (four) times daily as needed (to painful sites).   diphenhydrAMINE (BENADRYL) 25 MG tablet Take 25 mg by mouth at bedtime as needed (FOR BEE STING-RELATED ALLERGIC REACTIONS).   Glucosamine Sulfate 1000 MG TABS Take 1 tablet by mouth every evening.   levothyroxine (SYNTHROID) 25 MCG tablet Take 25 mcg by mouth.   metoprolol succinate (TOPROL-XL) 25 MG 24 hr tablet Take 0.5 tablets (12.5 mg total) by mouth daily. PT TAKES A HALF TABLET DAILY   Multiple Vitamins-Minerals (CENTRUM SILVER 50+MEN) TABS Take 1 tablet by mouth daily with breakfast.   omeprazole (PRILOSEC) 20 MG capsule Take 1 capsule (20 mg total) by mouth daily.   potassium chloride SA (KLOR-CON M) 20 MEQ tablet Take 1 tablet (20 mEq total) by mouth daily. (Patient taking differently: Take 20 mEq by mouth 2 (two) times daily.)   ROLAIDS 550-110 MG CHEW Chew 1-2 tablets by mouth as needed (for heartburn).   simethicone (MYLICON) 125 MG chewable tablet Chew 125 mg by mouth  every 6 (six) hours as needed for flatulence.   tamsulosin (FLOMAX) 0.4 MG CAPS capsule Take 0.8 mg by mouth at bedtime.   warfarin (COUMADIN) 3 MG tablet Take 1 tablet (3 mg total) by mouth daily at 4 PM. Follow up with repeat INR check 4/17.   [DISCONTINUED] mexiletine (MEXITIL) 200 MG capsule Take 1 capsule (200 mg total) by mouth in the morning, at noon, and at bedtime.     Allergies:   Bee venom, Ace inhibitors, Atorvastatin, Pravastatin, and Ranexa [ranolazine]   Social History   Tobacco Use   Smoking status: Former   Smokeless tobacco: Never  Vaping Use   Vaping status: Never Used  Substance Use Topics   Alcohol use: No   Drug use: No     Family History: The patient's family history  includes Breast cancer in his daughter; Hypertension in his father and mother; Liver cancer (age of onset: 38) in his brother; Stroke in his mother; Uterine cancer in his daughter.  ROS:   Please see the history of present illness.    All other systems reviewed and are negative.  EKGs/Labs/Other Studies Reviewed:    The following studies were reviewed today:  EKG: EKG Interpretation Date/Time:  Thursday November 06 2023 13:15:52 EST Ventricular Rate:  89 PR Interval:    QRS Duration:  112 QT Interval:  376 QTC Calculation: 457 R Axis:   14  Text Interpretation: Sinus rhythm with First degree A-V block Inferior infarct , age undetermined Confirmed by Weston Brass ([id number]) on 11/06/2023 1:53:20 PM    Imaging studies that I have independently reviewed today: n/a  Recent Labs: No results found for requested labs within last 365 days.  Recent Lipid Panel No results found for: "CHOL", "TRIG", "HDL", "CHOLHDL", "VLDL", "LDLCALC", "LDLDIRECT"  Physical Exam:    VS:  BP 116/62 (BP Location: Left Arm, Patient Position: Sitting, Cuff Size: Normal)   Pulse 91   Ht 5\' 4"  (1.626 m)   Wt 200 lb (90.7 kg)   SpO2 97%   BMI 34.33 kg/m     Wt Readings from Last 5 Encounters:  11/06/23  200 lb (90.7 kg)  08/04/23 198 lb (89.8 kg)  04/29/23 202 lb 3.2 oz (91.7 kg)  01/24/23 194 lb (88 kg)  11/05/22 188 lb (85.3 kg)    Constitutional: No acute distress Eyes: sclera non-icteric, normal conjunctiva and lids ENMT: normal dentition, moist mucous membranes Cardiovascular: regular rhythm, normal rate, no murmurs.  Crisp mechanical S1 and S2, unchanged from last exam. No jugular venous distention.  Respiratory: clear to auscultation bilaterally GI : normal bowel sounds, soft and nontender. No distention.   MSK: extremities warm, well perfused. No edema.  NEURO: grossly nonfocal exam, moves all extremities. PSYCH: alert and oriented x 3, normal mood and affect.   ASSESSMENT:    1. Primary hypertension      PLAN:    Paroxysmal atrial fibrillation (HCC) - Plan: EKG 12-Lead Secondary hypercoagulable state (HCC) Long term current use of antiarrhythmic drug Paroxysmal ventricular tachycardia (HCC)  - following with EP for AAD: mexiletine, amiodarone.  Amiodarone reviewed in light of his hyperthyroidism and felt to be appropriate to continue by EP. - continues on amiodarone for atrial fibrillation and VT, continue.  - continue warfarin anticoagulation, INR followed by PCP, most recent INR 2.9. - Metoprolol succinate 25 mg, he did have some palpitations when off of metoprolol and we have decided in shared decision making to continue this medication despite some orthostasis.  I have instructed the patient to obtain compression socks which I think will help.  HFrEF - EF approximately 30 to 35% - previously described as nonischemic CM, last coronary assessment appears to be 2009.  - in the absence of chest pain or DOE, would recommend medical management, - not much room to add additional therapy due to BP and renal dysfunction.  Acute bacterial endocarditis -resolved, no recurrence of symptoms.  Hyperlipidemia, unspecified hyperlipidemia type -lipids have improved with an LDL  of 80 in June 2023, triglycerides remain elevated.  Recommend diet and lifestyle modification and can consider addition of statin if patient interested for nonobstructive CAD and secondary prevention.   Primary hypertension - BP normal, would not add additional therapy for BP at this time. On metoprolol succinate as needed.   Coronary artery disease involving  native coronary artery of native heart without angina pectoris - mild and nonobstructive.  S/P MVR (mitral valve replacement) S/P AVR -warfarin anticoagulation, monitored by primary doctor. Review of EMR suggests prior GI bleed.  His INR was recently 2.1 and he was encouraged to adjust dose of warfarin, being managed by PCP. - stable function on TEE and TTE  Lower Extremity Edema Patient has been off Bumex for 5 days and has developed 2+ lower extremity edema. -Resume Bumex as soon as possible, was out of script from nephrologist. He just received. -Monitor salt intake.   Total time of encounter: 40 minutes total time of encounter, including 31 minutes spent in face-to-face patient care on the date of this encounter. This time includes coordination of care and counseling regarding above mentioned problem list. Remainder of non-face-to-face time involved reviewing chart documents/testing relevant to the patient encounter and documentation in the medical record. I have independently reviewed documentation from referring provider.   Weston Brass, MD, [facility]  [facility]    Medication Adjustments/Labs and Tests Ordered: Current medicines are reviewed at length with the patient today.  Concerns regarding medicines are outlined above.   Orders Placed This Encounter  Procedures   EKG 12-Lead    No orders of the defined types were placed in this encounter.   Patient Instructions  Medication Instructions:  Your physician recommends that you continue on your current medications as directed. Please refer to the Current  Medication list given to you today.  *If you need a refill on your cardiac medications before your next appointment, please call your pharmacy*  Lab Work: None  Testing/Procedures: None  Follow-Up: At [facility], you and your health needs are our priority.  As part of our continuing mission to provide you with exceptional heart care, we have created designated Provider Care Teams.  These Care Teams include your primary Cardiologist (physician) and Advanced Practice Providers (APPs -  Physician Assistants and Nurse Practitioners) who all work together to provide you with the care you need, when you need it.  We recommend signing up for the patient portal called "MyChart".  Sign up information is provided on this After Visit Summary.  MyChart is used to connect with patients for Virtual Visits (Telemedicine).  Patients are able to view lab/test results, encounter notes, upcoming appointments, etc.  Non-urgent messages can be sent to your provider as well.   To learn more about what you can do with MyChart, go to [email or web address].    Your next appointment:   6 month(s)  Provider:   Parke Poisson, MD

## 2023-11-06 NOTE — Patient Instructions (Signed)
Medication Instructions:  Your physician recommends that you continue on your current medications as directed. Please refer to the Current Medication list given to you today.  *If you need a refill on your cardiac medications before your next appointment, please call your pharmacy*  Lab Work: None  Testing/Procedures: None  Follow-Up: At [facility], you and your health needs are our priority.  As part of our continuing mission to provide you with exceptional heart care, we have created designated Provider Care Teams.  These Care Teams include your primary Cardiologist (physician) and Advanced Practice Providers (APPs -  Physician Assistants and Nurse Practitioners) who all work together to provide you with the care you need, when you need it.  We recommend signing up for the patient portal called "MyChart".  Sign up information is provided on this After Visit Summary.  MyChart is used to connect with patients for Virtual Visits (Telemedicine).  Patients are able to view lab/test results, encounter notes, upcoming appointments, etc.  Non-urgent messages can be sent to your provider as well.   To learn more about what you can do with MyChart, go to [email or web address].    Your next appointment:   6 month(s)  Provider:   Parke Poisson, MD

## 2023-11-07 ENCOUNTER — Telehealth: Payer: Self-pay | Admitting: *Deleted

## 2023-11-07 NOTE — Telephone Encounter (Signed)
I spoke to him and states that he believes his kidney doctor prescribes it.  He has been on it for years.  He will reach out to them and ask about decreasing the dose (is on 400 mg/max amt he states).

## 2023-11-07 NOTE — Telephone Encounter (Signed)
-----   Message from Parke Poisson sent at 11/07/2023 10:21 AM EST ----- Thank you! Michelle Nasuti, could you pass the message along to the patient to discuss his allopurinol with his primary doctor in case that may be increasing salivation? Thanks! GA ----- Message ----- From: Rosalee Kaufman, RPH-CPP Sent: 11/07/2023  10:11 AM EST To: Parke Poisson, MD  In looking over his meds, he has several that might actually decrease salivation, but there are 2 possibilities for increased.  allopurinol lists that it can cause swelling of the salivary glands and amiodarone can cause "altered salivation"    Don't know that I've ever seen salivary problems with amiodarone.   He might talk to PCP about the allopurinol, I don't see it go over 300 mg very often.  Belenda Cruise ----- Message ----- From: Parke Poisson, MD Sent: 11/06/2023   1:51 PM EST To: [facility]  Can someone review his med list for medicines that may cause excessive salivation? Thank you! GA

## 2023-11-10 ENCOUNTER — Telehealth: Payer: Self-pay | Admitting: Internal Medicine

## 2023-11-10 NOTE — Telephone Encounter (Signed)
Patient calling to speak to the nurse. Please advise  

## 2023-11-10 NOTE — Telephone Encounter (Signed)
Pt was here last week to see Dr Jacques Navy, she asked him to follow up on a medication that was needing to be changed. He spoke with his kidney Doctor and his Kidney Doctor decreased Allopurinol to 200mg  a day. He wanted this information relayed to Dr Jacques Navy. Informed him that I would send this information to his provider.

## 2023-11-11 ENCOUNTER — Other Ambulatory Visit: Payer: Self-pay | Admitting: Cardiology

## 2023-12-02 DIAGNOSIS — N184 Chronic kidney disease, stage 4 (severe): Secondary | ICD-10-CM | POA: Diagnosis not present

## 2023-12-02 DIAGNOSIS — E211 Secondary hyperparathyroidism, not elsewhere classified: Secondary | ICD-10-CM | POA: Diagnosis not present

## 2023-12-02 DIAGNOSIS — E785 Hyperlipidemia, unspecified: Secondary | ICD-10-CM | POA: Diagnosis not present

## 2023-12-02 DIAGNOSIS — I10 Essential (primary) hypertension: Secondary | ICD-10-CM | POA: Diagnosis not present

## 2023-12-02 DIAGNOSIS — Z7901 Long term (current) use of anticoagulants: Secondary | ICD-10-CM | POA: Diagnosis not present

## 2023-12-02 DIAGNOSIS — I480 Paroxysmal atrial fibrillation: Secondary | ICD-10-CM | POA: Diagnosis not present

## 2023-12-02 DIAGNOSIS — Z952 Presence of prosthetic heart valve: Secondary | ICD-10-CM | POA: Diagnosis not present

## 2023-12-02 DIAGNOSIS — M109 Gout, unspecified: Secondary | ICD-10-CM | POA: Diagnosis not present

## 2024-01-06 DIAGNOSIS — Z952 Presence of prosthetic heart valve: Secondary | ICD-10-CM | POA: Diagnosis not present

## 2024-01-06 DIAGNOSIS — E211 Secondary hyperparathyroidism, not elsewhere classified: Secondary | ICD-10-CM | POA: Diagnosis not present

## 2024-01-06 DIAGNOSIS — I480 Paroxysmal atrial fibrillation: Secondary | ICD-10-CM | POA: Diagnosis not present

## 2024-01-06 DIAGNOSIS — Z7901 Long term (current) use of anticoagulants: Secondary | ICD-10-CM | POA: Diagnosis not present

## 2024-02-09 ENCOUNTER — Other Ambulatory Visit: Payer: Self-pay | Admitting: *Deleted

## 2024-02-09 ENCOUNTER — Telehealth: Payer: Self-pay | Admitting: Internal Medicine

## 2024-02-09 ENCOUNTER — Other Ambulatory Visit (HOSPITAL_COMMUNITY): Payer: Self-pay

## 2024-02-09 MED ORDER — AMIODARONE HCL 200 MG PO TABS
200.0000 mg | ORAL_TABLET | Freq: Every day | ORAL | 0 refills | Status: DC
  Filled 2024-02-09: qty 90, 90d supply, fill #0

## 2024-02-09 NOTE — Telephone Encounter (Signed)
*  STAT* If patient is at the pharmacy, call can be transferred to refill team.   1. Which medications need to be refilled? (please list name of each medication and dose if known)  amiodarone (PACERONE) 200 MG tablet    2. Would you like to learn more about the convenience, safety, & potential cost savings by using the [facility]? No     3. Are you open to using the [facility] (Type [facility]. Not at this time   4. Which pharmacy/location (including street and city if local pharmacy) is medication to be sent to?  [id number] - ,  - [address]     5. Do they need a 30 day or 90 day supply? 90 day

## 2024-02-11 ENCOUNTER — Other Ambulatory Visit (HOSPITAL_COMMUNITY): Payer: Self-pay

## 2024-02-11 MED ORDER — AMIODARONE HCL 200 MG PO TABS
200.0000 mg | ORAL_TABLET | Freq: Every day | ORAL | 1 refills | Status: DC
Start: 2024-02-11 — End: 2024-08-09

## 2024-02-11 NOTE — Telephone Encounter (Signed)
Please send this to [facility], the medications is listed under Dr. Lalla Brothers . [id number] - [place],  - [address]     amiodarone (PACERONE) 200 MG tablet

## 2024-02-11 NOTE — Telephone Encounter (Signed)
 Pt's medication was sent to pt's pharmacy as requested. Confirmation received.

## 2024-02-17 DIAGNOSIS — I480 Paroxysmal atrial fibrillation: Secondary | ICD-10-CM | POA: Diagnosis not present

## 2024-02-17 DIAGNOSIS — Z952 Presence of prosthetic heart valve: Secondary | ICD-10-CM | POA: Diagnosis not present

## 2024-02-17 DIAGNOSIS — Z7901 Long term (current) use of anticoagulants: Secondary | ICD-10-CM | POA: Diagnosis not present

## 2024-02-25 DIAGNOSIS — I480 Paroxysmal atrial fibrillation: Secondary | ICD-10-CM | POA: Diagnosis not present

## 2024-02-25 DIAGNOSIS — E0580 Other thyrotoxicosis without thyrotoxic crisis or storm: Secondary | ICD-10-CM | POA: Diagnosis not present

## 2024-02-25 DIAGNOSIS — E211 Secondary hyperparathyroidism, not elsewhere classified: Secondary | ICD-10-CM | POA: Diagnosis not present

## 2024-02-25 DIAGNOSIS — Z952 Presence of prosthetic heart valve: Secondary | ICD-10-CM | POA: Diagnosis not present

## 2024-02-25 DIAGNOSIS — Z7901 Long term (current) use of anticoagulants: Secondary | ICD-10-CM | POA: Diagnosis not present

## 2024-03-03 DIAGNOSIS — R972 Elevated prostate specific antigen [PSA]: Secondary | ICD-10-CM | POA: Diagnosis not present

## 2024-03-10 DIAGNOSIS — Z952 Presence of prosthetic heart valve: Secondary | ICD-10-CM | POA: Diagnosis not present

## 2024-03-10 DIAGNOSIS — I480 Paroxysmal atrial fibrillation: Secondary | ICD-10-CM | POA: Diagnosis not present

## 2024-03-10 DIAGNOSIS — E211 Secondary hyperparathyroidism, not elsewhere classified: Secondary | ICD-10-CM | POA: Diagnosis not present

## 2024-03-10 DIAGNOSIS — Z7901 Long term (current) use of anticoagulants: Secondary | ICD-10-CM | POA: Diagnosis not present

## 2024-03-24 DIAGNOSIS — Z952 Presence of prosthetic heart valve: Secondary | ICD-10-CM | POA: Diagnosis not present

## 2024-03-24 DIAGNOSIS — E0580 Other thyrotoxicosis without thyrotoxic crisis or storm: Secondary | ICD-10-CM | POA: Diagnosis not present

## 2024-03-24 DIAGNOSIS — I4891 Unspecified atrial fibrillation: Secondary | ICD-10-CM | POA: Diagnosis not present

## 2024-03-24 DIAGNOSIS — Z7901 Long term (current) use of anticoagulants: Secondary | ICD-10-CM | POA: Diagnosis not present

## 2024-05-05 DIAGNOSIS — Z952 Presence of prosthetic heart valve: Secondary | ICD-10-CM | POA: Diagnosis not present

## 2024-05-05 DIAGNOSIS — I480 Paroxysmal atrial fibrillation: Secondary | ICD-10-CM | POA: Diagnosis not present

## 2024-05-05 DIAGNOSIS — Z7901 Long term (current) use of anticoagulants: Secondary | ICD-10-CM | POA: Diagnosis not present

## 2024-05-09 ENCOUNTER — Other Ambulatory Visit: Payer: Self-pay | Admitting: Cardiology

## 2024-06-16 DIAGNOSIS — E039 Hypothyroidism, unspecified: Secondary | ICD-10-CM | POA: Diagnosis not present

## 2024-06-16 DIAGNOSIS — I5022 Chronic systolic (congestive) heart failure: Secondary | ICD-10-CM | POA: Diagnosis not present

## 2024-06-16 DIAGNOSIS — Z952 Presence of prosthetic heart valve: Secondary | ICD-10-CM | POA: Diagnosis not present

## 2024-06-16 DIAGNOSIS — I480 Paroxysmal atrial fibrillation: Secondary | ICD-10-CM | POA: Diagnosis not present

## 2024-06-16 DIAGNOSIS — Z1389 Encounter for screening for other disorder: Secondary | ICD-10-CM | POA: Diagnosis not present

## 2024-06-16 DIAGNOSIS — Z7901 Long term (current) use of anticoagulants: Secondary | ICD-10-CM | POA: Diagnosis not present

## 2024-06-16 DIAGNOSIS — E211 Secondary hyperparathyroidism, not elsewhere classified: Secondary | ICD-10-CM | POA: Diagnosis not present

## 2024-07-28 DIAGNOSIS — I480 Paroxysmal atrial fibrillation: Secondary | ICD-10-CM | POA: Diagnosis not present

## 2024-07-28 DIAGNOSIS — Z952 Presence of prosthetic heart valve: Secondary | ICD-10-CM | POA: Diagnosis not present

## 2024-07-28 DIAGNOSIS — E211 Secondary hyperparathyroidism, not elsewhere classified: Secondary | ICD-10-CM | POA: Diagnosis not present

## 2024-07-28 DIAGNOSIS — Z7901 Long term (current) use of anticoagulants: Secondary | ICD-10-CM | POA: Diagnosis not present

## 2024-07-29 ENCOUNTER — Telehealth: Payer: Self-pay | Admitting: Internal Medicine

## 2024-07-29 MED ORDER — METOPROLOL SUCCINATE ER 25 MG PO TB24: 12.5000 mg | ORAL_TABLET | Freq: Every day | ORAL | 1 refills | Status: DC

## 2024-07-29 NOTE — Telephone Encounter (Signed)
 Spoke with pt. Metoprolol  was sent to pts pharmacy as directed.

## 2024-07-29 NOTE — Telephone Encounter (Signed)
*  STAT* If patient is at the pharmacy, call can be transferred to refill team.   1. Which medications need to be refilled? (please list name of each medication and dose if known)   metoprolol  succinate (TOPROL -XL) 25 MG 24 hr tablet    2. Which pharmacy/location (including street and city if local pharmacy) is medication to be sent to?  [id number] - [place], [place] - [address]      3. Do they need a 30 day or 90 day supply? 90 day    Pt is out of medication

## 2024-08-03 NOTE — Progress Notes (Signed)
 Cardiology Office Note:  .   Date:  08/09/2024  ID:  Steve Riddle., DOB 05-16-1929, MRN [id number] PCP: Yolande Toribio MATSU, MD  [facility] Providers Cardiologist:  Soyla DELENA Merck, MD Electrophysiologist:  OLE ONEIDA HOLTS, MD    History of Present Illness: .   Steve Riddle. is a 89 y.o. male.  Discussed the use of AI scribe software for clinical note transcription with the patient, who gave verbal consent to proceed.  History of Present Illness Steve Riddle. is a 89 year old male with mechanical aortic and mitral valve replacements, heart failure with reduced ejection fraction, and atrial fibrillation who presents with palpitations and dizziness.  He experiences palpitations with a rapid and irregular heartbeat, with heart rate fluctuations between 38 and 180 beats per minute, now stabilized between 96 and 103 beats per minute. Dizziness and weakness occur during these episodes, notably after mowing the lawn, requiring extended recovery time.  He has mechanical aortic and mitral valve replacements due to endocarditis, with a 21 mm Saint Jude aortic valve and a 29 mm Saint Jude mitral valve placed in 2005. He is on chronic anticoagulation with warfarin, maintaining stable INR levels with monthly monitoring with PCP.  He has heart failure with reduced ejection fraction, with the most recent ejection fraction noted to be 30-35%. He has a history of atrial fibrillation and ventricular tachycardia, managed with mexiletine and amiodarone . He resumed metoprolol  succinate 12.5 mg daily after experiencing palpitations when it was previously stopped. He takes Bumex  4 mg daily, divided into two doses, for fluid management.    ROS: negative except per HPI above.  Studies Reviewed: SABRA   EKG Interpretation Date/Time:  Monday August 09 2024 09:55:50 EDT Ventricular Rate:  101 PR Interval:    QRS Duration:  114 QT Interval:  362 QTC Calculation: 469 R Axis:   74  Text  Interpretation: Sinus tachycardia with occasional Premature ventricular complexes Nonspecific ST and T wave abnormality When compared with ECG of 06-Nov-2023 13:15, Sinus rhythm has replaced Junctional rhythm Criteria for Inferior infarct are no longer Present Confirmed by Winter Jocelyn ([id number]) on 08/09/2024 10:03:33 AM    Results LABS LDL: 80 (05/2022) TSH: 3.5  DIAGNOSTIC EKG: One premature ventricular contraction (PVC), otherwise normal rhythm (08/03/2024) Risk Assessment/Calculations:    CHA2DS2-VASc Score = 4   This indicates a 4.8% annual risk of stroke. The patient's score is based upon: CHF History: 1 HTN History: 1 Diabetes History: 0 Stroke History: 0 Vascular Disease History: 0 Age Score: 2 Gender Score: 0      Physical Exam:   VS:  BP (!) 100/54 (BP Location: Right Arm, Patient Position: Sitting, Cuff Size: Normal)   Pulse 100   Ht 5' 4 (1.626 m)   Wt 172 lb (78 kg)   SpO2 96%   BMI 29.52 kg/m    Wt Readings from Last 3 Encounters:  08/09/24 172 lb (78 kg)  11/06/23 200 lb (90.7 kg)  08/04/23 198 lb (89.8 kg)     Physical Exam MEASUREMENTS: Weight- 172. GENERAL: Alert, cooperative, well developed, no acute distress, good skin color. HEENT: Normocephalic, normal oropharynx, moist mucous membranes. CHEST: Clear to auscultation bilaterally, no wheezes, rhonchi, or crackles. CARDIOVASCULAR: Normal heart rate and rhythm, S1 and S2 normal without murmurs, mechanical valves sound crisp. ABDOMEN: Soft, non-tender, non-distended, without organomegaly, normal bowel sounds. EXTREMITIES: No cyanosis or edema. NEUROLOGICAL: Cranial nerves grossly intact, moves all extremities without gross motor or sensory deficit.   ASSESSMENT  AND PLAN: .    Assessment and Plan Assessment & Plan Heart failure with reduced ejection fraction Chronic heart failure with EF 30-35%. Increased dyspnea and dizziness. Heart rate fluctuations suggest possible arrhythmia. Discussed  syncope and life-threatening event risks. - Order echocardiogram to assess heart function and valve status given fatigue and DOE. - Order heart monitor to evaluate heart rate fluctuations and arrhythmias. - Continue metoprolol  succinate 12.5 mg daily. - Advise against mowing lawn on riding mower until further evaluation due to syncope risk.  Mechanical aortic and mitral valve replacement Valves functioning well with no evidence of dysfunction by clinical exam. - Monitor valve function with echocardiogram.  Paroxysmal atrial fibrillation and ventricular tachycardia On amiodarone  and mexiletine. Reports palpitations. Recent EKG showed one PVC. Discussed potential pacemaker need if life-threatening arrhythmias detected. - Continue amiodarone  and mexiletine. - Order heart monitor to assess for arrhythmias. - Consider electrophysiology follow-up after testing.  Hyperthyroidism on treatment TSH level 3.5 indicates well-managed thyroid  function. - On levothyroxine. - Follow up with primary care for thyroid  management and lab review.      Soyla Merck, MD, FACC

## 2024-08-07 ENCOUNTER — Other Ambulatory Visit: Payer: Self-pay | Admitting: Cardiology

## 2024-08-09 ENCOUNTER — Encounter: Payer: Self-pay | Admitting: *Deleted

## 2024-08-09 ENCOUNTER — Encounter: Payer: Self-pay | Admitting: Internal Medicine

## 2024-08-09 ENCOUNTER — Ambulatory Visit: Attending: Internal Medicine | Admitting: Internal Medicine

## 2024-08-09 VITALS — BP 100/54 | HR 100 | Ht 64.0 in | Wt 172.0 lb

## 2024-08-09 DIAGNOSIS — R002 Palpitations: Secondary | ICD-10-CM | POA: Insufficient documentation

## 2024-08-09 DIAGNOSIS — E0590 Thyrotoxicosis, unspecified without thyrotoxic crisis or storm: Secondary | ICD-10-CM | POA: Insufficient documentation

## 2024-08-09 DIAGNOSIS — I472 Ventricular tachycardia, unspecified: Secondary | ICD-10-CM | POA: Diagnosis not present

## 2024-08-09 DIAGNOSIS — I5020 Unspecified systolic (congestive) heart failure: Secondary | ICD-10-CM | POA: Insufficient documentation

## 2024-08-09 DIAGNOSIS — I5022 Chronic systolic (congestive) heart failure: Secondary | ICD-10-CM | POA: Insufficient documentation

## 2024-08-09 DIAGNOSIS — I428 Other cardiomyopathies: Secondary | ICD-10-CM | POA: Diagnosis not present

## 2024-08-09 DIAGNOSIS — Z952 Presence of prosthetic heart valve: Secondary | ICD-10-CM | POA: Insufficient documentation

## 2024-08-09 DIAGNOSIS — Z79899 Other long term (current) drug therapy: Secondary | ICD-10-CM | POA: Insufficient documentation

## 2024-08-09 DIAGNOSIS — I480 Paroxysmal atrial fibrillation: Secondary | ICD-10-CM | POA: Diagnosis not present

## 2024-08-09 DIAGNOSIS — R001 Bradycardia, unspecified: Secondary | ICD-10-CM | POA: Diagnosis not present

## 2024-08-09 DIAGNOSIS — D6869 Other thrombophilia: Secondary | ICD-10-CM | POA: Diagnosis not present

## 2024-08-09 MED ORDER — BUMETANIDE 2 MG PO TABS: 2.0000 mg | ORAL_TABLET | Freq: Two times a day (BID) | ORAL | Status: AC

## 2024-08-09 NOTE — Progress Notes (Signed)
Patient enrolled for [employer] / [employer] to ship a 30 day cardiac event monitor to his address on file.

## 2024-08-09 NOTE — Patient Instructions (Addendum)
 Medication Instructions:  No changes; okay to take Bumex  2 mg two times daily *If you need a refill on your cardiac medications before your next appointment, please call your pharmacy*  Lab Work: None If you have labs (blood work) drawn today and your tests are completely normal, you will receive your results only by: MyChart Message (if you have MyChart) OR A paper copy in the mail If you have any lab test that is abnormal or we need to change your treatment, we will call you to review the results.  Testing/Procedures: Your physician has requested that you have an echocardiogram. Echocardiography is a painless test that uses sound waves to create images of your heart. It provides your doctor with information about the size and shape of your heart and how well your heart's chambers and valves are working. This procedure takes approximately one hour. There are no restrictions for this procedure. Please do NOT wear cologne, perfume, aftershave, or lotions (deodorant is allowed). Please arrive 15 minutes prior to your appointment time.  Please note: We ask at that you not bring children with you during ultrasound (echo/ vascular) testing. Due to room size and safety concerns, children are not allowed in the ultrasound rooms during exams. Our front office staff cannot provide observation of children in our lobby area while testing is being conducted. An adult accompanying a patient to their appointment will only be allowed in the ultrasound room at the discretion of the ultrasound technician under special circumstances. We apologize for any inconvenience.   [employer] Cardiac Event Monitor Instructions  Your physician has requested you wear your cardiac event monitor for 30 days, (1-30). [employer] may call or text to confirm a shipping address. The monitor will be sent to a land address via UPS. [employer] will not ship a monitor to a PO BOX. It typically takes 3-5 days to receive your monitor  after it has been enrolled. [employer] will assist with USPS tracking if your package is delayed. The telephone number for [employer] is [phone]. Once you have received your monitor, please review the enclosed instructions. Instruction tutorials can also be viewed under help and settings on the enclosed cell phone. Your monitor has already been registered assigning a specific monitor serial # to you.  Billing and Self Pay Discount Information  [employer] has been provided the insurance information we had on file for you.  If your insurance has been updated, please call [employer] at [phone] to provide them with your updated insurance information.   [employer] offers a discounted Self Pay option for patients who have insurance that does not cover their cardiac event monitor or patients without insurance.  The discounted cost of a Self Pay Cardiac Event Monitor would be $225.00 , if the patient contacts [employer] at [phone] within 7 days of applying the monitor to make payment arrangements.  If the patient does not contact [employer] within 7 days of applying the monitor, the cost of the cardiac event monitor will be $350.00.  Applying the monitor  Remove cell phone from case and turn it on. The cell phone works as IT consultant and needs to be within [employer] of you at all times. The cell phone will need to be charged on a daily basis. We recommend you plug the cell phone into the enclosed charger at your bedside table every night.  Monitor batteries: You will receive two monitor batteries labelled #1 and #2. These are your recorders. Plug battery #2 onto the second connection on the  Social research officer, government. Keep one battery on the charger at all times. This will keep the monitor battery deactivated. It will also keep it fully charged for when you need to switch your monitor batteries. A small light will be blinking on the battery emblem when it is charging. The light on the battery  emblem will remain on when the battery is fully charged.  Open package of a Monitor strip. Insert battery #1 into black hood on strip and gently squeeze monitor battery onto connection as indicated in instruction booklet. Set aside while preparing skin.  Choose location for your strip, vertical or horizontal, as indicated in the instruction booklet. Shave to remove all hair from location. There cannot be any lotions, oils, powders, or colognes on skin where monitor is to be applied. Wipe skin clean with enclosed Saline wipe. Dry skin completely.  Peel paper labeled #1 off the back of the Monitor strip exposing the adhesive. Place the monitor on the chest in the vertical or horizontal position shown in the instruction booklet. One arrow on the monitor strip must be pointing upward. Carefully remove paper labeled #2, attaching remainder of strip to your skin. Try not to create any folds or wrinkles in the strip as you apply it.  Firmly press and release the circle in the center of the monitor battery. You will hear a small beep. This is turning the monitor battery on. The heart emblem on the monitor battery will light up every 5 seconds if the monitor battery in turned on and connected to the patient securely. Do not push and hold the circle down as this turns the monitor battery off. The cell phone will locate the monitor battery. A screen will appear on the cell phone checking the connection of your monitor strip. This may read poor connection initially but change to good connection within the next minute. Once your monitor accepts the connection you will hear a series of 3 beeps followed by a climbing crescendo of beeps. A screen will appear on the cell phone showing the two monitor strip placement options. Touch the picture that demonstrates where you applied the monitor strip.  Your monitor strip and battery are waterproof. You are able to shower, bathe, or swim with the monitor on. They  just ask you do not submerge deeper than 3 feet underwater. We recommend removing the monitor if you are swimming in a lake, river, or ocean.  Your monitor battery will need to be switched to a fully charged monitor battery approximately once a week. The cell phone will alert you of an action which needs to be made.  On the cell phone, tap for details to reveal connection status, monitor battery status, and cell phone battery status. The green dots indicates your monitor is in good status. A red dot indicates there is something that needs your attention.  To record a symptom, click the circle on the monitor battery. In 30-60 seconds a list of symptoms will appear on the cell phone. Select your symptom and tap save. Your monitor will record a sustained or significant arrhythmia regardless of you clicking the button. Some patients do not feel the heart rhythm irregularities. [employer] will notify us  of any serious or critical events.  Refer to instruction booklet for instructions on switching batteries, changing strips, the Do not disturb or Pause features, or any additional questions.  Call [employer] at [phone], to confirm your monitor is transmitting and record your baseline. They will answer any questions you may have  regarding the monitor instructions at that time.  Returning the monitor to [employer]  Place all equipment back into blue box. Peel off strip of paper to expose adhesive and close box securely. There is a prepaid UPS shipping label on this box. Drop in a UPS drop box, or at a UPS facility like [employer]. You may also contact [employer] to arrange UPS to pick up monitor package at your home.   Follow-Up: At [facility], you and your health needs are our priority.  As part of our continuing mission to provide you with exceptional heart care, our providers are all part of one team.  This team includes your primary Cardiologist (physician) and Advanced Practice  Providers or APPs (Physician Assistants and Nurse Practitioners) who all work together to provide you with the care you need, when you need it.  Your next appointment:    09/28/2024 at 10:20 am  Provider:   Soyla DELENA Merck, MD    We recommend signing up for the patient portal called MyChart.  Sign up information is provided on this After Visit Summary.  MyChart is used to connect with patients for Virtual Visits (Telemedicine).  Patients are able to view lab/test results, encounter notes, upcoming appointments, etc.  Non-urgent messages can be sent to your provider as well.   To learn more about what you can do with MyChart, go to [email or web address].   Other Instructions Please call us  with any Cardiology related questions/concerns.  [phone].  Thank you!

## 2024-08-10 DIAGNOSIS — H3589 Other specified retinal disorders: Secondary | ICD-10-CM | POA: Diagnosis not present

## 2024-08-10 DIAGNOSIS — H26492 Other secondary cataract, left eye: Secondary | ICD-10-CM | POA: Diagnosis not present

## 2024-08-10 DIAGNOSIS — H43812 Vitreous degeneration, left eye: Secondary | ICD-10-CM | POA: Diagnosis not present

## 2024-08-10 DIAGNOSIS — H52203 Unspecified astigmatism, bilateral: Secondary | ICD-10-CM | POA: Diagnosis not present

## 2024-08-10 DIAGNOSIS — H11153 Pinguecula, bilateral: Secondary | ICD-10-CM | POA: Diagnosis not present

## 2024-08-10 DIAGNOSIS — Z961 Presence of intraocular lens: Secondary | ICD-10-CM | POA: Diagnosis not present

## 2024-08-10 DIAGNOSIS — H18003 Unspecified corneal deposit, bilateral: Secondary | ICD-10-CM | POA: Diagnosis not present

## 2024-08-10 DIAGNOSIS — H524 Presbyopia: Secondary | ICD-10-CM | POA: Diagnosis not present

## 2024-08-10 DIAGNOSIS — H04123 Dry eye syndrome of bilateral lacrimal glands: Secondary | ICD-10-CM | POA: Diagnosis not present

## 2024-08-13 ENCOUNTER — Ambulatory Visit: Payer: Self-pay | Admitting: Internal Medicine

## 2024-08-13 ENCOUNTER — Ambulatory Visit (HOSPITAL_COMMUNITY)
Admission: RE | Admit: 2024-08-13 | Discharge: 2024-08-13 | Disposition: A | Discharge status: Home / Self Care | Source: Ambulatory Visit | Attending: Cardiology | Admitting: Cardiology

## 2024-08-13 DIAGNOSIS — R001 Bradycardia, unspecified: Secondary | ICD-10-CM | POA: Diagnosis not present

## 2024-08-13 DIAGNOSIS — R002 Palpitations: Secondary | ICD-10-CM | POA: Diagnosis not present

## 2024-08-13 DIAGNOSIS — I472 Ventricular tachycardia, unspecified: Secondary | ICD-10-CM | POA: Diagnosis not present

## 2024-08-13 LAB — ECHOCARDIOGRAM COMPLETE
AR max vel: 0.57 cm2
AV Area VTI: 0.54 cm2
AV Area mean vel: 0.49 cm2
AV Mean grad: 10 mmHg
AV Peak grad: 15.1 mmHg
Ao pk vel: 1.94 m/s
Area-P 1/2: 5.46 cm2
MV VTI: 0.83 cm2
S' Lateral: 5.3 cm

## 2024-08-14 DIAGNOSIS — R001 Bradycardia, unspecified: Secondary | ICD-10-CM | POA: Diagnosis not present

## 2024-08-14 DIAGNOSIS — R002 Palpitations: Secondary | ICD-10-CM | POA: Diagnosis not present

## 2024-08-14 DIAGNOSIS — I472 Ventricular tachycardia, unspecified: Secondary | ICD-10-CM | POA: Diagnosis not present

## 2024-08-16 ENCOUNTER — Telehealth: Payer: Self-pay

## 2024-08-16 NOTE — Telephone Encounter (Signed)
   Cardiac Monitor Alert  Date of alert:  08/16/2024   Patient Name: Steve Riddle.  DOB: 18-Aug-1929  MRN: [id number]   [facility] Cardiologist: Soyla DELENA Merck, MD  [facility] EP:  OLE ONEIDA HOLTS, MD    Monitor Information: Cardiac Event Monitor [[employer]]  Reason:  Palpitations, Bradycardia, Ventricular Tachycardia Ordering provider:  Dr. Merck  Alert Atrial Fibrillation/Flutter This is the 1st alert for this rhythm.  The patient has a hx of Atrial Fibrillation/Flutter.  The patient is currently on anticoagulation.{  Anticoagulation medication as of 08/16/2024           warfarin (COUMADIN ) 3 MG tablet Take 1 tablet (3 mg total) by mouth daily at 4 PM. Follow up with repeat INR check 4/17.       Next Cardiology Appointment   Date:  09/28/24  Provider:  Dr. Merck  The patient was contacted today.  He is asymptomatic. Patient denies any missed doses of medications.  Arrhythmia, symptoms and history reviewed with DOD Dr. Anner, confirming atrial fibrillation on monitor strip.  Plan:  Continue to monitor.   Monitor alert to be scanned into chart.  Roxie LITTIE Louder, RN  08/16/2024 11:08 AM

## 2024-08-24 ENCOUNTER — Encounter: Payer: Self-pay | Admitting: Internal Medicine

## 2024-09-02 DIAGNOSIS — R972 Elevated prostate specific antigen [PSA]: Secondary | ICD-10-CM | POA: Diagnosis not present

## 2024-09-07 ENCOUNTER — Other Ambulatory Visit: Payer: Self-pay | Admitting: Cardiology

## 2024-09-09 DIAGNOSIS — R3912 Poor urinary stream: Secondary | ICD-10-CM | POA: Diagnosis not present

## 2024-09-09 DIAGNOSIS — N402 Nodular prostate without lower urinary tract symptoms: Secondary | ICD-10-CM | POA: Diagnosis not present

## 2024-09-09 DIAGNOSIS — N401 Enlarged prostate with lower urinary tract symptoms: Secondary | ICD-10-CM | POA: Diagnosis not present

## 2024-09-09 DIAGNOSIS — R972 Elevated prostate specific antigen [PSA]: Secondary | ICD-10-CM | POA: Diagnosis not present

## 2024-09-13 DIAGNOSIS — I5022 Chronic systolic (congestive) heart failure: Secondary | ICD-10-CM | POA: Diagnosis not present

## 2024-09-13 DIAGNOSIS — E0580 Other thyrotoxicosis without thyrotoxic crisis or storm: Secondary | ICD-10-CM | POA: Diagnosis not present

## 2024-09-13 DIAGNOSIS — Z125 Encounter for screening for malignant neoplasm of prostate: Secondary | ICD-10-CM | POA: Diagnosis not present

## 2024-09-13 DIAGNOSIS — E785 Hyperlipidemia, unspecified: Secondary | ICD-10-CM | POA: Diagnosis not present

## 2024-09-13 DIAGNOSIS — M109 Gout, unspecified: Secondary | ICD-10-CM | POA: Diagnosis not present

## 2024-09-13 DIAGNOSIS — I130 Hypertensive heart and chronic kidney disease with heart failure and stage 1 through stage 4 chronic kidney disease, or unspecified chronic kidney disease: Secondary | ICD-10-CM | POA: Diagnosis not present

## 2024-09-13 DIAGNOSIS — R7301 Impaired fasting glucose: Secondary | ICD-10-CM | POA: Diagnosis not present

## 2024-09-13 DIAGNOSIS — N184 Chronic kidney disease, stage 4 (severe): Secondary | ICD-10-CM | POA: Diagnosis not present

## 2024-09-14 ENCOUNTER — Ambulatory Visit: Attending: Internal Medicine

## 2024-09-14 DIAGNOSIS — R002 Palpitations: Secondary | ICD-10-CM

## 2024-09-14 DIAGNOSIS — I480 Paroxysmal atrial fibrillation: Secondary | ICD-10-CM

## 2024-09-14 DIAGNOSIS — R001 Bradycardia, unspecified: Secondary | ICD-10-CM

## 2024-09-14 DIAGNOSIS — I472 Ventricular tachycardia, unspecified: Secondary | ICD-10-CM

## 2024-09-20 DIAGNOSIS — Z0000 Encounter for general adult medical examination without abnormal findings: Secondary | ICD-10-CM | POA: Diagnosis not present

## 2024-09-20 DIAGNOSIS — Z7901 Long term (current) use of anticoagulants: Secondary | ICD-10-CM | POA: Diagnosis not present

## 2024-09-20 DIAGNOSIS — I2510 Atherosclerotic heart disease of native coronary artery without angina pectoris: Secondary | ICD-10-CM | POA: Diagnosis not present

## 2024-09-20 DIAGNOSIS — R82998 Other abnormal findings in urine: Secondary | ICD-10-CM | POA: Diagnosis not present

## 2024-09-20 DIAGNOSIS — I5022 Chronic systolic (congestive) heart failure: Secondary | ICD-10-CM | POA: Diagnosis not present

## 2024-09-20 DIAGNOSIS — I110 Hypertensive heart disease with heart failure: Secondary | ICD-10-CM | POA: Diagnosis not present

## 2024-09-20 DIAGNOSIS — Z23 Encounter for immunization: Secondary | ICD-10-CM | POA: Diagnosis not present

## 2024-09-20 DIAGNOSIS — N184 Chronic kidney disease, stage 4 (severe): Secondary | ICD-10-CM | POA: Diagnosis not present

## 2024-09-20 DIAGNOSIS — I480 Paroxysmal atrial fibrillation: Secondary | ICD-10-CM | POA: Diagnosis not present

## 2024-09-20 DIAGNOSIS — Z1339 Encounter for screening examination for other mental health and behavioral disorders: Secondary | ICD-10-CM | POA: Diagnosis not present

## 2024-09-20 DIAGNOSIS — Z1331 Encounter for screening for depression: Secondary | ICD-10-CM | POA: Diagnosis not present

## 2024-09-20 DIAGNOSIS — Z952 Presence of prosthetic heart valve: Secondary | ICD-10-CM | POA: Diagnosis not present

## 2024-09-20 DIAGNOSIS — R972 Elevated prostate specific antigen [PSA]: Secondary | ICD-10-CM | POA: Diagnosis not present

## 2024-09-20 DIAGNOSIS — I10 Essential (primary) hypertension: Secondary | ICD-10-CM | POA: Diagnosis not present

## 2024-09-28 ENCOUNTER — Encounter: Payer: Self-pay | Admitting: Internal Medicine

## 2024-09-28 ENCOUNTER — Ambulatory Visit: Attending: Internal Medicine | Admitting: Internal Medicine

## 2024-09-28 VITALS — BP 110/60 | HR 98 | Ht 64.0 in | Wt 181.0 lb

## 2024-09-28 DIAGNOSIS — I5020 Unspecified systolic (congestive) heart failure: Secondary | ICD-10-CM | POA: Insufficient documentation

## 2024-09-28 DIAGNOSIS — R002 Palpitations: Secondary | ICD-10-CM | POA: Insufficient documentation

## 2024-09-28 DIAGNOSIS — I472 Ventricular tachycardia, unspecified: Secondary | ICD-10-CM | POA: Insufficient documentation

## 2024-09-28 DIAGNOSIS — D6869 Other thrombophilia: Secondary | ICD-10-CM | POA: Diagnosis not present

## 2024-09-28 DIAGNOSIS — Z79899 Other long term (current) drug therapy: Secondary | ICD-10-CM | POA: Insufficient documentation

## 2024-09-28 DIAGNOSIS — I428 Other cardiomyopathies: Secondary | ICD-10-CM | POA: Diagnosis not present

## 2024-09-28 DIAGNOSIS — I5022 Chronic systolic (congestive) heart failure: Secondary | ICD-10-CM | POA: Diagnosis not present

## 2024-09-28 DIAGNOSIS — I4819 Other persistent atrial fibrillation: Secondary | ICD-10-CM | POA: Diagnosis not present

## 2024-09-28 DIAGNOSIS — Z952 Presence of prosthetic heart valve: Secondary | ICD-10-CM | POA: Diagnosis not present

## 2024-09-28 NOTE — Progress Notes (Signed)
 Cardiology Office Note:  .   Date:  09/28/2024  ID:  Steve Epifanio Raddle., DOB [birth year], MRN [id number] PCP: Yolande Toribio MATSU, MD  [facility] Providers Cardiologist:  Soyla DELENA Merck, MD Electrophysiologist:  OLE ONEIDA HOLTS, MD    History of Present Illness: .   Steve Mangual. is a 89 y.o. male.  Discussed the use of AI scribe software for clinical note transcription with the patient, who gave verbal consent to proceed.  History of Present Illness Steve Rehfeldt. is a 89 year old male with atrial fibrillation who presents for management of persistent atrial fibrillation and medication review. Persistent AF noted on recent cardiac monitor.  He experiences persistent atrial fibrillation, with a stable heart rate between 94 and 100 beats per minute. Amiodarone , used for five years, has recently become less effective. He ran out of his prescription, leading to its discontinuation two days ago. Since stopping amiodarone , coughing and phlegm production have decreased. He is on warfarin with INR values around 2.7 to 2.8 and takes metoprolol  12.5 mg, which he tolerates well. Discontinuation of metoprolol  previously caused a significant heart rate increase, requiring an ER visit. I have offered a cardioversion given persistent AF and therapeutic INR in setting of mechanical valves. He will review this option with EP after being off of amiodarone , per patient preference.  He reports leg weakness, hoping for improvement after stopping amiodarone . He has undergone cardioversion three times for atrial fibrillation management. A history of ventricular tachycardia contributed to the initiation of amiodarone  therapy. He is also on mexiletine. Amiodarone  discontinuation requires review by EP given indication for VT.    ROS: negative except per HPI above.  Studies Reviewed: .        Results LABS INR: 2.8 (09/17/2024)  DIAGNOSTIC ECG: Sinus tachycardia, atrial fibrillation, ventricular  tachycardia Risk Assessment/Calculations:    CHA2DS2-VASc Score = 4   This indicates a 4.8% annual risk of stroke. The patient's score is based upon: CHF History: 1 HTN History: 1 Diabetes History: 0 Stroke History: 0 Vascular Disease History: 0 Age Score: 2 Gender Score: 0      Physical Exam:   VS:  BP 110/60   Pulse 98   Ht 5' 4 (1.626 m)   Wt 181 lb (82.1 kg)   SpO2 97%   BMI 31.07 kg/m    Wt Readings from Last 3 Encounters:  09/28/24 181 lb (82.1 kg)  08/09/24 172 lb (78 kg)  11/06/23 200 lb (90.7 kg)     Physical Exam GENERAL: Alert, cooperative, well developed, no acute distress. HEENT: Normocephalic, normal oropharynx, moist mucous membranes. CHEST: Clear to auscultation bilaterally, no wheezes, rhonchi, or crackles. CARDIOVASCULAR: Normal heart rate and rhythm, S1 and S2 normal without murmurs. ABDOMEN: Soft, non-tender, non-distended, without organomegaly, normal bowel sounds. EXTREMITIES: No cyanosis or edema. NEUROLOGICAL: Cranial nerves grossly intact, moves all extremities without gross motor or sensory deficit.   ASSESSMENT AND PLAN: .    Assessment and Plan Assessment & Plan Persistent atrial fibrillation Persistent AFib with ineffective and adverse reaction to amiodarone . Symptoms improved post-discontinuation. Heart rate stable, INR well-managed. - Refer to EP for Afib and VT AAD management. - Consider cardioversion. I have offered this but given his age and no significant increase in symptoms with afib, just variable HR recordings, he has opted to observe. He would like to see if his non-cardiac symptoms improve off of amiodarone . Reasonable for now with close follow up.  -  Holding amiodarone  per patient preference. - Monitor  INR regularly in the event we decide to move forward with DCCV.  Ventricular tachycardia Ventricular tachycardia with no recent episodes, managed with mexiletine. - Follow-up with EP for management. - Continue mexiletine  200 mg TID.  Mechanical heart valves Mechanical heart valves functioning well with effective anticoagulation. - Continue warfarin therapy.  Lower extremity weakness Weakness possibly related to amiodarone , improved post-discontinuation. - Monitor for further improvement.  Recording duration: 18 minutes      Steve Draheim, MD, FACC

## 2024-09-28 NOTE — Patient Instructions (Addendum)
 Medication Instructions:  Your physician recommends that you continue on your current medications as directed. Please refer to the Current Medication list given to you today.  *If you need a refill on your cardiac medications before your next appointment, please call your pharmacy*  Lab Work: NONE If you have labs (blood work) drawn today and your tests are completely normal, you will receive your results only by: MyChart Message (if you have MyChart) OR A paper copy in the mail If you have any lab test that is abnormal or we need to change your treatment, we will call you to review the results.  Testing/Procedures: NONE  Follow-Up: At [facility], you and your health needs are our priority.  As part of our continuing mission to provide you with exceptional heart care, our providers are all part of one team.  This team includes your primary Cardiologist (physician) and Advanced Practice Providers or APPs (Physician Assistants and Nurse Practitioners) who all work together to provide you with the care you need, when you need it.  Your next appointment:   Next available  Provider:   Daphne Barrack, NP  Jodie Passey, PA-C Charlies Arthur, DEVONNA Norton, MD  6 months Loni, MD  We recommend signing up for the patient portal called MyChart.  Sign up information is provided on this After Visit Summary.  MyChart is used to connect with patients for Virtual Visits (Telemedicine).  Patients are able to view lab/test results, encounter notes, upcoming appointments, etc.  Non-urgent messages can be sent to your provider as well.   To learn more about what you can do with MyChart, go to [email or web address].

## 2024-10-12 ENCOUNTER — Telehealth: Payer: Self-pay | Admitting: Internal Medicine

## 2024-10-12 MED ORDER — METOPROLOL SUCCINATE ER 25 MG PO TB24: 25.0000 mg | ORAL_TABLET | Freq: Every day | ORAL | 3 refills | Status: AC

## 2024-10-12 NOTE — Telephone Encounter (Signed)
 Pt reports that he is on 12.5 mg metoprolol . He was taken off of Amiodarone  09/28/24  Reports that his hr has increased since stopping Amiodarone . Resting heart rate is about 100 and increases to about 110 when walks to the bathroom. Since heart rate has increased to 100's he has not felt very well. Just feels tired and shortness of breath has increased. He denies dizziness.   Asking if maybe he needs to increase Metoprolol  to 25 mg daily until his scheduled appointment with EP on 10/26/24? (With Charlies Arthur, PA)  Informed him that I would send this information to his provider and call him back with their response.   His preferred pharmacy is [facility] in [place].

## 2024-10-12 NOTE — Telephone Encounter (Signed)
 Loni Soyla LABOR, MD to [facility] Triage  Cindie Ole DASEN, MD (Selected Message)     10/12/24  3:18 PM Yes that should be fine, check BP daily to avoid hypotension. GA  S/w the patient and gave the information above. He verbalized understanding. RX sent to preferred pharmacy.

## 2024-10-12 NOTE — Telephone Encounter (Signed)
 Left message to call back.

## 2024-10-12 NOTE — Telephone Encounter (Signed)
 Pt is requesting a callback for medication management, did not provide any other details. Pt states the last time he called he did not get a call back. Please advise.

## 2024-10-26 ENCOUNTER — Encounter: Payer: Self-pay | Admitting: Cardiology

## 2024-10-26 ENCOUNTER — Ambulatory Visit: Attending: Physician Assistant | Admitting: Cardiology

## 2024-10-26 VITALS — BP 108/68 | Ht 64.0 in | Wt 182.0 lb

## 2024-10-26 DIAGNOSIS — I472 Ventricular tachycardia, unspecified: Secondary | ICD-10-CM | POA: Diagnosis not present

## 2024-10-26 DIAGNOSIS — R002 Palpitations: Secondary | ICD-10-CM | POA: Diagnosis not present

## 2024-10-26 DIAGNOSIS — Z952 Presence of prosthetic heart valve: Secondary | ICD-10-CM | POA: Insufficient documentation

## 2024-10-26 DIAGNOSIS — I5020 Unspecified systolic (congestive) heart failure: Secondary | ICD-10-CM | POA: Insufficient documentation

## 2024-10-26 DIAGNOSIS — Z79899 Other long term (current) drug therapy: Secondary | ICD-10-CM | POA: Diagnosis not present

## 2024-10-26 DIAGNOSIS — I4819 Other persistent atrial fibrillation: Secondary | ICD-10-CM | POA: Insufficient documentation

## 2024-10-26 DIAGNOSIS — I428 Other cardiomyopathies: Secondary | ICD-10-CM | POA: Insufficient documentation

## 2024-10-26 LAB — CBC

## 2024-10-26 MED ORDER — AMIODARONE HCL 200 MG PO TABS: 200.0000 mg | ORAL_TABLET | Freq: Every day | ORAL | 3 refills | Status: DC

## 2024-10-26 NOTE — Patient Instructions (Addendum)
 Medication Instructions:    START TAKING AMIODARONE  :  200 MG  TWICE  A DAY  FOR 3 WEEKS    THEN TAKE  AMIODARONE   200  MG ONCE  A DAY     *If you need a refill on your cardiac medications before your next appointment, please call your pharmacy*   Lab Work:  PLEASE GO DOWN STAIRS  [facility]  FIRST FLOOR   ( GET OFF ELEVATORS WALK TOWARDS WAITING AREA LAB LOCATED BY PHARMACY):  CMET  TSH CBC   FREE T4 AND FREE T3      If you have labs (blood work) drawn today and your tests are completely normal, you will receive your results only by: MyChart Message (if you have MyChart) OR A paper copy in the mail If you have any lab test that is abnormal or we need to change your treatment, we will call you to review the results.  Testing/Procedures:  NONE ORDERED  TODAY    Follow-Up: At [facility], you and your health needs are our priority.  As part of our continuing mission to provide you with exceptional heart care, our providers are all part of one team.  This team includes your primary Cardiologist (physician) and Advanced Practice Providers or APPs (Physician Assistants and Nurse Practitioners) who all work together to provide you with the care you need, when you need it.  Your next appointment:   7 week(s)  Provider:    Charlies Arthur, PA-C  ( CONTACT  CASSIE HALL/ ANGELINE HAMMER FOR EP SCHEDULING ISSUES )   We recommend signing up for the patient portal called MyChart.  Sign up information is provided on this After Visit Summary.  MyChart is used to connect with patients for Virtual Visits (Telemedicine).  Patients are able to view lab/test results, encounter notes, upcoming appointments, etc.  Non-urgent messages can be sent to your provider as well.   To learn more about what you can do with MyChart, go to [email or web address].   Other Instructions

## 2024-10-26 NOTE — Progress Notes (Signed)
 Electrophysiology Office Note:   Date:  10/26/2024  ID:  [name]., DOB February 13, 1929, MRN [id number]  Primary Cardiologist: Soyla DELENA Merck, MD Electrophysiologist: OLE ONEIDA HOLTS, MD   Electrophysiologist:  OLE ONEIDA HOLTS, MD      History of Present Illness:   Steve Riddle. is a 89 y.o. male with h/o valvular heart disease (mechanical AVR and MVR in 2005), atrial fibrillation and flutter (onset following valve surgery) s/p CTI ablation April 2022, NICM, HFrEF, VT, hypertension, hyperlipidemia, CKD seen today for acute visit to discuss recurrent afib and discontinuation of Amiodarone .  Patient's VT seen in October of 2021 when sick with COVID-19, myocarditis. Patient acutely managed with procainamide , amiodarone , beta blocker, ultimately continued on Mexiletine and Amiodarone . In April of 2023 patient with high grade strep gallolyticus bacteremia. Although patient has history of HFrEF and VT, patient felt not a candidate for transvenous device by Dr. Holts due to bacteremia hx and mechanical valves.   Patient seen by Dr. Merck on 08/09/24, reported increased frequency of palpitations and afib. Though patient had previously stopped Toprol  XL, he resumed after noticing these palpitations, noted improvement in HR on this. He wore a heart monitor that showed 100% afib burden, rare VE. Patient returned to see Dr. Merck on 09/28/24 where patient reported having discontinued Amiodarone  2 days prior to appointment after running out. Patient reported decrease in coughing and phlegm after discontinuation. Dr. Acharya offered to arranged DCCV but patient requested to discuss with EP first.   Today patient reports that overall he feels okay.  He denies acute symptoms such as chest pain, shortness of breath, dizziness, lightheadedness.  Also denies lower extremity edema, orthopnea, nocturnal dyspnea.  He continues to feel some exertional fatigue that he associates with heart rates over 100.   Today we spent several minutes discussing his amiodarone  prescription.  For unclear reasons it appears that refills were not provided to patient which led to the discontinuation of this back in October.  Patient denies typical amiodarone  side effects while taking and has not noted a significant change in how he feels while off of it.  He had previously told Dr. Acharya that he had noted a decrease in phlegm and throat clearing after stopping amiodarone .  We discussed this further today, and patient reports that he has struggled for quite some time with acid reflux.  Notes that the phlegm and throat clearing occurs with meals.  Patient reports that the symptoms did improve after stopping amiodarone  but that improvement also correlated with intentional weight loss following optimized thyroid  management.  He is not opposed to taking amiodarone , does admit that throat clearing is consistent with his prior acid reflux symptoms.    Review of systems complete and found to be negative unless listed in HPI.   EP Information / Studies Reviewed:    EKG is ordered today. Personal review as below.    EKG Interpretation Date/Time:  Tuesday October 26 2024 08:53:02 EST Ventricular Rate:  99 PR Interval:  130 QRS Duration:  112 QT Interval:  346 QTC Calculation: 444 R Axis:   32  Text Interpretation: Atrial flutter with 2 to 1 block Confirmed by Trudy Birmingham [phone]) on 10/26/2024 9:55:29 AM    Arrhythmia/Device History No specialty comments available.   Physical Exam:   VS:  BP 108/68   Ht 5' 4 (1.626 m)   Wt 182 lb (82.6 kg)   BMI 31.24 kg/m    Wt Readings from Last 3 Encounters:  10/26/24 182  lb (82.6 kg)  09/28/24 181 lb (82.1 kg)  08/09/24 172 lb (78 kg)     GEN: No acute distress NECK: No JVD; No carotid bruits CARDIAC: Regular rate and rhythm. Crisp mechanical valve sounds RESPIRATORY:  Clear to auscultation without rales, wheezing or rhonchi  ABDOMEN: Soft, non-tender,  non-distended EXTREMITIES:  No edema; No deformity   ASSESSMENT AND PLAN:    Persistent atrial fibrillation and atrial flutter Patient with prior CTI ablation for atrial flutter in 2022 recently found to be in A-fib at a clinic visit, subsequent 30-day event monitor showing 100% A-fib burden, average ventricular rate 93 bpm.  Shortly after this monitor was worn, patient ran out of his amiodarone  and has not taken any since.  Noted an increase in heart rate off amiodarone .  Patient referred back to EP by Dr. Loni to discuss arrhythmia management as well as amiodarone .  EKG and rhythm strip today most consistent with 2:1 atypical atrial flutter.  Patient feeling okay, notes that fatigue associated with arrhythmia seems to occur primarily with rates over 100 bpm, feels much better with rates in the 90s or less.  Discussed cardioversion with patient, given his age, reasonable to attempt more conservative management/rate control.  Noted improvement in phlegm and throat clearing correlates with weight loss and symptomatically consistent with acid reflux rather than Amiodarone  side effect. Reload amiodarone : 200 mg twice daily x 3 weeks then 200 mg daily.  Check complete metabolic panel, CBC, TSH and free T4 today. Continue Toprol -XL 25 mg once daily Emphasized compliance with warfarin  Ventricular tachycardia Patient's current symptoms are not suggestive of recurrent ventricular arrhythmia.  Recent 30-day event monitor with less than 1% PVC burden, only 1 episode of ventricular tachycardia limited to 3 beats.  Rhythm strip today with 1 isolated PVC. Reload amiodarone  as above Continue mexiletine 200 mg 3 times daily  Chronic Systolic CHF August 2025 echocardiogram with essentially stable LVEF of 25 to 30%.  Patient appears euvolemic today. GDMT limited by patient's chronic kidney disease.  Continue to follow-up with Dr. Acharya. Continue Toprol -XL 25 mg as above     Follow up with EP APP 6-8  weeks   Signed, Artist Pouch, PA-C

## 2024-10-27 ENCOUNTER — Ambulatory Visit: Payer: Self-pay | Admitting: Cardiology

## 2024-10-27 LAB — COMPREHENSIVE METABOLIC PANEL WITH GFR
ALT: 10 IU/L (ref 0–44)
AST: 27 IU/L (ref 0–40)
Albumin: 4.1 g/dL (ref 3.6–4.6)
Alkaline Phosphatase: 88 IU/L (ref 48–129)
BUN/Creatinine Ratio: 15 (ref 10–24)
BUN: 26 mg/dL (ref 10–36)
Bilirubin Total: 0.5 mg/dL (ref 0.0–1.2)
CO2: 20 mmol/L (ref 20–29)
Calcium: 10 mg/dL (ref 8.6–10.2)
Chloride: 105 mmol/L (ref 96–106)
Creatinine, Ser: 1.79 mg/dL — ABNORMAL HIGH (ref 0.76–1.27)
Globulin, Total: 2.4 g/dL (ref 1.5–4.5)
Glucose: 88 mg/dL (ref 70–99)
Potassium: 4.7 mmol/L (ref 3.5–5.2)
Sodium: 142 mmol/L (ref 134–144)
Total Protein: 6.5 g/dL (ref 6.0–8.5)
eGFR: 35 mL/min/1.73 — ABNORMAL LOW (ref >59)

## 2024-10-27 LAB — CBC
Hematocrit: 36.9 % — AB (ref 37.5–51.0)
Hemoglobin: 12.3 g/dL — AB (ref 13.0–17.7)
MCH: 34.2 pg — AB (ref 26.6–33.0)
MCHC: 33.3 g/dL (ref 31.5–35.7)
MCV: 103 fL — AB (ref 79–97)
Platelets: 185 x10E3/uL (ref 150–450)
RBC: 3.6 x10E6/uL — AB (ref 4.14–5.80)
RDW: 13.6 % (ref 11.6–15.4)
WBC: 7.9 x10E3/uL (ref 3.4–10.8)

## 2024-10-27 LAB — THYROID PANEL WITH TSH
Free Thyroxine Index: 3.8 (ref 1.2–4.9)
T3 Uptake Ratio: 36 % (ref 24–39)
T4, Total: 10.6 ug/dL (ref 4.5–12.0)
TSH: 1.09 u[IU]/mL (ref 0.450–4.500)

## 2024-11-01 DIAGNOSIS — I480 Paroxysmal atrial fibrillation: Secondary | ICD-10-CM | POA: Diagnosis not present

## 2024-11-01 DIAGNOSIS — Z7901 Long term (current) use of anticoagulants: Secondary | ICD-10-CM | POA: Diagnosis not present

## 2024-11-01 DIAGNOSIS — Z952 Presence of prosthetic heart valve: Secondary | ICD-10-CM | POA: Diagnosis not present

## 2024-11-08 ENCOUNTER — Other Ambulatory Visit: Payer: Self-pay

## 2024-11-09 MED ORDER — MEXILETINE HCL 200 MG PO CAPS: 200.0000 mg | ORAL_CAPSULE | Freq: Three times a day (TID) | ORAL | 1 refills | Status: DC

## 2024-11-17 DIAGNOSIS — I480 Paroxysmal atrial fibrillation: Secondary | ICD-10-CM | POA: Diagnosis not present

## 2024-11-17 DIAGNOSIS — I472 Ventricular tachycardia, unspecified: Secondary | ICD-10-CM | POA: Diagnosis not present

## 2024-11-17 DIAGNOSIS — R002 Palpitations: Secondary | ICD-10-CM

## 2024-11-17 DIAGNOSIS — R001 Bradycardia, unspecified: Secondary | ICD-10-CM | POA: Diagnosis not present

## 2024-12-12 NOTE — Progress Notes (Unsigned)
 "    Cardiology Office Note Date:  12/12/2024  Patient ID:  Steve Grider., DOB [birth year], MRN [id number] PCP:  Yolande Toribio MATSU, MD  Cardiologist:  Dr. Loni (previously [facility], Dr. Carvin) Electrophysiologist: Dr. Cindie    Chief Complaint:   *** 7 week f/u  History of Present Illness: Steve Kiper. is a 89 y.o. male with history of  HTN, HLD, CKD (IV) VHD (mechanical AVR/MVR 2005),  AFib/AFlutter (dates back to his valve surgery),  NICM, chronic CHF (systolic),  VT  Oct 2021, COVID infection, myocarditis, VT treated with procainamide , amiodarone , and beta blocker.  continued on mexiletine, amiodarone   [facility] ablation 03/2021 at [facility]   April 2023, bacteremia/(?presumed) endocarditis (high grade strep gallolyticus bacteremia)  EF has waxed/waned, consideration for device discussed though with hx of bacteremia/(presumed?) endocarditis with mechanical valves, transvenous device (including pacer with some mention of bradycardia as well) has been avoided  Had been following at [facility] though transitioned to [facility] for local care/management.  He saw Dr. Cindie 07/08/22, stable without symptoms of arrhythmia, AAD continued, planned for 6 mo visits with EP.  Agreed not a candidate for ICD implant given bacteremia hx.  Seeing myself, Dr. Cindie, Dr. Loni over the years  He has developed hyperthyroid, being managed with his PMD, in my discussion with Dr. Cindie, treat thyroid , continue amiodarone  Not an ICD candidate given hx of bacteremia, advanced age  I saw him last Aug 2024, doing well, poor balance, had some falls, urged walking aids, caution  Seeing Dr. Leila a few times, last on 09/28/24, discussed monitor with persistent Afib, had run out of Amiodarone . Offered DCCV, recommended EP visit to review amiodarone  yes/no and DCCV yes/no. Mentioned cough/phlegm was better off amio  He saw Artist 10/26/24, elicited that he had been out of amiodarone  since  October. Cough, throat symptoms suspect to be driven by GERD, this improved with some weight loss, coincidentally timing off amio He was in a 2:1 AFlutter at his visit Reports symptoms of fatigue when his rates > 100 Planned to reload amiodarone  200 mg twice daily x 3 weeks then 200 mg daily.   TODAY  *** amio labs *** cough? *** rhythm, INRs, DCCV? *** symptoms, AFib, VT *** mexiletine, amio    Past Medical History:  Diagnosis Date   Anemia    Arthritis    Atrial fibrillation (HCC)    Diverticulosis    Endocarditis    GI bleed    Gout    Heart murmur    Hx of adenomatous colonic polyps    Hyperlipemia    Hyperparathyroidism    Hypertension    Nephrosclerosis    Sepsis (HCC)     Past Surgical History:  Procedure Laterality Date   AORTIC AND MITRAL VALVE REPLACEMENT     CATARACT EXTRACTION Left    with lens implant   CORONARY ARTERY BYPASS GRAFT     MITRAL VALVE REPLACEMENT     ORIF ARM     TEE WITHOUT CARDIOVERSION N/A 04/03/2022   Procedure: TRANSESOPHAGEAL ECHOCARDIOGRAM (TEE);  Surgeon: Hobart Powell BRAVO, MD;  Location: [facility] ENDOSCOPY;  Service: Cardiovascular;  Laterality: N/A;    Current Outpatient Medications  Medication Sig Dispense Refill   acetaminophen  (TYLENOL ) 500 MG tablet Take 500 mg by mouth See admin instructions. 2- 3 times a day     allopurinol  (ZYLOPRIM ) 100 MG tablet Take 200 mg by mouth daily.     amiodarone  (PACERONE ) 200 MG tablet Take 1 tablet (200  mg total) by mouth daily. 90 tablet 3   bumetanide  (BUMEX ) 2 MG tablet Take 1 tablet (2 mg total) by mouth 2 (two) times daily.     calcitRIOL  (ROCALTROL ) 0.5 MCG capsule Take 0.5 mcg by mouth every other day.     calcium carbonate (TUMS - DOSED IN MG ELEMENTAL CALCIUM) 500 MG chewable tablet Chew 1-2 tablets by mouth as needed for indigestion or heartburn.     cetirizine (ZYRTEC) 10 MG tablet Take 10 mg by mouth at bedtime.     diclofenac Sodium (VOLTAREN) 1 % GEL Apply 2 g topically 4 (four)  times daily as needed (to painful sites).     diphenhydrAMINE (BENADRYL) 25 MG tablet Take 25 mg by mouth at bedtime as needed (FOR BEE STING-RELATED ALLERGIC REACTIONS).     Glucosamine Sulfate 1000 MG TABS Take 1 tablet by mouth every evening.     levothyroxine (SYNTHROID) 25 MCG tablet Take 25 mcg by mouth.     methimazole (TAPAZOLE) 5 MG tablet Take 15 mg by mouth daily. TAKE 1 TABLET IN THE MORNING AND 2  TABLETAT NIGHT TIME     metoprolol  succinate (TOPROL -XL) 25 MG 24 hr tablet Take 1 tablet (25 mg total) by mouth daily. 90 tablet 3   mexiletine (MEXITIL ) 200 MG capsule Take 1 capsule (200 mg total) by mouth 3 (three) times daily. 90 capsule 1   Multiple Vitamins-Minerals (CENTRUM SILVER 50+MEN) TABS Take 1 tablet by mouth daily with breakfast.     omeprazole  (PRILOSEC) 20 MG capsule Take 1 capsule (20 mg total) by mouth daily. 30 capsule 11   potassium chloride  SA (KLOR-CON  M) 20 MEQ tablet Take 1 tablet (20 mEq total) by mouth daily. (Patient taking differently: Take 20 mEq by mouth 2 (two) times daily.) 30 tablet 0   ROLAIDS 550-110 MG CHEW Chew 1-2 tablets by mouth as needed (for heartburn).     simethicone  (MYLICON) 125 MG chewable tablet Chew 125 mg by mouth every 6 (six) hours as needed for flatulence.     tamsulosin  (FLOMAX ) 0.4 MG CAPS capsule Take 0.8 mg by mouth at bedtime.     warfarin (COUMADIN ) 3 MG tablet Take 1 tablet (3 mg total) by mouth daily at 4 PM. Follow up with repeat INR check 4/17. 30 tablet 0   No current facility-administered medications for this visit.    Allergies:   Bee venom, Ace inhibitors, Atorvastatin, Pravastatin , and Ranexa [ranolazine]   Social History:  The patient  reports that he has quit smoking. He has never used smokeless tobacco. He reports that he does not drink alcohol  and does not use drugs.   Family History:  The patient's family history includes Breast cancer in his daughter; Hypertension in his father and mother; Liver cancer (age of  onset: 53) in his brother; Stroke in his mother; Uterine cancer in his daughter.  ROS:  Please see the history of present illness.    All other systems are reviewed and otherwise negative.   PHYSICAL EXAM:  VS:  There were no vitals taken for this visit. BMI: There is no height or weight on file to calculate BMI. Well nourished, well developed, in no acute distress HEENT: normocephalic, atraumatic Neck: no JVD, carotid bruits or masses Cardiac: *** RRR; valves appreciated, no rubs, or gallops Lungs: *** CTA b/l, no wheezing, rhonchi or rales Abd: soft, nontender MS: no deformity or atrophy Ext: *** b/l ankle edema L>R trace-1+ (reported as chronic and unchanged) Skin: warm and dry,  no rash Neuro:  No gross deficits appreciated Psych: euthymic mood, full affect   EKG:  done today and reviewed by myself ***   08/13/24: TTE 1. Left ventricular ejection fraction, by estimation, is 25 to 30%. The  left ventricle has severely decreased function. The left ventricle  demonstrates regional wall motion abnormalities (see scoring  diagram/findings for description). The left  ventricular internal cavity size was mildly dilated. There is mild  asymmetric left ventricular hypertrophy of the basal-septal segment. Left  ventricular diastolic function could not be evaluated.   2. Right ventricular systolic function is moderately reduced. The right  ventricular size is moderately enlarged. There is normal pulmonary artery  systolic pressure. The estimated right ventricular systolic pressure is  30.0 mmHg.   3. Left atrial size was severely dilated.   4. Right atrial size was mildly dilated.   5. 29 mm Mechanical MV. Emax 1.1 m/s, MG 4.0 mmHG @ 93 bpm, EOA 0.83 cm2  (underestimated due to incorrect LVOT PW placement). The mitral valve has  been repaired/replaced. No evidence of mitral valve regurgitation. There  is a 29 mm mechanical valve  present in the mitral position. Procedure Date: 2005.  Echo findings are  consistent with normal structure and function of the mitral valve  prosthesis.   6. 21 mm Mechanical AoV. Vmax 1.9 m/s, MG 10 mmHG, EOA 0.54 cm2 (lower  than suspected due to LVOT PW signal obtained incorrectly). The aortic  valve has been repaired/replaced. Aortic valve regurgitation is not  visualized. There is a 21 mm mechanical  valve present in the aortic position. Procedure Date: 2005.   7. There is mild dilatation of the ascending aorta, measuring 43 mm.   8. The inferior vena cava is normal in size with greater than 50%  respiratory variability, suggesting right atrial pressure of 3 mmHg.   Comparison(s): No significant change from prior study.    11/29/22: TTE 1. Left ventricular ejection fraction, by estimation, is 30 to 35%. The  left ventricle has moderately decreased function. The left ventricle  demonstrates regional wall motion abnormalities (see scoring  diagram/findings for description). The left  ventricular internal cavity size was mildly dilated. There is mild  concentric left ventricular hypertrophy. Left ventricular diastolic  function could not be evaluated. There is akinesis of the left  ventricular, basal-mid inferior wall and inferolateral  wall.   2. Right ventricular systolic function is low normal. The right  ventricular size is normal. There is normal pulmonary artery systolic  pressure. The estimated right ventricular systolic pressure is 22.9 mmHg.   3. Left atrial size was severely dilated.   4. Right atrial size was severely dilated.   5. The mitral valve has been repaired/replaced. No evidence of mitral  valve regurgitation. Mild mitral stenosis. The mean mitral valve gradient  is 6.8 mmHg with average heart rate of 85 bpm. There is a 29 mm St[employer] Jude  mechanical valve present in the  mitral position.   6. The aortic valve has been repaired/replaced. Aortic valve  regurgitation is trivial. There is a 21 mm St. Jude mechanical  valve  present in the aortic position. Echo findings are consistent with normal  structure and function of the aortic valve  prosthesis. Aortic valve mean gradient measures 12.0 mmHg. Aortic valve  Vmax measures 2.46 m/s.   7. There is mild dilatation of the ascending aorta, measuring 43 mm.   8. The inferior vena cava is normal in size with greater than 50%  respiratory variability, suggesting right atrial pressure of 3 mmHg.   Comparison(s): A prior study was performed on 03/28/2022. Prior images  reviewed side by side. The left ventricular function is unchanged.  Retrospectively, LVEF may have been underestimated on the previous TTE.  Mitral gradients have increased slowly over  the last several years and there is now mild mitral stenosis.    04/03/22: TEE 1. No valvular vegetations visualized.   2. Left ventricular ejection fraction, by estimation, is 25 to 30%. The  left ventricle has severely decreased function. The left ventricular  internal cavity size was moderately dilated.   3. Right ventricular systolic function is mildly reduced. The right  ventricular size is normal.   4. The mitral valve has been repaired/replaced. There is a 29 mm St. Jude  mechanical valve present in the mitral position. Procedure Date: 2005.  Echo findings are consistent with normal structure and function of the  mitral valve prosthesis. There are  normal washing jets with trivial mitral regurgitation. Mean gradient  at HR 88bpm, MVA 3.2cm2 by continuity.   5. The aortic valve has been repaired/replaced. Aortic valve  regurgitation is not visualized. There is a 21 mm St. Jude valve present  in the aortic position. Procedure Date: 2005. Aortic valve mean gradient  measures 12.0 mmHg. Aortic valve Vmax measures   2.43 m/s. DI 0.35.   6. Left atrial size was severely dilated. No left atrial/left atrial  appendage thrombus was detected.   7. Right atrial size was mildly dilated.   8. Aortic  dilatation noted. There is borderline dilatation of the aortic  root, measuring 38 mm. There is Moderate (Grade III) plaque.   Recent Labs: 10/26/2024: ALT 10; BUN 26; Creatinine, Ser 1.79; Hemoglobin 12.3; Platelets 185; Potassium 4.7; Sodium 142; TSH 1.090  No results found for requested labs within last 365 days.   CrCl cannot be calculated (Patient's most recent lab result is older than the maximum 21 days allowed.).   Wt Readings from Last 3 Encounters:  10/26/24 182 lb (82.6 kg)  09/28/24 181 lb (82.1 kg)  08/09/24 172 lb (78 kg)     Other studies reviewed: Additional studies/records reviewed today include: summarized above  ASSESSMENT AND PLAN:  NICM Chronic CHF *** Appears compensated currently C/w Dr. Loni and team CKD limits GDMT   VT No symptoms to suggest recurrent arrhythmia On mexiletine/amiodarone  not a candidate for ICD given hx of bacteremia Advanced age  Hyperthyroidism started on methimazole > PMD is managing In prior  d/w Dr. Cindie, planned for ongoing amiodarone  and thyroid  management with PMD   Paroxysmal AFib/flutter CHA2DS2Vasc is 4 Low/no burden by symptoms Chronic amiodarone  Warfarin is managed by his PMD *** Falls, leg weakness, not new > progressing as he ages *** Urged cane/walker use, care, his son helps him in/out of the golf cart now   VHD Mechanical AVR/MVR Stable function by his last echo On warfarin C/w Dr. Loni   Secondary hypercoagulable state Warfarin is monitored and managed by his PMD    Disposition: we can have him back in ***, sooner if needed  Current medicines are reviewed at length with the patient today.  The patient did not have any concerns regarding medicines.  Bonney Charlies Arthur, PA-C 12/12/2024 5:23 PM     [facility] [address] [place] [address] [phone] (office)  [phone] (fax)   "

## 2024-12-14 ENCOUNTER — Ambulatory Visit: Admitting: Physician Assistant

## 2024-12-14 VITALS — BP 126/76 | HR 88 | Ht 64.0 in | Wt 183.0 lb

## 2024-12-14 DIAGNOSIS — I5022 Chronic systolic (congestive) heart failure: Secondary | ICD-10-CM | POA: Insufficient documentation

## 2024-12-14 DIAGNOSIS — E0590 Thyrotoxicosis, unspecified without thyrotoxic crisis or storm: Secondary | ICD-10-CM | POA: Diagnosis present

## 2024-12-14 DIAGNOSIS — I480 Paroxysmal atrial fibrillation: Secondary | ICD-10-CM | POA: Insufficient documentation

## 2024-12-14 DIAGNOSIS — I4892 Unspecified atrial flutter: Secondary | ICD-10-CM | POA: Diagnosis present

## 2024-12-14 DIAGNOSIS — D6869 Other thrombophilia: Secondary | ICD-10-CM | POA: Diagnosis not present

## 2024-12-14 DIAGNOSIS — I472 Ventricular tachycardia, unspecified: Secondary | ICD-10-CM | POA: Insufficient documentation

## 2024-12-14 DIAGNOSIS — Z952 Presence of prosthetic heart valve: Secondary | ICD-10-CM | POA: Diagnosis not present

## 2024-12-14 MED ORDER — AMIODARONE HCL 200 MG PO TABS: 200.0000 mg | ORAL_TABLET | Freq: Every day | ORAL | 3 refills | Status: AC

## 2024-12-14 NOTE — Patient Instructions (Signed)
 Medication Instructions:   Your physician recommends that you continue on your current medications as directed. Please refer to the Current Medication list given to you today.  *If you need a refill on your cardiac medications before your next appointment, please call your pharmacy*  Lab Work:   PLEASE GO DOWN STAIRS  [facility]  FIRST FLOOR   ( GET OFF ELEVATORS WALK TOWARDS WAITING AREA LAB LOCATED BY PHARMACY):  LIVER  PANEL     If you have labs (blood work) drawn today and your tests are completely normal, you will receive your results only by: MyChart Message (if you have MyChart) OR A paper copy in the mail If you have any lab test that is abnormal or we need to change your treatment, we will call you to review the results.  Testing/Procedures: NONE ORDERED  TODAY    Follow-Up: At [facility], you and your health needs are our priority.  As part of our continuing mission to provide you with exceptional heart care, our providers are all part of one team.  This team includes your primary Cardiologist (physician) and Advanced Practice Providers or APPs (Physician Assistants and Nurse Practitioners) who all work together to provide you with the care you need, when you need it.  Your next appointment:   6 month(s)   Provider:   Donnice Primus, MD    We recommend signing up for the patient portal called MyChart.  Sign up information is provided on this After Visit Summary.  MyChart is used to connect with patients for Virtual Visits (Telemedicine).  Patients are able to view lab/test results, encounter notes, upcoming appointments, etc.  Non-urgent messages can be sent to your provider as well.   To learn more about what you can do with MyChart, go to [email or web address].   Other Instructions

## 2024-12-15 ENCOUNTER — Ambulatory Visit: Payer: Self-pay | Admitting: Physician Assistant

## 2024-12-15 LAB — HEPATIC FUNCTION PANEL
ALT: 14 IU/L (ref 0–44)
AST: 23 IU/L (ref 0–40)
Albumin: 4.1 g/dL (ref 3.6–4.6)
Alkaline Phosphatase: 104 IU/L (ref 48–129)
Bilirubin Total: 0.4 mg/dL (ref 0.0–1.2)
Bilirubin, Direct: 0.16 mg/dL (ref 0.00–0.40)
Total Protein: 6.3 g/dL (ref 6.0–8.5)

## 2025-01-10 ENCOUNTER — Other Ambulatory Visit: Payer: Self-pay | Admitting: Cardiology

## 2025-01-12 ENCOUNTER — Other Ambulatory Visit: Payer: Self-pay | Admitting: Physician Assistant

## 2025-01-13 ENCOUNTER — Telehealth: Payer: Self-pay | Admitting: Student in an Organized Health Care Education/Training Program

## 2025-01-13 NOTE — Telephone Encounter (Signed)
" °*  STAT* If patient is at the pharmacy, call can be transferred to refill team.   1. Which medications need to be refilled? (please list name of each medication and dose if known)  mexiletine (MEXITIL ) 200 MG capsule 2. Which pharmacy/location (including street and city if local pharmacy) is medication to be sent to? [id number] - [place], [place] - [address]  3. Do they need a 30 day or 90 day supply? 90 day   Pt last OV 11/2024. Pt is out of medication  "

## 2025-01-14 NOTE — Telephone Encounter (Signed)
 Patient is calling back for update on medication. Please see below

## 2025-01-17 MED ORDER — MEXILETINE HCL 200 MG PO CAPS: 200.0000 mg | ORAL_CAPSULE | Freq: Three times a day (TID) | ORAL | 6 refills | Status: DC

## 2025-01-18 MED ORDER — MEXILETINE HCL 200 MG PO CAPS: 200.0000 mg | ORAL_CAPSULE | Freq: Three times a day (TID) | ORAL | 6 refills | Status: AC
